# Patient Record
Sex: Male | Born: 1955 | Race: White | Hispanic: No | Marital: Single | Smoking: Current every day smoker
Health system: Southern US, Community
[De-identification: ages and names within clinical notes are randomized; demographics above are authoritative.]

## PROBLEM LIST (undated history)

## (undated) DIAGNOSIS — N179 Acute kidney failure, unspecified: Secondary | ICD-10-CM

## (undated) DIAGNOSIS — F209 Schizophrenia, unspecified: Secondary | ICD-10-CM

## (undated) DIAGNOSIS — E871 Hypo-osmolality and hyponatremia: Secondary | ICD-10-CM

## (undated) DIAGNOSIS — E876 Hypokalemia: Secondary | ICD-10-CM

## (undated) DIAGNOSIS — K5900 Constipation, unspecified: Secondary | ICD-10-CM

## (undated) DIAGNOSIS — I10 Essential (primary) hypertension: Secondary | ICD-10-CM

## (undated) DIAGNOSIS — Z9119 Patient's noncompliance with other medical treatment and regimen: Secondary | ICD-10-CM

## (undated) DIAGNOSIS — N400 Enlarged prostate without lower urinary tract symptoms: Secondary | ICD-10-CM

## (undated) DIAGNOSIS — J449 Chronic obstructive pulmonary disease, unspecified: Secondary | ICD-10-CM

## (undated) DIAGNOSIS — R0600 Dyspnea, unspecified: Secondary | ICD-10-CM

## (undated) HISTORY — PX: TONSILLECTOMY: SUR1361

---

## 2014-03-31 LAB — COMPREHENSIVE METABOLIC PANEL
ALK PHOS: 112 U/L
ALT: 25 U/L
Albumin: 3.3 g/dL — ABNORMAL LOW (ref 3.4–5.0)
Anion Gap: 7 (ref 7–16)
BUN: 14 mg/dL (ref 7–18)
Bilirubin,Total: 0.4 mg/dL (ref 0.2–1.0)
CALCIUM: 8.9 mg/dL (ref 8.5–10.1)
CHLORIDE: 109 mmol/L — AB (ref 98–107)
Co2: 29 mmol/L (ref 21–32)
Creatinine: 1.11 mg/dL (ref 0.60–1.30)
EGFR (African American): >60
EGFR (Non-African Amer.): >60
Glucose: 110 mg/dL — ABNORMAL HIGH (ref 65–99)
Osmolality: 290 (ref 275–301)
Potassium: 3.6 mmol/L (ref 3.5–5.1)
SGOT(AST): 25 U/L (ref 15–37)
Sodium: 145 mmol/L (ref 136–145)
Total Protein: 7 g/dL (ref 6.4–8.2)

## 2014-03-31 LAB — ETHANOL: Ethanol %: <0.003 % (ref 0.000–0.080)

## 2014-03-31 LAB — CBC
HCT: 46 % (ref 40.0–52.0)
HGB: 15.1 g/dL (ref 13.0–18.0)
MCH: 31.2 pg (ref 26.0–34.0)
MCHC: 32.9 g/dL (ref 32.0–36.0)
MCV: 95 fL (ref 80–100)
Platelet: 295 10*3/uL (ref 150–440)
RBC: 4.86 10*6/uL (ref 4.40–5.90)
RDW: 12.9 % (ref 11.5–14.5)
WBC: 10.2 10*3/uL (ref 3.8–10.6)

## 2014-03-31 LAB — ACETAMINOPHEN LEVEL: Acetaminophen: <2 ug/mL

## 2014-03-31 LAB — SALICYLATE LEVEL: Salicylates, Serum: 5.2 mg/dL — ABNORMAL HIGH

## 2014-04-01 ENCOUNTER — Inpatient Hospital Stay: Payer: Self-pay | Admitting: Psychiatry

## 2014-04-01 LAB — URINALYSIS, COMPLETE
BACTERIA: NONE SEEN
Bilirubin,UR: NEGATIVE
Blood: NEGATIVE
Glucose,UR: NEGATIVE mg/dL (ref 0–75)
Ketone: NEGATIVE
Leukocyte Esterase: NEGATIVE
Nitrite: NEGATIVE
PROTEIN: NEGATIVE
Ph: 7 (ref 4.5–8.0)
Specific Gravity: 1.006 (ref 1.003–1.030)
Squamous Epithelial: <1
WBC UR: NONE SEEN /HPF (ref 0–5)

## 2014-04-01 LAB — DRUG SCREEN, URINE
Amphetamines, Ur Screen: NEGATIVE (ref ?–1000)
BENZODIAZEPINE, UR SCRN: NEGATIVE (ref ?–200)
Barbiturates, Ur Screen: NEGATIVE (ref ?–200)
Cannabinoid 50 Ng, Ur ~~LOC~~: NEGATIVE (ref ?–50)
Cocaine Metabolite,Ur ~~LOC~~: NEGATIVE (ref ?–300)
MDMA (ECSTASY) UR SCREEN: NEGATIVE (ref ?–500)
Methadone, Ur Screen: NEGATIVE (ref ?–300)
OPIATE, UR SCREEN: NEGATIVE (ref ?–300)
Phencyclidine (PCP) Ur S: NEGATIVE (ref ?–25)
Tricyclic, Ur Screen: NEGATIVE (ref ?–1000)

## 2014-04-06 LAB — LIPID PANEL
Cholesterol: 120 mg/dL (ref 0–200)
HDL Cholesterol: 37 mg/dL — ABNORMAL LOW (ref 40–60)
Ldl Cholesterol, Calc: 47 mg/dL (ref 0–100)
Triglycerides: 180 mg/dL (ref 0–200)
VLDL Cholesterol, Calc: 36 mg/dL (ref 5–40)

## 2014-04-06 LAB — HEMOGLOBIN A1C: Hemoglobin A1C: 5.1 % (ref 4.2–6.3)
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
## 2017-07-16 ENCOUNTER — Emergency Department (HOSPITAL_COMMUNITY)
Admission: EM | Admit: 2017-07-16 | Discharge: 2017-07-16 | Disposition: A | Discharge status: Home / Self Care | Payer: Medicaid Other | Attending: Emergency Medicine | Admitting: Emergency Medicine

## 2017-07-16 ENCOUNTER — Encounter (HOSPITAL_COMMUNITY): Payer: Self-pay | Admitting: *Deleted

## 2017-07-16 DIAGNOSIS — E876 Hypokalemia: Secondary | ICD-10-CM | POA: Diagnosis not present

## 2017-07-16 DIAGNOSIS — I10 Essential (primary) hypertension: Secondary | ICD-10-CM | POA: Diagnosis not present

## 2017-07-16 DIAGNOSIS — F17200 Nicotine dependence, unspecified, uncomplicated: Secondary | ICD-10-CM | POA: Insufficient documentation

## 2017-07-16 DIAGNOSIS — F209 Schizophrenia, unspecified: Secondary | ICD-10-CM | POA: Insufficient documentation

## 2017-07-16 DIAGNOSIS — R4689 Other symptoms and signs involving appearance and behavior: Secondary | ICD-10-CM | POA: Diagnosis present

## 2017-07-16 DIAGNOSIS — Z79899 Other long term (current) drug therapy: Secondary | ICD-10-CM | POA: Diagnosis not present

## 2017-07-16 HISTORY — DX: Essential (primary) hypertension: I10

## 2017-07-16 HISTORY — DX: Schizophrenia, unspecified: F20.9

## 2017-07-16 LAB — CBC WITH DIFFERENTIAL/PLATELET
BASOS PCT: 1 %
Basophils Absolute: 0.1 10*3/uL (ref 0.0–0.1)
Eosinophils Absolute: 0 10*3/uL (ref 0.0–0.7)
Eosinophils Relative: 0 %
HEMATOCRIT: 39 % (ref 39.0–52.0)
HEMOGLOBIN: 13.3 g/dL (ref 13.0–17.0)
LYMPHS ABS: 1.4 10*3/uL (ref 0.7–4.0)
Lymphocytes Relative: 15 %
MCH: 33.5 pg (ref 26.0–34.0)
MCHC: 34.1 g/dL (ref 30.0–36.0)
MCV: 98.2 fL (ref 78.0–100.0)
MONOS PCT: 12 %
Monocytes Absolute: 1.1 10*3/uL — ABNORMAL HIGH (ref 0.1–1.0)
NEUTROS ABS: 6.7 10*3/uL (ref 1.7–7.7)
Neutrophils Relative %: 72 %
Platelets: 204 10*3/uL (ref 150–400)
RBC: 3.97 MIL/uL — ABNORMAL LOW (ref 4.22–5.81)
RDW: 13.2 % (ref 11.5–15.5)
WBC: 9.3 10*3/uL (ref 4.0–10.5)

## 2017-07-16 LAB — COMPREHENSIVE METABOLIC PANEL
ALBUMIN: 2.8 g/dL — AB (ref 3.5–5.0)
ALK PHOS: 96 U/L (ref 38–126)
ALT: 21 U/L (ref 17–63)
ANION GAP: 11 (ref 5–15)
AST: 29 U/L (ref 15–41)
BILIRUBIN TOTAL: 0.4 mg/dL (ref 0.3–1.2)
BUN: 24 mg/dL — ABNORMAL HIGH (ref 6–20)
CALCIUM: 8.1 mg/dL — AB (ref 8.9–10.3)
CO2: 28 mmol/L (ref 22–32)
Chloride: 93 mmol/L — ABNORMAL LOW (ref 101–111)
Creatinine, Ser: 1.1 mg/dL (ref 0.61–1.24)
GLUCOSE: 138 mg/dL — AB (ref 65–99)
POTASSIUM: 2.4 mmol/L — AB (ref 3.5–5.1)
Sodium: 132 mmol/L — ABNORMAL LOW (ref 135–145)
TOTAL PROTEIN: 5.6 g/dL — AB (ref 6.5–8.1)

## 2017-07-16 MED ORDER — POTASSIUM CHLORIDE CRYS ER 20 MEQ PO TBCR: 20.0000 meq | EXTENDED_RELEASE_TABLET | Freq: Two times a day (BID) | ORAL | 0 refills | Status: DC

## 2017-07-16 MED ORDER — POTASSIUM CHLORIDE CRYS ER 20 MEQ PO TBCR
40.0000 meq | EXTENDED_RELEASE_TABLET | Freq: Once | ORAL | Status: AC
  Administered 2017-07-16: 40 meq via ORAL
  Filled 2017-07-16: qty 2

## 2017-07-16 NOTE — ED Triage Notes (Signed)
Called [facility]-concerned about pt because pt not as active today as usual per caretaker, reported that pt did eat breakfast and lunch.  Reported pt weighted 116 last week and today 120.  Reported that pt refused colonoscopy, when asked pt states that he wants to wait til June.

## 2017-07-16 NOTE — ED Triage Notes (Signed)
[facility] brought here, pt denies any pain or complaints.  EMS did not report anything abnormal other than pt's room at group home was extremely hot.

## 2017-07-16 NOTE — Discharge Instructions (Addendum)
Patient had no specific complaints.  Potassium was noted to be low at 2.4.   I suspect the low potassium is related to the diuretic he is taking.  He was given a dose of potassium 40 mEq in the emergency department.  Will Rx a potassium prescription for 10 days.  He will need to have this rechecked.

## 2017-07-16 NOTE — ED Provider Notes (Signed)
[facility] EMERGENCY DEPARTMENT Provider Note   CSN: [id number] Arrival date & time: 07/16/17  1658     History   Chief Complaint Chief Complaint  Patient presents with  . Medicare Wellness    HPI Ray Jackson is a 61 y.o. male.  Level 5 caveat for schizophrenia.  [facility] resides at [facility] group [facility].  He apparently was not as active today and did not eat breakfast or lunch.  Staff reports a 4 pound weight gain in the past week.  He has no specific complaints in the emergency department.  No chest pain, dyspnea, fever, sweats, chills.  He simply states "I want to go home".      Past Medical History:  Diagnosis Date  . Hypertension   . Schizophrenia (HCC)     There are no active problems to display for this patient.   Past Surgical History:  Procedure Laterality Date  . TONSILLECTOMY         Home Medications    Prior to Admission medications   Medication Sig Start Date End Date Taking? Authorizing Provider  amLODipine (NORVASC) 10 MG tablet Take 10 mg by mouth daily.   Yes [provider]  benztropine (COGENTIN) 1 MG tablet Take 1 mg by mouth 2 (two) times daily.   Yes [provider]  cetirizine (ZYRTEC) 10 MG tablet Take 10 mg by mouth daily.   Yes [provider]  docusate sodium (COLACE) 100 MG capsule Take 100 mg by mouth 2 (two) times daily.   Yes [provider]  fluticasone (FLONASE) 50 MCG/ACT nasal spray Place 2 sprays into both nostrils daily.   Yes [provider]  haloperidol (HALDOL) 10 MG tablet Take 10 mg by mouth at bedtime.   Yes [provider]  haloperidol (HALDOL) 5 MG tablet Take 5 mg by mouth every 4 (four) hours as needed for agitation.   Yes [provider]  hydrochlorothiazide (HYDRODIURIL) 25 MG tablet Take 25 mg by mouth daily.   Yes [provider]  mirtazapine (REMERON) 30 MG tablet Take 30 mg by mouth at bedtime.   Yes [provider]    paliperidone (INVEGA SUSTENNA) 156 MG/ML SUSP injection Inject 156 mg into the muscle every 30 (thirty) days.   Yes [provider]  polyethylene glycol (MIRALAX / GLYCOLAX) packet Take 17 g by mouth daily.   Yes [provider]  potassium chloride (K-DUR) 10 MEQ tablet Take 10 mEq by mouth daily.   Yes [provider]  potassium chloride SA (K-DUR,KLOR-CON) 20 MEQ tablet Take 1 tablet (20 mEq total) by mouth 2 (two) times daily. 07/16/17   Donnetta Hutchingook, Clotilda Hafer, MD    Family History History reviewed. No pertinent family history.  Social History Social History   Tobacco Use  . Smoking status: Current Every Day Smoker  . Smokeless tobacco: Never Used  Substance Use Topics  . Alcohol use: No    Frequency: Never  . Drug use: No     Allergies   Patient has no known allergies.   Review of Systems Review of Systems  Unable to perform ROS: Psychiatric disorder     Physical Exam Updated Vital Signs BP 110/70   Pulse 73   Temp 99.3 F (37.4 C) (Temporal)   Resp 18   Ht 6' (1.829 m)   Wt 54.4 kg (120 lb)   SpO2 97%   BMI 16.27 kg/m   Physical Exam  Constitutional: He is oriented to person, place, and  time.  Thin, no acute distress, disheveled  HENT:  Head: Normocephalic and atraumatic.  Eyes: Conjunctivae are normal.  Neck: Neck supple.  Cardiovascular: Normal rate and regular rhythm.  Pulmonary/Chest: Effort normal and breath sounds normal.  Abdominal: Soft. Bowel sounds are normal.  Musculoskeletal: Normal range of motion.  Neurological: He is alert and oriented to person, place, and time.  Skin: Skin is warm and dry.  Psychiatric:  Flat affect, flight of ideas.  Nursing note and vitals reviewed.    ED Treatments / Results  Labs (all labs ordered are listed, but only abnormal results are displayed) Labs Reviewed  CBC WITH DIFFERENTIAL/PLATELET - Abnormal; Notable for the following components:      Result Value   RBC 3.97 (*)     Monocytes Absolute 1.1 (*)    All other components within normal limits  COMPREHENSIVE METABOLIC PANEL - Abnormal; Notable for the following components:   Sodium 132 (*)    Potassium 2.4 (*)    Chloride 93 (*)    Glucose, Bld 138 (*)    BUN 24 (*)    Calcium 8.1 (*)    Total Protein 5.6 (*)    Albumin 2.8 (*)    All other components within normal limits    EKG  EKG Interpretation None       Radiology No results found.  Procedures Procedures (including critical care time)  Medications Ordered in ED Medications  potassium chloride SA (K-DUR,KLOR-CON) CR tablet 40 mEq (40 mEq Oral Given 07/16/17 1856)     Initial Impression / Assessment and Plan / ED Course  I have reviewed the triage vital signs and the nursing notes.  Pertinent labs & imaging results that were available during my care of the patient were reviewed by me and considered in my medical decision making (see chart for details).    Potassium was noted to be 2.4.    Patient was given potassium 40 mEq in the emergency department.  Will discharge home on potassium 20 mg bid.  He will need to have his potassium level rechecked within the next 10 days.   Final Clinical Impressions(s) / ED Diagnoses   Final diagnoses:  Schizophrenia, unspecified type (HCC)  Hypokalemia    ED Discharge Orders        Ordered    potassium chloride SA (K-DUR,KLOR-CON) 20 MEQ tablet  2 times daily     07/16/17 1925       Donnetta Hutchingook, Deiondra Denley, MD 07/16/17 1927

## 2017-07-16 NOTE — ED Notes (Signed)
T/c to [facility] facility, advised pt ready for discharge, staff advised they will send transportation to pick up pt

## 2017-07-16 NOTE — ED Notes (Signed)
Facility arrived for transportation, reviewed discharge instructions with staff

## 2017-07-16 NOTE — ED Notes (Signed)
CRITICAL VALUE ALERT  Critical Value:  Potassium 2.4  Date & Time Notied:  07/16/2017, [id number]  Provider Notified: Dr. Roselyn BeringJ Knapp  Orders Received/Actions taken: po potassium ordered

## 2017-08-05 ENCOUNTER — Emergency Department (HOSPITAL_COMMUNITY)
Admission: EM | Admit: 2017-08-05 | Discharge: 2017-08-05 | Disposition: A | Discharge status: Home / Self Care | Payer: Medicaid Other | Attending: Emergency Medicine | Admitting: Emergency Medicine

## 2017-08-05 ENCOUNTER — Encounter (HOSPITAL_COMMUNITY): Payer: Self-pay | Admitting: *Deleted

## 2017-08-05 ENCOUNTER — Emergency Department (HOSPITAL_COMMUNITY): Payer: Medicaid Other

## 2017-08-05 ENCOUNTER — Other Ambulatory Visit: Payer: Self-pay

## 2017-08-05 DIAGNOSIS — L03114 Cellulitis of left upper limb: Secondary | ICD-10-CM | POA: Insufficient documentation

## 2017-08-05 DIAGNOSIS — L02414 Cutaneous abscess of left upper limb: Secondary | ICD-10-CM | POA: Insufficient documentation

## 2017-08-05 DIAGNOSIS — E876 Hypokalemia: Secondary | ICD-10-CM | POA: Diagnosis not present

## 2017-08-05 DIAGNOSIS — Z79899 Other long term (current) drug therapy: Secondary | ICD-10-CM | POA: Diagnosis not present

## 2017-08-05 DIAGNOSIS — I10 Essential (primary) hypertension: Secondary | ICD-10-CM | POA: Insufficient documentation

## 2017-08-05 DIAGNOSIS — F17200 Nicotine dependence, unspecified, uncomplicated: Secondary | ICD-10-CM | POA: Diagnosis not present

## 2017-08-05 DIAGNOSIS — R2232 Localized swelling, mass and lump, left upper limb: Secondary | ICD-10-CM | POA: Diagnosis present

## 2017-08-05 DIAGNOSIS — L0291 Cutaneous abscess, unspecified: Secondary | ICD-10-CM

## 2017-08-05 LAB — BASIC METABOLIC PANEL
Anion gap: 9 (ref 5–15)
BUN: 20 mg/dL (ref 6–20)
CO2: 29 mmol/L (ref 22–32)
CREATININE: 1.04 mg/dL (ref 0.61–1.24)
Calcium: 8.5 mg/dL — ABNORMAL LOW (ref 8.9–10.3)
Chloride: 98 mmol/L — ABNORMAL LOW (ref 101–111)
GFR calc Af Amer: >60 mL/min (ref >60)
GLUCOSE: 102 mg/dL — AB (ref 65–99)
POTASSIUM: 2.7 mmol/L — AB (ref 3.5–5.1)
SODIUM: 136 mmol/L (ref 135–145)

## 2017-08-05 LAB — CBC WITH DIFFERENTIAL/PLATELET
Basophils Absolute: 0.1 10*3/uL (ref 0.0–0.1)
Basophils Relative: 1 %
EOS ABS: 0.1 10*3/uL (ref 0.0–0.7)
EOS PCT: 1 %
HCT: 36.9 % — ABNORMAL LOW (ref 39.0–52.0)
Hemoglobin: 13 g/dL (ref 13.0–17.0)
LYMPHS ABS: 3.2 10*3/uL (ref 0.7–4.0)
LYMPHS PCT: 42 %
MCH: 33.7 pg (ref 26.0–34.0)
MCHC: 35.2 g/dL (ref 30.0–36.0)
MCV: 95.6 fL (ref 78.0–100.0)
MONO ABS: 0.6 10*3/uL (ref 0.1–1.0)
MONOS PCT: 8 %
Neutro Abs: 3.8 10*3/uL (ref 1.7–7.7)
Neutrophils Relative %: 48 %
PLATELETS: 225 10*3/uL (ref 150–400)
RBC: 3.86 MIL/uL — AB (ref 4.22–5.81)
RDW: 12.9 % (ref 11.5–15.5)
WBC: 7.8 10*3/uL (ref 4.0–10.5)

## 2017-08-05 LAB — MAGNESIUM: MAGNESIUM: 1.9 mg/dL (ref 1.7–2.4)

## 2017-08-05 MED ORDER — POTASSIUM CHLORIDE 20 MEQ PO PACK
80.0000 meq | PACK | Freq: Once | ORAL | Status: AC
  Administered 2017-08-05: 80 meq via ORAL
  Filled 2017-08-05: qty 4

## 2017-08-05 MED ORDER — LIDOCAINE-EPINEPHRINE-TETRACAINE (LET) SOLUTION
3.0000 mL | Freq: Once | NASAL | Status: AC
  Administered 2017-08-05: 20:00:00 3 mL via TOPICAL
  Filled 2017-08-05: qty 3

## 2017-08-05 MED ORDER — SULFAMETHOXAZOLE-TRIMETHOPRIM 800-160 MG PO TABS: 1.0000 | ORAL_TABLET | Freq: Two times a day (BID) | ORAL | 0 refills | Status: AC

## 2017-08-05 MED ORDER — SULFAMETHOXAZOLE-TRIMETHOPRIM 800-160 MG PO TABS
1.0000 | ORAL_TABLET | Freq: Once | ORAL | Status: AC
  Administered 2017-08-05: 1 via ORAL
  Filled 2017-08-05: qty 1

## 2017-08-05 MED ORDER — LIDOCAINE-EPINEPHRINE 2 %-1:100000 IJ SOLN
20.0000 mL | Freq: Once | INTRAMUSCULAR | Status: AC
  Administered 2017-08-05: 20 mL via INTRADERMAL
  Filled 2017-08-05: qty 20

## 2017-08-05 MED ORDER — LIDOCAINE-EPINEPHRINE (PF) 2 %-1:200000 IJ SOLN
INTRAMUSCULAR | Status: AC
  Administered 2017-08-05: 20:00:00
  Filled 2017-08-05: qty 20

## 2017-08-05 MED ORDER — POTASSIUM CHLORIDE CRYS ER 20 MEQ PO TBCR: 40.0000 meq | EXTENDED_RELEASE_TABLET | Freq: Every day | ORAL | 0 refills | Status: DC

## 2017-08-05 NOTE — ED Notes (Signed)
Date and time results received: 08/05/17 2105 (use smartphrase ".now" to insert current time)  Test: Potassium Critical Value: 2.7  Name of Provider Notified: Dr. Verdie MosherLiu at 2106  Orders Received? Or Actions Taken?: Dr. Verdie MosherLiu requested for pt to have a Magnesium level added; orders completed

## 2017-08-05 NOTE — ED Triage Notes (Signed)
Pt brought in by [facility] for c/o possible spider bite to left elbow x 2 weeks; pt states he has got some green drainage out of wound; area is red with black scabbed area

## 2017-08-05 NOTE — Discharge Instructions (Signed)
You have an infection of the skin of the left arm. Please take antibiotics as prescribed. Please return without fail for worsening symptoms, including fever, worsening redness/swelling of the arm, worsening pain, or any other symptoms concerning to you.

## 2017-08-05 NOTE — ED Provider Notes (Signed)
[facility] EMERGENCY DEPARTMENT Provider Note   CSN: [id number] Arrival date & time: 08/05/17  1941     History   Chief Complaint Chief Complaint  Patient presents with  . Abscess    HPI Ray Jackson is a 61 y.o. male.  HPI 61 year old male who presents with redness and swelling to the left elbow.  This is been ongoing for 2 weeks.  He is unsure of what originally caused it.  Unsure if it could be insect bite.  States that it initially was very itchy, and he scratched an area that scabbed over.  Since then he has been able to push small amounts of purulent drainage from under the scab.  Denies any fevers, chills, nausea or vomiting, numbness or weakness, difficulty breathing, or chest pain.   Past Medical History:  Diagnosis Date  . Hypertension   . Schizophrenia (HCC)     There are no active problems to display for this patient.   Past Surgical History:  Procedure Laterality Date  . TONSILLECTOMY         Home Medications    Prior to Admission medications   Medication Sig Start Date End Date Taking? Authorizing Provider  amLODipine (NORVASC) 10 MG tablet Take 10 mg by mouth daily.    [provider]  benztropine (COGENTIN) 1 MG tablet Take 1 mg by mouth 2 (two) times daily.    [provider]  cetirizine (ZYRTEC) 10 MG tablet Take 10 mg by mouth daily.    [provider]  docusate sodium (COLACE) 100 MG capsule Take 100 mg by mouth 2 (two) times daily.    [provider]  fluticasone (FLONASE) 50 MCG/ACT nasal spray Place 2 sprays into both nostrils daily.    [provider]  haloperidol (HALDOL) 10 MG tablet Take 10 mg by mouth at bedtime.    [provider]  haloperidol (HALDOL) 5 MG tablet Take 5 mg by mouth every 4 (four) hours as needed for agitation.    [provider]  hydrochlorothiazide (HYDRODIURIL) 25 MG tablet Take 25 mg by mouth daily.    [provider]  mirtazapine (REMERON) 30  MG tablet Take 30 mg by mouth at bedtime.    [provider]  paliperidone (INVEGA SUSTENNA) 156 MG/ML SUSP injection Inject 156 mg into the muscle every 30 (thirty) days.    [provider]  polyethylene glycol (MIRALAX / GLYCOLAX) packet Take 17 g by mouth daily.    [provider]  potassium chloride (K-DUR) 10 MEQ tablet Take 10 mEq by mouth daily.    [provider]  potassium chloride SA (K-DUR,KLOR-CON) 20 MEQ tablet Take 1 tablet (20 mEq total) by mouth 2 (two) times daily. 07/16/17   Donnetta Hutchingook, Brian, MD  potassium chloride SA (K-DUR,KLOR-CON) 20 MEQ tablet Take 2 tablets (40 mEq total) by mouth daily. 08/05/17   Lavera GuiseLiu, Cova Knieriem Duo, MD  sulfamethoxazole-trimethoprim (BACTRIM DS,SEPTRA DS) 800-160 MG tablet Take 1 tablet by mouth 2 (two) times daily for 7 days. 08/05/17 08/12/17  Lavera GuiseLiu, Topaz Raglin Duo, MD    Family History History reviewed. No pertinent family history.  Social History Social History   Tobacco Use  . Smoking status: Current Every Day Smoker    Packs/day: 0.50  . Smokeless tobacco: Never Used  Substance Use Topics  . Alcohol use: No    Frequency: Never  . Drug use: No     Allergies   Patient has no known allergies.   Review of Systems  Review of Systems  Constitutional: Negative for fever.  Respiratory: Negative for shortness of breath.   Cardiovascular: Negative for chest pain.  Gastrointestinal: Negative for vomiting.  All other systems reviewed and are negative.    Physical Exam Updated Vital Signs BP 125/83   Pulse 64   Temp 98.4 F (36.9 C) (Oral)   Resp 19   Ht 6' (1.829 m)   Wt 54.4 kg (120 lb)   SpO2 97%   BMI 16.27 kg/m   Physical Exam Physical Exam  Nursing note and vitals reviewed. Constitutional: Well developed, well nourished, non-toxic, and in no acute distress Head: Normocephalic and atraumatic.  Mouth/Throat: Oropharynx is clear and moist.  Neck: Normal range of motion. Neck supple.  Cardiovascular:  Normal rate and regular rhythm.   Pulmonary/Chest: Effort normal and breath sounds normal.  Abdominal: Soft. There is no tenderness. There is no rebound and no guarding.  Musculoskeletal: Normal range of motion.  Neurological: Alert, no facial droop, fluent speech, moves all extremities symmetrically Skin: Skin is warm and dry. Area of erythema overlying medial aspect the elbow. Skin appears indurated, dry. Non-tender. 2 x 1 cm scab overlying when pressure applied with scant purlent drainage.  Psychiatric: Cooperative   ED Treatments / Results  Labs (all labs ordered are listed, but only abnormal results are displayed) Labs Reviewed  CBC WITH DIFFERENTIAL/PLATELET - Abnormal; Notable for the following components:      Result Value   RBC 3.86 (*)    HCT 36.9 (*)    All other components within normal limits  BASIC METABOLIC PANEL - Abnormal; Notable for the following components:   Potassium 2.7 (*)    Chloride 98 (*)    Glucose, Bld 102 (*)    Calcium 8.5 (*)    All other components within normal limits  MAGNESIUM    EKG  EKG Interpretation None       Radiology Dg Elbow 2 Views Left  Result Date: 08/05/2017 CLINICAL DATA:  Left elbow redness and swelling EXAM: LEFT ELBOW - 2 VIEW COMPARISON:  None. FINDINGS: There is no evidence of fracture, dislocation, or joint effusion. There is no evidence of arthropathy or other focal bone abnormality. Soft tissues are unremarkable. IMPRESSION: Negative. Electronically Signed   By: Marlan Palauharles  Clark M.D.   On: 08/05/2017 20:45    Procedures .Marland Kitchen.Incision and Drainage Date/Time: 08/05/2017 10:01 PM Performed by: Lavera GuiseLiu, Wilmarie Sparlin Duo, MD Authorized by: Lavera GuiseLiu, Arafat Cocuzza Duo, MD   Consent:    Consent obtained:  Verbal   Consent given by:  Patient   Risks discussed:  Incomplete drainage, bleeding, infection and pain   Alternatives discussed:  No treatment Location:    Type:  Abscess   Size:  2 x 1 cm   Location:  Upper extremity   Upper extremity  location:  Elbow   Elbow location:  L elbow Pre-procedure details:    Skin preparation:  Betadine Anesthesia (see MAR for exact dosages):    Anesthesia method:  Local infiltration   Local anesthetic:  Lidocaine 2% WITH epi Procedure type:    Complexity:  Simple Procedure details:    Needle aspiration: no     Incision types:  Single straight   Incision depth:  Submucosal   Scalpel blade:  10   Drainage:  Purulent   Drainage amount:  Scant   Wound treatment:  Wound left open   Packing materials:  None Post-procedure details:    Patient tolerance of procedure:  Tolerated well, no immediate complications   (  including critical care time)  Medications Ordered in ED Medications  lidocaine-EPINEPHrine-tetracaine (LET) solution (3 mLs Topical Given 08/05/17 2017)  lidocaine-EPINEPHrine (XYLOCAINE W/EPI) 2 %-1:100000 (with pres) injection 20 mL (20 mLs Intradermal Given 08/05/17 2018)  lidocaine-EPINEPHrine (XYLOCAINE W/EPI) 2 %-1:200000 (PF) injection (  Given 08/05/17 2019)  sulfamethoxazole-trimethoprim (BACTRIM DS,SEPTRA DS) 800-160 MG per tablet 1 tablet (1 tablet Oral Given 08/05/17 2057)  potassium chloride (KLOR-CON) packet 80 mEq (80 mEq Oral Given 08/05/17 2120)     Initial Impression / Assessment and Plan / ED Course  I have reviewed the triage vital signs and the nursing notes.  Pertinent labs & imaging results that were available during my care of the patient were reviewed by me and considered in my medical decision making (see chart for details).     Patient with evidence of cellulitis and small abscess to the left elbow.  Over the medial aspect.  Normal range of motion.  Do not suspect septic arthritis.  Presentation not consistent with septic bursitis either.  Blood work reassuring.  X-ray without underlying bony changes.  Modified incision and drainage was performed.  The overlying scab was removed, and underneath was some purulent drainage.  No deeper abscess was felt  on exam.  He will be given a course of Bactrim for treatment of overlying cellulitis.  Blood work also notable for hypokalemia of 2.7.  Normal mag and magnesium.  No acute EKG changes.  This is likely related to his hydrochlorothiazide.  He is given repletion here and prescription for potassium repletion is refilled for him.   Strict return and follow-up instructions reviewed. He expressed understanding of all discharge instructions and felt comfortable with the plan of care.   Final Clinical Impressions(s) / ED Diagnoses   Final diagnoses:  Cellulitis of left upper extremity  Abscess  Hypokalemia    ED Discharge Orders        Ordered    sulfamethoxazole-trimethoprim (BACTRIM DS,SEPTRA DS) 800-160 MG tablet  2 times daily     08/05/17 2054    potassium chloride SA (K-DUR,KLOR-CON) 20 MEQ tablet  Daily     08/05/17 2200       [name], MD 08/05/17 2205

## 2017-08-10 ENCOUNTER — Emergency Department (HOSPITAL_COMMUNITY): Payer: Medicaid Other

## 2017-08-10 ENCOUNTER — Emergency Department (HOSPITAL_COMMUNITY)
Admission: EM | Admit: 2017-08-10 | Discharge: 2017-08-15 | Disposition: A | Discharge status: Other Acute Inpatient Hospital | Payer: Medicaid Other | Attending: Emergency Medicine | Admitting: Emergency Medicine

## 2017-08-10 ENCOUNTER — Other Ambulatory Visit: Payer: Self-pay

## 2017-08-10 ENCOUNTER — Encounter (HOSPITAL_COMMUNITY): Payer: Self-pay | Admitting: Emergency Medicine

## 2017-08-10 DIAGNOSIS — F17200 Nicotine dependence, unspecified, uncomplicated: Secondary | ICD-10-CM | POA: Insufficient documentation

## 2017-08-10 DIAGNOSIS — F209 Schizophrenia, unspecified: Secondary | ICD-10-CM | POA: Insufficient documentation

## 2017-08-10 DIAGNOSIS — F329 Major depressive disorder, single episode, unspecified: Secondary | ICD-10-CM | POA: Insufficient documentation

## 2017-08-10 DIAGNOSIS — Z79899 Other long term (current) drug therapy: Secondary | ICD-10-CM | POA: Diagnosis not present

## 2017-08-10 DIAGNOSIS — R451 Restlessness and agitation: Secondary | ICD-10-CM | POA: Insufficient documentation

## 2017-08-10 DIAGNOSIS — I10 Essential (primary) hypertension: Secondary | ICD-10-CM | POA: Insufficient documentation

## 2017-08-10 HISTORY — DX: Constipation, unspecified: K59.00

## 2017-08-10 HISTORY — DX: Hypokalemia: E87.6

## 2017-08-10 LAB — COMPREHENSIVE METABOLIC PANEL
ALBUMIN: 2.9 g/dL — AB (ref 3.5–5.0)
ALK PHOS: 91 U/L (ref 38–126)
ALT: 16 U/L — ABNORMAL LOW (ref 17–63)
ANION GAP: 13 (ref 5–15)
AST: 33 U/L (ref 15–41)
BILIRUBIN TOTAL: 0.5 mg/dL (ref 0.3–1.2)
BUN: 24 mg/dL — AB (ref 6–20)
CALCIUM: 8.8 mg/dL — AB (ref 8.9–10.3)
CO2: 21 mmol/L — AB (ref 22–32)
Chloride: 96 mmol/L — ABNORMAL LOW (ref 101–111)
Creatinine, Ser: 1.35 mg/dL — ABNORMAL HIGH (ref 0.61–1.24)
GFR calc Af Amer: >60 mL/min (ref >60)
GFR calc non Af Amer: 55 mL/min — ABNORMAL LOW (ref >60)
GLUCOSE: 111 mg/dL — AB (ref 65–99)
Potassium: 4.1 mmol/L (ref 3.5–5.1)
SODIUM: 130 mmol/L — AB (ref 135–145)
TOTAL PROTEIN: 6.3 g/dL — AB (ref 6.5–8.1)

## 2017-08-10 LAB — URINALYSIS, ROUTINE W REFLEX MICROSCOPIC
BILIRUBIN URINE: NEGATIVE
GLUCOSE, UA: NEGATIVE mg/dL
HGB URINE DIPSTICK: NEGATIVE
KETONES UR: NEGATIVE mg/dL
Leukocytes, UA: NEGATIVE
Nitrite: NEGATIVE
PROTEIN: NEGATIVE mg/dL
Specific Gravity, Urine: 1.008 (ref 1.005–1.030)
pH: 7 (ref 5.0–8.0)

## 2017-08-10 LAB — BASIC METABOLIC PANEL
Anion gap: 7 (ref 5–15)
BUN: 23 mg/dL — AB (ref 6–20)
CALCIUM: 8.3 mg/dL — AB (ref 8.9–10.3)
CHLORIDE: 99 mmol/L — AB (ref 101–111)
CO2: 27 mmol/L (ref 22–32)
CREATININE: 1.25 mg/dL — AB (ref 0.61–1.24)
GFR calc Af Amer: >60 mL/min (ref >60)
GFR calc non Af Amer: >60 mL/min (ref >60)
GLUCOSE: 89 mg/dL (ref 65–99)
Potassium: 4.1 mmol/L (ref 3.5–5.1)
Sodium: 133 mmol/L — ABNORMAL LOW (ref 135–145)

## 2017-08-10 LAB — RAPID URINE DRUG SCREEN, HOSP PERFORMED
Amphetamines: NOT DETECTED
BARBITURATES: NOT DETECTED
BENZODIAZEPINES: NOT DETECTED
COCAINE: NOT DETECTED
Opiates: NOT DETECTED
TETRAHYDROCANNABINOL: NOT DETECTED

## 2017-08-10 LAB — I-STAT CG4 LACTIC ACID, ED
Lactic Acid, Venous: 3.34 mmol/L (ref 0.5–1.9)
Lactic Acid, Venous: 0.71 mmol/L (ref 0.5–1.9)

## 2017-08-10 LAB — CBC WITH DIFFERENTIAL/PLATELET
BASOS ABS: 0 10*3/uL (ref 0.0–0.1)
BASOS PCT: 0 %
EOS PCT: 2 %
Eosinophils Absolute: 0.1 10*3/uL (ref 0.0–0.7)
HCT: 40.2 % (ref 39.0–52.0)
Hemoglobin: 13.7 g/dL (ref 13.0–17.0)
Lymphocytes Relative: 26 %
Lymphs Abs: 1.9 10*3/uL (ref 0.7–4.0)
MCH: 32.9 pg (ref 26.0–34.0)
MCHC: 34.1 g/dL (ref 30.0–36.0)
MCV: 96.4 fL (ref 78.0–100.0)
MONO ABS: 0.5 10*3/uL (ref 0.1–1.0)
Monocytes Relative: 7 %
NEUTROS ABS: 4.7 10*3/uL (ref 1.7–7.7)
Neutrophils Relative %: 65 %
PLATELETS: 236 10*3/uL (ref 150–400)
RBC: 4.17 MIL/uL — AB (ref 4.22–5.81)
RDW: 13 % (ref 11.5–15.5)
WBC: 7.2 10*3/uL (ref 4.0–10.5)

## 2017-08-10 LAB — CBG MONITORING, ED: Glucose-Capillary: 122 mg/dL — ABNORMAL HIGH (ref 65–99)

## 2017-08-10 LAB — SALICYLATE LEVEL

## 2017-08-10 LAB — ACETAMINOPHEN LEVEL

## 2017-08-10 LAB — POC OCCULT BLOOD, ED: Fecal Occult Bld: NEGATIVE

## 2017-08-10 LAB — ETHANOL: Alcohol, Ethyl (B): <10 mg/dL (ref <10)

## 2017-08-10 MED ORDER — RISPERIDONE 0.5 MG PO TABS
0.5000 mg | ORAL_TABLET | Freq: Every day | ORAL | Status: DC
  Administered 2017-08-10 – 2017-08-15 (×5): 0.5 mg via ORAL
  Filled 2017-08-10 (×6): qty 1

## 2017-08-10 MED ORDER — NICOTINE 21 MG/24HR TD PT24
21.0000 mg | MEDICATED_PATCH | Freq: Every day | TRANSDERMAL | Status: DC
  Administered 2017-08-10 – 2017-08-13 (×4): 21 mg via TRANSDERMAL
  Filled 2017-08-10 (×4): qty 1

## 2017-08-10 MED ORDER — SODIUM CHLORIDE 0.9 % IV BOLUS (SEPSIS)
1000.0000 mL | Freq: Once | INTRAVENOUS | Status: AC
Start: 2017-08-10 — End: 2017-08-10
  Administered 2017-08-10: 1000 mL via INTRAVENOUS

## 2017-08-10 MED ORDER — BENZTROPINE MESYLATE 1 MG PO TABS
0.5000 mg | ORAL_TABLET | Freq: Every day | ORAL | Status: DC
  Administered 2017-08-10 – 2017-08-15 (×6): 0.5 mg via ORAL
  Filled 2017-08-10 (×6): qty 1

## 2017-08-10 MED ORDER — ACETAMINOPHEN 325 MG PO TABS
650.0000 mg | ORAL_TABLET | ORAL | Status: DC | PRN
Start: 2017-08-10 — End: 2017-08-15
  Administered 2017-08-10: 650 mg via ORAL
  Filled 2017-08-10: qty 2

## 2017-08-10 MED ORDER — ZOLPIDEM TARTRATE 5 MG PO TABS
5.0000 mg | ORAL_TABLET | Freq: Every evening | ORAL | Status: DC | PRN
Start: 2017-08-10 — End: 2017-08-15
  Administered 2017-08-10 – 2017-08-15 (×5): 5 mg via ORAL
  Filled 2017-08-10 (×5): qty 1

## 2017-08-10 MED ORDER — ONDANSETRON HCL 4 MG PO TABS: 4.0000 mg | ORAL_TABLET | Freq: Three times a day (TID) | ORAL | Status: DC | PRN

## 2017-08-10 MED ORDER — HYDROXYZINE HCL 25 MG PO TABS
25.0000 mg | ORAL_TABLET | Freq: Two times a day (BID) | ORAL | Status: DC | PRN
  Administered 2017-08-10 – 2017-08-14 (×4): 25 mg via ORAL
  Filled 2017-08-10 (×5): qty 1

## 2017-08-10 MED ORDER — ALUM & MAG HYDROXIDE-SIMETH 200-200-20 MG/5ML PO SUSP: 30.0000 mL | Freq: Four times a day (QID) | ORAL | Status: DC | PRN

## 2017-08-10 NOTE — ED Triage Notes (Signed)
[facility] from [facility] [phone]). Staff at [facility] reports that pt was angry this morning and punching the walls in his room and states he was suicidal and wanted to stab his eyes out or stab himself today. PT was recently seen and started on an antibiotic for left upper arm cellulitis.

## 2017-08-10 NOTE — ED Provider Notes (Signed)
[facility] EMERGENCY DEPARTMENT Provider Note   CSN: [id number] Arrival date & time: 08/10/17  0945     History   Chief Complaint Chief Complaint  Patient presents with  . V70.1    HPI Kainoa Swoboda is a 61 y.o. male.  HPI  The patient is a 61 year old male who comes from a group home where he has been living for several years, he was seen recently in the emergency department in December approximately 5 days ago when he was treated for what appeared to be an abscess to his left antecubital fossa.  At the time he underwent incision and drainage, there was a scant amount of purulent drainage, the wound was left open and he was started on Bactrim which she has been taking.  The call went out from this facility today to paramedic transport that the patient was having some complaints including that he wanted to punch his eyes out, he was punching the walls, he was stating that he was suicidal, he did not have a specific plan for suicide.  The patient reports that he is losing weight rapidly however the staff at the nursing facility reported a month ago that he was chronically malnourished and cachectic and had gained 4 pounds at that time.  The patient specifically denies chest pain shortness of breath coughing abdominal pain diarrhea dysuria blood in the stools headache or visual changes.  He does not feel generally weak.  He feels like the redness on his arm is about stable if not slightly improving and he has not had fevers or chills.  He does report increasing amounts of agitation stating that he has had some suicidal thoughts but denies any specific plans.  He does not feel violent towards others.  The patient also states that he does not eat very much because he has difficulty with swallowing citing the fact that he has pain and difficulty with swallowing.  This is not a new problem but appears chronic according to his report.  He denies any history of cancer.  Past Medical History:    Diagnosis Date  . Constipation   . Hypertension   . Hypokalemia   . Schizophrenia (HCC)     There are no active problems to display for this patient.   Past Surgical History:  Procedure Laterality Date  . TONSILLECTOMY         Home Medications    Prior to Admission medications   Medication Sig Start Date End Date Taking? Authorizing Provider  benztropine (COGENTIN) 1 MG tablet Take 1 mg by mouth 2 (two) times daily.   Yes [provider]  Cholecalciferol (VITAMIN D3) 2000 units capsule Take 2,000 Units by mouth daily.   Yes [provider]  fluticasone (FLONASE) 50 MCG/ACT nasal spray Place 2 sprays into both nostrils daily.   Yes [provider]  haloperidol (HALDOL) 10 MG tablet Take 10 mg by mouth at bedtime.   Yes [provider]  haloperidol (HALDOL) 5 MG tablet Take 5 mg by mouth every 4 (four) hours as needed for agitation.   Yes [provider]  potassium chloride SA (K-DUR,KLOR-CON) 20 MEQ tablet Take 2 tablets (40 mEq total) by mouth daily. 08/05/17  Yes [name], MD  sulfamethoxazole-trimethoprim (BACTRIM DS,SEPTRA DS) 800-160 MG tablet Take 1 tablet by mouth 2 (two) times daily for 7 days. 08/05/17 08/12/17 Yes [name], MD  traZODone (DESYREL) 100 MG tablet Take 200 mg by mouth at bedtime.   Yes  [provider]  amLODipine (NORVASC) 10 MG tablet Take 10 mg by mouth daily.    [provider]  cetirizine (ZYRTEC) 10 MG tablet Take 10 mg by mouth daily.    [provider]  docusate sodium (COLACE) 100 MG capsule Take 100 mg by mouth 2 (two) times daily.    [provider]  hydrochlorothiazide (HYDRODIURIL) 25 MG tablet Take 25 mg by mouth daily.    [provider]  mirtazapine (REMERON) 30 MG tablet Take 30 mg by mouth at bedtime.    [provider]  paliperidone (INVEGA SUSTENNA) 156 MG/ML SUSP injection Inject 156 mg into the muscle every 30 (thirty) days.     [provider]  polyethylene glycol (MIRALAX / GLYCOLAX) packet Take 17 g by mouth daily.    [provider]    Family History History reviewed. No pertinent family history.  Social History Social History   Tobacco Use  . Smoking status: Current Every Day Smoker    Packs/day: 0.50  . Smokeless tobacco: Never Used  Substance Use Topics  . Alcohol use: No    Frequency: Never  . Drug use: No     Allergies   Patient has no known allergies.   Review of Systems Review of Systems  All other systems reviewed and are negative.    Physical Exam Updated Vital Signs BP 93/63   Pulse 66   Temp 97.7 F (36.5 C) (Rectal)   Resp 14   Ht 6' (1.829 m)   Wt 52.2 kg (115 lb)   SpO2 98%   BMI 15.60 kg/m   Physical Exam  Constitutional: No distress.  The patient has diffuse muscle wasting and cachectic appearance, he is slightly disheveled  HENT:  Head: Normocephalic and atraumatic.  Mouth/Throat: Oropharynx is clear and moist. No oropharyngeal exudate.  Eyes: Conjunctivae and EOM are normal. Pupils are equal, round, and reactive to light. Right eye exhibits no discharge. Left eye exhibits no discharge. No scleral icterus.  Neck: Normal range of motion. Neck supple. No JVD present. No thyromegaly present.  Cardiovascular: Normal rate, regular rhythm, normal heart sounds and intact distal pulses. Exam reveals no gallop and no friction rub.  No murmur heard. Pulmonary/Chest: Effort normal and breath sounds normal. No respiratory distress. He has no wheezes. He has no rales.  There is no increased work of breathing, no rales rhonchi or wheezing, symmetrical generally weak pulses at the radial arteries, there is no edema or JVD  Abdominal: Soft. Bowel sounds are normal. He exhibits no distension and no mass. There is no tenderness.  Cachectic abdomen, no abdominal tenderness or masses  Genitourinary:  Genitourinary Comments: Rectum inspected, normal-appearing brown  stool, no hemorrhoids or fissures, no masses palpated  Musculoskeletal: Normal range of motion. He exhibits no edema or tenderness.  Severe muscle wasting, no obvious deformities, no sacral decubiti  Lymphadenopathy:    He has no cervical adenopathy.  Neurological: He is alert. Coordination normal.  Skin: Skin is warm and dry. There is erythema.  Erythema present to the left antecubital fossa, minimal, nontender, no fluctuance  Psychiatric: He has a normal mood and affect. His behavior is normal.  The patient is very cooperative, happy appearing, he does not appear to be actively hallucinating, he does endorse suicidality.  Denies substance abuse at this time  Nursing note and vitals reviewed.    ED Treatments / Results  Labs (all labs ordered are listed, but only abnormal results are displayed) Labs Reviewed  COMPREHENSIVE METABOLIC PANEL - Abnormal; Notable for the following components:      Result Value   Sodium 130 (*)    Chloride 96 (*)    CO2 21 (*)    Glucose, Bld 111 (*)    BUN 24 (*)    Creatinine, Ser 1.35 (*)    Calcium 8.8 (*)    Total Protein 6.3 (*)    Albumin 2.9 (*)    ALT 16 (*)    GFR calc non Af Amer 55 (*)    All other components within normal limits  CBC WITH DIFFERENTIAL/PLATELET - Abnormal; Notable for the following components:   RBC 4.17 (*)    All other components within normal limits  ACETAMINOPHEN LEVEL - Abnormal; Notable for the following components:   Acetaminophen (Tylenol), Serum <10 (*)    All other components within normal limits  BASIC METABOLIC PANEL - Abnormal; Notable for the following components:   Sodium 133 (*)    Chloride 99 (*)    BUN 23 (*)    Creatinine, Ser 1.25 (*)    Calcium 8.3 (*)    All other components within normal limits  CBG MONITORING, ED - Abnormal; Notable for the following components:   Glucose-Capillary 122 (*)    All other components within normal limits  I-STAT CG4 LACTIC ACID, ED - Abnormal; Notable for the  following components:   Lactic Acid, Venous 3.34 (*)    All other components within normal limits  URINE CULTURE  ETHANOL  RAPID URINE DRUG SCREEN, HOSP PERFORMED  SALICYLATE LEVEL  URINALYSIS, ROUTINE W REFLEX MICROSCOPIC  POC OCCULT BLOOD, ED  I-STAT CG4 LACTIC ACID, ED    EKG  EKG Interpretation  Date/Time:  Saturday August 10 2017 10:03:57 EST Ventricular Rate:  90 PR Interval:    QRS Duration: 75 QT Interval:  349 QTC Calculation: 427 R Axis:   71 Text Interpretation:  Sinus rhythm Anterior infarct, old Nonspecific T abnormalities, lateral leads Since last tracing rate faster Confirmed by Eber HongMiller, Mavin Dyke ([id number]) on 08/10/2017 11:36:27 AM       Radiology Dg Chest 2 View  Result Date: 08/10/2017 CLINICAL DATA:  Cough. EXAM: CHEST  2 VIEW COMPARISON:  None. FINDINGS: Cardiac silhouette is normal in size. No mediastinal or hilar masses or convincing adenopathy. Lungs are hyperexpanded. There are prominent bronchovascular markings. The well-defined inches inches blood lies peripherally in the right mid lung. There is no evidence of pneumonia or pulmonary edema. No pleural effusion or pneumothorax. Skeletal structures are demineralized but grossly intact. IMPRESSION: 1. No acute cardiopulmonary disease. 2. COPD. Electronically Signed   By: Amie Portlandavid  Ormond M.D.   On: 08/10/2017 10:52    Procedures Procedures (including critical care time)  Medications Ordered in ED Medications  acetaminophen (TYLENOL) tablet 650 mg (650 mg Oral Given 08/10/17 2157)  zolpidem (AMBIEN) tablet 5 mg (5 mg Oral Given 08/10/17 2156)  ondansetron (ZOFRAN) tablet 4 mg (not administered)  alum & mag hydroxide-simeth (MAALOX/MYLANTA) 200-200-20 MG/5ML suspension 30 mL (not administered)  nicotine (NICODERM CQ - dosed in mg/24 hours) patch 21 mg (21 mg Transdermal Patch Applied 08/10/17 1119)  risperiDONE (RISPERDAL) tablet 0.5 mg (0.5 mg Oral Given 08/10/17 2156)  hydrOXYzine (ATARAX/VISTARIL) tablet  25 mg (25 mg Oral Given 08/10/17 2157)  benztropine (COGENTIN) tablet 0.5 mg (0.5 mg Oral Given 08/10/17 1340)  loperamide (IMODIUM) capsule 2 mg (2 mg Oral Given 08/11/17 0112)  sodium chloride 0.9 % bolus 1,000 mL (0 mLs Intravenous Stopped 08/10/17 1341)  Initial Impression / Assessment and Plan / ED Course  I have reviewed the triage vital signs and the nursing notes.  Pertinent labs & imaging results that were available during my care of the patient were reviewed by me and considered in my medical decision making (see chart for details).  Initially the patient's evaluation revealed a gentleman who appears chronically malnourished, he will need to have labs and some fluids as I suspect he has a slight hypotension.  I do not think that he needs neuro imaging, he does appear to have some worsening schizophrenic symptoms and will need mental health evaluation after labs have revealed that he is medically stable.  IV fluids given, metabolic panel improved, at change of shift the patient's care was signed out to Dr. Mancel Bale to follow-up on psychiatric recommendations for placement location  Final Clinical Impressions(s) / ED Diagnoses   Final diagnoses:  Schizophrenia, unspecified type [facility])  Agitation    ED Discharge Orders    None       Eber Hong, MD 08/11/17 [id number]

## 2017-08-10 NOTE — ED Provider Notes (Signed)
Screening labs reviewed, they are reassuring.  Patient is medically cleared for treatment by psychiatry.  Patient will likely need to be placed in a geriatric psychiatric unit   Mancel BaleWentz, Kazuo Durnil, MD 08/10/17 2321

## 2017-08-10 NOTE — BH Assessment (Addendum)
Tele Assessment Note   Patient Name: Ray Jackson Riddle MRN: [id number] Referring Physician: Eber HongBrian Savannah Morford, MD  Location of Patient:  Location of Provider: Behavioral Health TTS Department  Ray Jackson is an 61 y.o. male  Who presents voluntarily to [facility] ED via EMS [facility]( [place]) from [facility] [phone]). Rest home reports pt was upset punching walls stating that he wants to poke his eyes out.  Pt reports having increased thoughts of SI. Pt states" I just want to poke my eyes out and stab myself in the chest". Pt states that he feels really drowsy.  Pt states that he has been feeling very depressed for the past week. Pt states a decrease in sleep only 2 hours a night.  Pt states that he has been having issues with swallowing and is constantly throwing up his food. When writer asked how often does he feel SI he stated "always". Pt then told writer that he was not going to be able to answer the rest of the questions because he was getting sleepy. Pt states " If I had coffee that would wake me up" X2.   Writer [facility] collateral [facility](Rest Home Manager). [name] stated that Pt is usually a happy person, however approx a week ago the pt started acting very bizarre.[name] states that he has never been involved in previous mental health treatment. [name] stated that pt has been going into other residents rooms and that he stated that his dad was dead however [name] states his father is still alive. [name] stated that pt did not want to visit with his sister, which is unusual for him.  [name] stated that he was seen by a physician and it was found that pt had bacteria in urine, spot on his arm( L) and he was diagnosed with cellulitis.[name] stated that pt was prescribed an antibiotic but he is still acting very bizarre stating" the voices are telling him to something is going to get him and that he wants to poke his eyes out" . [name] states that pt has not wanted to get out of  bed lately and complete his daily ADL's.  [name] stated that he may have a possible UTI and that may be the reason for his behaviors. Pt current mood is depressed and his judgement and insight are poor.   Pt is dressed in hospital gown, oriented X4 with slow speech and restless motor behavior. Eye contact is fair and pt appeared to be very drowsy and sleepy. Pt mood is depressed and affect is flat. Thought process was is coherent. Pt could not complete assessment due to falling asleep. Writer obtained collateral from [employer](Rest home manager).   Diagnosis: Major Depression Disorder unspecified   Past Medical History:  Past Medical History:  Diagnosis Date  . Constipation   . Hypertension   . Hypokalemia   . Schizophrenia [facility](HCC)     Past Surgical History:  Procedure Laterality Date  . TONSILLECTOMY      Family History: History reviewed. No pertinent family history.  Social History:  reports that he has been smoking.  He has been smoking about 0.50 packs per day. he has never used smokeless tobacco. He reports that he does not drink alcohol or use drugs.  Additional Social History:     CIWA: CIWA-Ar BP: 93/72 Pulse Rate: 96 COWS:    PATIENT STRENGTHS: (choose at least two) Average or above average intelligence Supportive family/friends  Allergies: No Known Allergies  Home Medications:  (Not in  a hospital admission)  OB/GYN Status:  No LMP for male patient.  General Assessment Data Location of Assessment: [facility] TTS Assessment: In system Is this a Tele or Face-to-Face Assessment?: Tele Assessment Is this an Initial Assessment or a Re-assessment for this encounter?: Initial Assessment Marital status: Single Living Arrangements: Other (Comment)(Rest home [phone])) Can pt return to current living arrangement?: Yes Admission Status: Voluntary Is patient capable of signing voluntary admission?: Yes Referral Source: Other(Rest home )  Medical Screening Exam [facility]  Walk-in ONLY) Medical Exam completed: Yes  Crisis Care Plan Living Arrangements: Other (Comment)(Rest home [phone])) Legal Guardian: Other:(none) Name of Psychiatrist: no Name of Therapist: no  Education Status Highest grade of school patient has completed: 6th  Risk to self with the past 6 months Suicidal Ideation: Yes-Currently Present Has patient been a risk to self within the past 6 months prior to admission? : (Pt states he wants to poke his eyes out) Suicidal Intent: Yes-Currently Present(Per RH manager " this is the first time" he has been SI) Has patient had any suicidal intent within the past 6 months prior to admission? : No Is patient at risk for suicide?: Yes Suicidal Plan?: Yes-Currently Present(poke his eyes out) Has patient had any suicidal plan within the past 6 months prior to admission? : No Specify Current Suicidal Plan: (poke his eyes out ) Access to Means: No What has been your use of drugs/alcohol within the last 12 months?: no Previous Attempts/Gestures: No How many times?: 0 Other Self Harm Risks: 0 Triggers for Past Attempts: Other (Comment)(none) Intentional Self Injurious Behavior: (Pt states that he wants to poke his eyes out) Family Suicide History: Unable to assess Recent stressful life event(s): Other (Comment)(Bacteria found in urine possible UTI) Persecutory voices/beliefs?: Yes(Per RH manager pt states hears voices ) Depression: Yes Depression Symptoms: Feeling angry/irritable, Isolating, Despondent Substance abuse history and/or treatment for substance abuse?: No Suicide prevention information given to non-admitted patients: Not applicable  Risk to Others within the past 6 months Homicidal Ideation: No Does patient have any lifetime risk of violence toward others beyond the six months prior to admission? : No Thoughts of Harm to Others: No Current Homicidal Intent: No Current Homicidal Plan: No Access to Homicidal Means:  No Identified Victim: none History of harm to others?: No Assessment of Violence: None Noted Violent Behavior Description: none Does patient have access to weapons?: No Criminal Charges Pending?: No(RH manager states he is a sex offender) Does patient have a court date: No Is patient on probation?: No  Psychosis Hallucinations: Auditory(Pt states " something is going to get him") Delusions: None noted  Mental Status Report Appearance/Hygiene: Poor hygiene, In hospital gown, Disheveled Eye Contact: Poor Motor Activity: Unremarkable Speech: Slow Level of Consciousness: Irritable, Sleeping Mood: Irritable, Depressed Affect: Depressed Anxiety Level: Minimal Thought Processes: Tangential Judgement: Impaired Orientation: Person, Place, Situation, Time Obsessive Compulsive Thoughts/Behaviors: None  Cognitive Functioning Concentration: Poor Memory: Remote Impaired IQ: Average Insight: Poor Impulse Control: Poor Appetite: Fair Weight Loss: 0 Weight Gain: 0 Sleep: Decreased Total Hours of Sleep: 2 Vegetative Symptoms: Decreased grooming, Not bathing, Staying in bed  ADLScreening [facility] Assessment Services) Patient's cognitive ability adequate to safely complete daily activities?: Yes(RH manager reports decreased ADL's) Patient able to express need for assistance with ADLs?: Yes Independently performs ADLs?: No  Prior Inpatient Therapy Prior Inpatient Therapy: No Prior Therapy Dates: 0 Prior Therapy Facilty/Provider(s): 0 Reason for Treatment: none  Prior Outpatient Therapy Prior Outpatient Therapy: No Prior Therapy Dates: no Prior  Therapy Facilty/Provider(s): no Reason for Treatment: no Does patient have an ACCT team?: Yes([facility] ) Does patient have Intensive In-House Services?  : No Does patient have [facility] services? : No Does patient have P4CC services?: No  ADL Screening (condition at time of admission) Patient's cognitive ability adequate to  safely complete daily activities?: Medical [facility] manager reports decreased ADL's) Patient able to express need for assistance with ADLs?: Yes Independently performs ADLs?: No             Advance Directives (For Healthcare) Does Patient Have a Medical Advance Directive?: No Would patient like information on creating a medical advance directive?: No - Patient declined    Additional Information 1:1 In Past 12 Months?: No CIRT Risk: No Elopement Risk: No Does patient have medical clearance?: Yes     Disposition: Clinician discussed with Leighton Ruff FNP, she recommends gero-psych admission. Clinician informed Dr. Hyacinth Meeker of the recommendation and informed Leighton Ruff FNP of Dr. Rondel Baton  request for medications for the pt.  Disposition Initial Assessment Completed for this Encounter: Yes Disposition of Patient: Pending Review with psychiatrist  This service was provided via telemedicine using a 2-way, interactive audio and video technology.  Names of all persons participating in this telemedicine service and their role in this encounter. Name: Elease Hashimoto  Role: Rest Home Manager  Name:  Role:  Name:  Role:   Name: Role:    Cornell Barman [facility]  Therapeutic Triage Specialist  [phone]  Dwana Melena 08/10/2017 11:39 AM

## 2017-08-11 ENCOUNTER — Other Ambulatory Visit: Payer: Self-pay

## 2017-08-11 ENCOUNTER — Encounter (HOSPITAL_COMMUNITY): Payer: Self-pay | Admitting: Emergency Medicine

## 2017-08-11 LAB — URINE CULTURE: CULTURE: NO GROWTH

## 2017-08-11 MED ORDER — LOPERAMIDE HCL 2 MG PO CAPS
2.0000 mg | ORAL_CAPSULE | ORAL | Status: DC | PRN
  Administered 2017-08-11: 2 mg via ORAL
  Filled 2017-08-11: qty 1

## 2017-08-11 NOTE — ED Notes (Signed)
Ray HashimotoPatricia, supervisor at group home called to inquire regarding pt  Asks if he is to be admitted When he is placed in admission, please call [phone] or [phone] to inform where

## 2017-08-11 NOTE — ED Notes (Signed)
Snacks provided  Med provided Lights diminished

## 2017-08-11 NOTE — ED Notes (Signed)
Out of bed to BR 

## 2017-08-11 NOTE — ED Notes (Signed)
Pt request nightly meds

## 2017-08-11 NOTE — ED Notes (Signed)
Pt resting with sitter at bedside.  

## 2017-08-11 NOTE — ED Notes (Signed)
Pt given lunch tray.

## 2017-08-11 NOTE — BHH Counselor (Signed)
Patient presented to Ray Jackson on 08/10/17 with complaint of suicidal ideation, aggressive behavior, and a desire to self-injure by poking out eyes.  Pt was reassessed this AM.  Pt reported that he struggles to eat at the nursing home, is sleeping only two hours per night, and also that he is afraid that he will pull out his own eyes.  When asked about suicidal ideation, Pt reported that he has "suicidal tendencies.''  He stated also that he continues to have a desire to self-harm.  Recommend continued inpatient placement.

## 2017-08-11 NOTE — ED Notes (Signed)
Pt up to restroom to have bowel movement. Pt states he would like something for diarrhea. States when he eats he has diarrhea. MDinformed. Orders given.

## 2017-08-12 NOTE — ED Notes (Signed)
Pt becoming agitated and anxious.  Yelling out frequently about random things, especially about going back to group home.  Reassured inpatient tx was being sought.

## 2017-08-12 NOTE — ED Notes (Signed)
Pt calm and cooperative.  Denies wanting to harm self at this time.  States he hates where he lives and they are mean to him which makes him want to hurt himself.   Pt ate all of his breakfast, changed clothes, and made his bed.

## 2017-08-12 NOTE — Progress Notes (Addendum)
LCSW following for inpatient psychiatric admission. Patient has been referred to the following:  [facility]: under review [facility]: pending [facility]: pending [facility]: pending [facility]: pending [facility]: pending  LCSW will continue to assist with placement and follow up on referrals.  11:51 AM [facility] is pending acceptance at this time. LCSW has faxed over medical needs per MD request: Sent: updated vitals, med list, progress notes of patient eating, EKG, and chest xray.    Awaiting call back, but anticipating [facility] admission this afternoon. Will update RN once call returned from review.  Deretha EmoryHannah Desjuan Stearns LCSW, MSW Clinical Social Work: Optician, dispensingystem Wide Float Coverage for :  [phone]

## 2017-08-12 NOTE — ED Notes (Signed)
Pt's sister, [name] called inquiring about pt's status.  She states she is pt's legal guardian and POA.  Her number is [phone].  His father is Corwin Levinsom Barnaby and can be reached at [phone].  Please update with status.

## 2017-08-12 NOTE — ED Notes (Signed)
Pt became very upset when his dinner came.  Pt threw himself on the bed and was yelling "I am not talking to anyone, I am pissed off."  Investigated why pt was so upset and found he was unable to chew his dinner because it was a tough piece of chicken and he has no teeth.  Shredded pt's chicken for him and he returned to his chair to finish eating.

## 2017-08-12 NOTE — ED Notes (Signed)
Pt calmer now.  Keeps asking about lunch.

## 2017-08-13 MED ORDER — HYDROXYZINE HCL 25 MG PO TABS
50.0000 mg | ORAL_TABLET | Freq: Once | ORAL | Status: AC
  Administered 2017-08-13: 50 mg via ORAL
  Filled 2017-08-13: qty 2

## 2017-08-13 NOTE — BHH Counselor (Signed)
Clinician contacted [name] at [facility] and expressed she has faxed the requested documents and she spoke to the pt's nurse [name]( [name], RN) and the pt is not vomiting. Clinician noted from [name] she will review the documentation and call the clinician.    Ray Pullingreylese D Artemisia Auvil, MS, [facility], [facility] Triage Specialist [phone]

## 2017-08-13 NOTE — Progress Notes (Addendum)
Disposition CSW called and spoke with Charge Nurse, Victorino DikeJennifer, RN, to inquire about pt's behavior since yesterday afternoon.  She reports that pt was "doing fine."  CSW asked if pt's nurse can document behavior since yesterday.  Charge Nurse agreed to do so.  CSW will fax to [facility] when completed.  Timmothy EulerJean T. Kaylyn LimSutter, MSW, LCSWA Disposition Clinical Social Work [phone] (cell) [phone] (office)  CSW REFAXED LABS, VITAL, CHEST X-RAY AND RECENT EKG RESULTS TO [facility].  WILL AWAIT STATUS REVIEW.

## 2017-08-13 NOTE — ED Notes (Signed)
Pt reports no diarrhea since yesterday. [facility]

## 2017-08-13 NOTE — ED Notes (Signed)
Pt given meal tray.

## 2017-08-13 NOTE — Progress Notes (Signed)
CSW received a call from Grottoes[place]ace, Intake Coordinator @ [facility] requesting additional information.  CSW spoke to pt's RN, Zella BallRobin, and asked for updated vitals, report on patient's last episode of diarrhea.  Will send MAR and new vitals when they become available.  Timmothy EulerJean T. Kaylyn LimSutter, MSW, LCSWA Disposition Clinical Social Work [phone] (cell) [phone] (office)

## 2017-08-13 NOTE — ED Notes (Signed)
Pt ambulated to BR

## 2017-08-13 NOTE — ED Notes (Signed)
Pt.'s sister would like to be updated on pt.'s condition  [facility] Hospital[facility] Witcher  [phone]

## 2017-08-13 NOTE — ED Notes (Signed)
Pt is alert and cooperative. He does speak very loudly at times and will lower voice when asked

## 2017-08-13 NOTE — BHH Counselor (Signed)
Clinician received a call from [name] from [facility] expressing she never received the following documents, pt's EKG, chest xray and current vitals. [name] asked if the pt was currently vomiting. Clinician expressed she will provide her with the document however she will have to follow up with the pt's nurses if he is vomiting.    Redmond Pullingreylese D Shere Eisenhart, MS, [facility], [facility] Triage Specialist [phone]

## 2017-08-14 NOTE — BHH Counselor (Signed)
Pt is a 61 year old male who [facility] to [facility] from his rest home ([facility] -- [phone]) with expressed desire to harm self by pulling out eyes.  Pt also endorsed increased suicidal ideation.  Per report, Pt has become increasingly aggressive while staying at the hospital.  Pt was reassessed today.  He was polite, restless, and expansive.  He reported that he wanted to go to an inpatient treatment facility for persistent suicidal ideation, although he stated that he is not currently suicidal.  Pt acknowledged that he wanted to pull his eyes out recently.  Pt requested Thorazine or Zoloft.  Recommend continued inpatient placement.

## 2017-08-14 NOTE — ED Notes (Signed)
Asked patient if he would like to take a shower, patient states "yes I would." Patient in shower at this time.

## 2017-08-14 NOTE — ED Notes (Signed)
Patient ambulated to bathroom.

## 2017-08-14 NOTE — ED Notes (Signed)
Pt continues to talk while lying in bed. No other person is in room at this time. Pt is cooperative, still appears anxious.

## 2017-08-14 NOTE — ED Notes (Addendum)
Pt upset that he can't change the channels on the TV. While giving him instructions he became frustrated and stated "Fuck it then bitch. To hell with it." Remote removed from room. Pt still agitated and rocking back and forth.

## 2017-08-14 NOTE — ED Notes (Signed)
Pt back in room from shower. States he "Feels good and clean."
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
## 2017-08-14 NOTE — ED Notes (Signed)
Pt very restless,tossing and turning, pt talking to people that isn't in the room, back and forth to the restroom, pt had to be redirected to clean his bottom after having a BM, pt did not clean himself and when asked to do so or if help was needed pt seem to get angry and states "I did wipe my tail, why are you watching me"?, redirected pt at this time due to pt getting a little upset.

## 2017-08-14 NOTE — ED Notes (Signed)
Dennard NipEugene from [facility] assessing pt at this time.

## 2017-08-14 NOTE — ED Notes (Signed)
Pt ate 100% of meal tray, now laying down quietly in stretcher.

## 2017-08-14 NOTE — ED Notes (Signed)
Pt given meal tray.

## 2017-08-14 NOTE — ED Notes (Signed)
Patient given meal tray.

## 2017-08-14 NOTE — ED Notes (Signed)
Pt continues to talk to himself , pt is stating "Altamese CabalMerry Christmas, Happy New Year I hope you die today, I died at birth so why cant you do the same" , pt very restless at this time, up and out of bed every five minutes, pacing the floor, rocking back and forth, etc

## 2017-08-14 NOTE — Progress Notes (Signed)
[facility]  [facility] for admission on 08/15/17. Dr. Lowanda FosterBeverly Jackson  is the accepting/attending provider.  Call report to [phone]   Pt is Voluntary.  [facility]   CSW contacted pt's Legal Guardian, his sister, Ray LincolnBeth Jackson, to request copy of Guardianship papers.  Ms. [name] faxed copy to TTS office.  CSW faxed paperwork to [facility] and called to confirm receipt.  Clinical Admissions coordinator from [facility] sent the Request for Voluntary Admission and CSW faxed to pt's sister/legal guardian for signature.  CSW requested that this form be faxed both to [facility] and [facility].  When form is returned, CSW will follow to ensure that pt transfer is coordinated.  Timmothy EulerJean T. Kaylyn LimSutter, MSW, LCSWA Disposition Clinical Social Work [phone] (cell) [phone] (office)

## 2017-08-14 NOTE — ED Notes (Signed)
Pt given cup of water and cup of OJ per request.

## 2017-08-15 NOTE — ED Notes (Signed)
[facility]HH contacted this am to find out what needs to be done to get patient transferred. Case worker was to contact poa and get the consent to admit signed for [facility].

## 2017-08-15 NOTE — ED Notes (Signed)
Telephone consent to transfer pt to [facility] obtained from guardian, Marine [employer]eth Witcher.  Consent given to 2 RNs

## 2017-08-15 NOTE — ED Notes (Addendum)
Pt given breakfast tray

## 2017-08-15 NOTE — ED Notes (Signed)
Called [facility] transportation [facility]

## 2017-08-15 NOTE — Progress Notes (Signed)
Accepted [facility]   Accepting/attending physician is Dr. Lowanda FosterBeverly Jones Patient is going to room 419.  Nurse to Nurse report number is [phone] The bed is ready, patient can transport at anytime.    Ray Juneshristopher Winningham, RN notified.     Ray Jackson, MSW, LCSWA Clinical Social Worker (Disposition) [facility]  [phone]

## 2017-08-16 DIAGNOSIS — E440 Moderate protein-calorie malnutrition: Secondary | ICD-10-CM | POA: Insufficient documentation

## 2017-08-16 DIAGNOSIS — R1310 Dysphagia, unspecified: Secondary | ICD-10-CM | POA: Insufficient documentation

## 2017-08-24 DIAGNOSIS — K5909 Other constipation: Secondary | ICD-10-CM | POA: Insufficient documentation

## 2017-08-26 DIAGNOSIS — R339 Retention of urine, unspecified: Secondary | ICD-10-CM | POA: Insufficient documentation

## 2017-08-26 DIAGNOSIS — N138 Other obstructive and reflux uropathy: Secondary | ICD-10-CM | POA: Insufficient documentation

## 2017-08-26 DIAGNOSIS — K629 Disease of anus and rectum, unspecified: Secondary | ICD-10-CM | POA: Insufficient documentation

## 2017-09-01 DIAGNOSIS — D518 Other vitamin B12 deficiency anemias: Secondary | ICD-10-CM | POA: Insufficient documentation

## 2017-09-13 ENCOUNTER — Emergency Department (HOSPITAL_COMMUNITY)
Admission: EM | Admit: 2017-09-13 | Discharge: 2017-09-13 | Disposition: A | Discharge status: Home / Self Care | Payer: Medicaid Other | Attending: Emergency Medicine | Admitting: Emergency Medicine

## 2017-09-13 ENCOUNTER — Other Ambulatory Visit: Payer: Self-pay

## 2017-09-13 ENCOUNTER — Encounter (HOSPITAL_COMMUNITY): Payer: Self-pay | Admitting: Emergency Medicine

## 2017-09-13 DIAGNOSIS — Y658 Other specified misadventures during surgical and medical care: Secondary | ICD-10-CM | POA: Diagnosis not present

## 2017-09-13 DIAGNOSIS — I10 Essential (primary) hypertension: Secondary | ICD-10-CM | POA: Diagnosis not present

## 2017-09-13 DIAGNOSIS — F17210 Nicotine dependence, cigarettes, uncomplicated: Secondary | ICD-10-CM | POA: Diagnosis not present

## 2017-09-13 DIAGNOSIS — T83098A Other mechanical complication of other indwelling urethral catheter, initial encounter: Secondary | ICD-10-CM | POA: Diagnosis not present

## 2017-09-13 DIAGNOSIS — Z79899 Other long term (current) drug therapy: Secondary | ICD-10-CM | POA: Diagnosis not present

## 2017-09-13 DIAGNOSIS — R339 Retention of urine, unspecified: Secondary | ICD-10-CM | POA: Diagnosis present

## 2017-09-13 MED ORDER — LORAZEPAM 2 MG/ML IJ SOLN
0.50 | INTRAMUSCULAR | Status: DC
Start: ? — End: 2017-09-13

## 2017-09-13 MED ORDER — CLONAZEPAM 1 MG PO TABS
1.00 | ORAL_TABLET | ORAL | Status: DC
Start: 2017-09-12 — End: 2017-09-13

## 2017-09-13 MED ORDER — VITAMIN D3 25 MCG (1000 UNIT) PO TABS
ORAL_TABLET | ORAL | Status: DC
Start: 2017-09-13 — End: 2017-09-13

## 2017-09-13 MED ORDER — MIRTAZAPINE 15 MG PO TABS
30.00 | ORAL_TABLET | ORAL | Status: DC
Start: 2017-09-12 — End: 2017-09-13

## 2017-09-13 MED ORDER — RISPERIDONE 1 MG PO TBDP
1.00 | ORAL_TABLET | ORAL | Status: DC
Start: 2017-09-12 — End: 2017-09-13

## 2017-09-13 MED ORDER — TRAZODONE HCL 100 MG PO TABS
200.00 | ORAL_TABLET | ORAL | Status: DC
Start: 2017-09-12 — End: 2017-09-13

## 2017-09-13 MED ORDER — BENZOCAINE-MENTHOL 15-3.6 MG MT LOZG
LOZENGE | OROMUCOSAL | Status: DC
Start: ? — End: 2017-09-13

## 2017-09-13 MED ORDER — ACETAMINOPHEN 325 MG PO TABS
650.00 | ORAL_TABLET | ORAL | Status: DC
Start: ? — End: 2017-09-13

## 2017-09-13 MED ORDER — OLANZAPINE 10 MG IM SOLR
2.50 | INTRAMUSCULAR | Status: DC
Start: ? — End: 2017-09-13

## 2017-09-13 MED ORDER — LITHIUM CARBONATE ER 450 MG PO TBCR
450.00 | EXTENDED_RELEASE_TABLET | ORAL | Status: DC
Start: 2017-09-12 — End: 2017-09-13

## 2017-09-13 MED ORDER — FLUTICASONE PROPIONATE 50 MCG/ACT NA SUSP
NASAL | Status: DC
Start: 2017-09-13 — End: 2017-09-13

## 2017-09-13 MED ORDER — LORATADINE 10 MG PO TABS
10.00 | ORAL_TABLET | ORAL | Status: DC
Start: 2017-09-13 — End: 2017-09-13

## 2017-09-13 MED ORDER — TRAZODONE HCL 50 MG PO TABS
50.00 | ORAL_TABLET | ORAL | Status: DC
Start: ? — End: 2017-09-13

## 2017-09-13 MED ORDER — ANTACID & ANTIGAS 200-200-20 MG/5ML PO SUSP
30.00 | ORAL | Status: DC
Start: ? — End: 2017-09-13

## 2017-09-13 MED ORDER — LACTULOSE 10 GM/15ML PO SOLN
20.00 | ORAL | Status: DC
Start: 2017-09-12 — End: 2017-09-13

## 2017-09-13 MED ORDER — LORAZEPAM 0.5 MG PO TABS
.50 | ORAL_TABLET | ORAL | Status: DC
Start: ? — End: 2017-09-13

## 2017-09-13 MED ORDER — TAMSULOSIN HCL 0.4 MG PO CAPS
0.40 | ORAL_CAPSULE | ORAL | Status: DC
Start: 2017-09-13 — End: 2017-09-13

## 2017-09-13 MED ORDER — OLANZAPINE 5 MG PO TBDP
2.50 | ORAL_TABLET | ORAL | Status: DC
Start: ? — End: 2017-09-13

## 2017-09-13 MED ORDER — FINASTERIDE 5 MG PO TABS
5.00 | ORAL_TABLET | ORAL | Status: DC
Start: 2017-09-13 — End: 2017-09-13

## 2017-09-13 MED ORDER — VITAMIN B-12 1000 MCG PO TABS
ORAL_TABLET | ORAL | Status: DC
Start: ? — End: 2017-09-13

## 2017-09-13 MED ORDER — ONDANSETRON HCL 4 MG PO TABS
4.00 | ORAL_TABLET | ORAL | Status: DC
Start: ? — End: 2017-09-13

## 2017-09-13 MED ORDER — MAGNESIUM HYDROXIDE 400 MG/5ML PO SUSP
15.00 | ORAL | Status: DC
Start: ? — End: 2017-09-13

## 2017-09-13 NOTE — Discharge Instructions (Addendum)
You were seen in the Emergency Department so that your urinary catheter could be replaced. Please do not pull this catheter out without medical assistance. Please follow up with urology as planned.

## 2017-09-13 NOTE — ED Notes (Signed)
Bladder scan showed greater than 200cc with lower ABD distention.

## 2017-09-13 NOTE — ED Notes (Signed)
Pt's catheter placed on a leg bag. Pt given education regarding emptying leg bag and cleaning.  Pt state he understands

## 2017-09-13 NOTE — ED Triage Notes (Signed)
Patient brought in via EMS from [facility]. [facility] yesterday after being admitted for a month floor SI. Patient denies any HI or SI. Patient had [facility]ath [facility] and was discharged with foley. Patient states foley bag was full and he couldn't "figure out how to empty it." Patient states started to have pressure and small amount of blood in bag. Patient pulled foley out. Patient does have blood on his pants. Patient able to void. Patient also states he has had some issues with constipation, last BM this morning.

## 2017-09-13 NOTE — ED Provider Notes (Signed)
[facility]ERGENCY DEPARTMENT Provider Note   CSN: [id number] Arrival date & time: 09/13/17  [id number]     History   Chief Complaint Chief Complaint  Patient presents with  . Urinary Retention    HPI Ray Jackson is a 62 y.o. male with history of HTN, schizophrenia on multiple psych meds including benztropine, presenting for urinary retention. Patient was discharged from the hospital yesterday to [facility] facility with foley in place for urinary retention. This morning, the patient stated he did not see anybody around to help him empty the foley bag, so he chose to pull the foley out. He noticed some blood when he pulled the foley. He has been able to urinate since it was pulled, with no dysuria, no abdominal pain. Post-void residual with >200cc urine. Patient denies fevers, N/V/D/C.   Past Medical History:  Diagnosis Date  . Constipation   . Hypertension   . Hypokalemia   . Schizophrenia (HCC)     There are no active problems to display for this patient.   Past Surgical History:  Procedure Laterality Date  . TONSILLECTOMY       Home Medications    Prior to Admission medications   Medication Sig Start Date End Date Taking? Authorizing Provider  docusate sodium (COLACE) 100 MG capsule Take 100 mg by mouth 2 (two) times daily.   Yes [provider]  traZODone (DESYREL) 100 MG tablet Take 200 mg by mouth at bedtime.   Yes [provider]  amLODipine (NORVASC) 10 MG tablet Take 10 mg by mouth daily.    [provider]  benztropine (COGENTIN) 1 MG tablet Take 1 mg by mouth 2 (two) times daily.    [provider]  cetirizine (ZYRTEC) 10 MG tablet Take 10 mg by mouth daily.    [provider]  Cholecalciferol (VITAMIN D3) 2000 units capsule Take 2,000 Units by mouth daily.    [provider]  fluticasone (FLONASE) 50 MCG/ACT nasal spray Place 2 sprays into both nostrils daily.    [provider]  haloperidol  (HALDOL) 10 MG tablet Take 10 mg by mouth at bedtime.    [provider]  haloperidol (HALDOL) 5 MG tablet Take 5 mg by mouth every 4 (four) hours as needed for agitation.    [provider]  hydrochlorothiazide (HYDRODIURIL) 25 MG tablet Take 25 mg by mouth daily.    [provider]  mirtazapine (REMERON) 30 MG tablet Take 30 mg by mouth at bedtime.    [provider]  paliperidone (INVEGA SUSTENNA) 156 MG/ML SUSP injection Inject 156 mg into the muscle every 30 (thirty) days.    [provider]  polyethylene glycol (MIRALAX / GLYCOLAX) packet Take 17 g by mouth daily.    [provider]  potassium chloride SA (K-DUR,KLOR-CON) 20 MEQ tablet Take 2 tablets (40 mEq total) by mouth daily. 08/05/17   Lavera GuiseLiu, Dana Duo, MD    Family History History reviewed. No pertinent family history.  Social History Social History   Tobacco Use  . Smoking status: Current Every Day Smoker    Packs/day: 0.50  . Smokeless tobacco: Never Used  Substance Use Topics  . Alcohol use: No    Frequency: Never  . Drug use: No     Allergies   Patient has no known allergies.   Review of Systems Review of Systems See HPI for ROS   Physical Exam Updated Vital Signs BP 139/90 (BP Location: Left Arm)   Pulse  66   Temp 98.5 F (36.9 C) (Oral)   Resp 17   Ht 6' (1.829 m)   Wt 52.2 kg (115 lb)   SpO2 99%   BMI 15.60 kg/m   Physical Exam  Constitutional: He appears well-developed and well-nourished.  HENT:  Head: Normocephalic and atraumatic.  Neck: Neck supple.  Cardiovascular: Normal rate and regular rhythm.  Pulmonary/Chest: Effort normal and breath sounds normal.  Abdominal: Soft. Bowel sounds are normal.  Musculoskeletal: He exhibits no deformity.  Neurological: He is alert.  Skin: Skin is warm and dry. Capillary refill takes less than 2 seconds.     ED Treatments / Results  Labs (all labs ordered are listed, but only abnormal results are  displayed) Labs Reviewed - No data to display  EKG  EKG Interpretation None       Radiology No results found.  Procedures Procedures (including critical care time)  Medications Ordered in ED Medications - No data to display   Initial Impression / Assessment and Plan / ED Course  I have reviewed the triage vital signs and the nursing notes.  Pertinent labs & imaging results that were available during my care of the patient were reviewed by me and considered in my medical decision making (see chart for details).    62 yo with history of paranoid schizophrenia, recently discharged from [facility] with a foley for urinary retention who presents the ED after pulling the foley out this AM. Patient continues to have retention on post-void residual, and foley was replaced and irrigated. Foley teaching was provided. No red flags or evidence of infection. Plan for him to follow up with urology as originally planned.  I spoke with the patient's legal guardian, his sister Ray Jackson and [phone] to update her on the discharge plan with the foley.  Final Clinical Impressions(s) / ED Diagnoses   Final diagnoses:  Urinary retention    ED Discharge Orders    None       Howard Pouch, MD 09/13/17 1202    Howard Pouch, MD 09/13/17 1239    Blane Ohara, MD 09/13/17 [phone]

## 2017-09-14 ENCOUNTER — Emergency Department (HOSPITAL_COMMUNITY)
Admission: EM | Admit: 2017-09-14 | Discharge: 2017-09-14 | Disposition: A | Discharge status: Home / Self Care | Payer: Medicaid Other | Attending: Emergency Medicine | Admitting: Emergency Medicine

## 2017-09-14 DIAGNOSIS — R319 Hematuria, unspecified: Secondary | ICD-10-CM | POA: Insufficient documentation

## 2017-09-14 DIAGNOSIS — F17200 Nicotine dependence, unspecified, uncomplicated: Secondary | ICD-10-CM | POA: Diagnosis not present

## 2017-09-14 DIAGNOSIS — I10 Essential (primary) hypertension: Secondary | ICD-10-CM | POA: Insufficient documentation

## 2017-09-14 DIAGNOSIS — Z79899 Other long term (current) drug therapy: Secondary | ICD-10-CM | POA: Insufficient documentation

## 2017-09-14 MED ORDER — ACETAMINOPHEN 325 MG PO TABS
650.0000 mg | ORAL_TABLET | Freq: Once | ORAL | Status: AC
  Administered 2017-09-14: 650 mg via ORAL
  Filled 2017-09-14: qty 2

## 2017-09-14 NOTE — ED Notes (Signed)
Pt dropped off by staff from [facility].  His name and number placed in triage to call when ready to pick pt up.

## 2017-09-14 NOTE — ED Notes (Addendum)
Leg bag drained. Foley catheter irrigated with 500 cc of sterile NS. Leg bag replaced. Line flushed easily. 550 cc returned.

## 2017-09-14 NOTE — ED Triage Notes (Signed)
Pt sent from [facility] for evaluation of blood in urine.  D/c from [facility] with foley and pulled it out.

## 2017-09-14 NOTE — Discharge Instructions (Signed)
The bleeding which you are having is likely related to trauma of having the catheter pulled out yesterday.  This bleeding should gradually improve, while the catheter was left in place.  It is important to follow-up with the urologist, next week, on Tuesday as scheduled.  Return here, if needed, for problems.

## 2017-09-14 NOTE — ED Provider Notes (Signed)
[facility] EMERGENCY DEPARTMENT Provider Note   CSN: [id number] Arrival date & time: 09/14/17  1351     History   Chief Complaint No chief complaint on file.   HPI Ray Jackson is a 62 y.o. male.  Patient presents for evaluation of blood in Foley catheter bag.  He was seen and evaluated yesterday, at this facility for urinary retention, after pulling out an indwelling Foley catheter.  He had his catheter replaced and irrigated, and was discharged in stable condition.  He denies other current problems including pain.  He is currently living in a group home.  He was discharged from a hospital psychiatric unit, 3 days ago after treatment for schizophrenia, during which time he had both intermittent catheterizations, and indwelling Foley catheterization.  He was seen by urology who felt that he had prostatic hypertrophy.  Plans were made for him to follow-up with urology as an outpatient however it is not clear if there was an appointment scheduled.  There are no other known modifying factors.  HPI  Past Medical History:  Diagnosis Date  . Constipation   . Hypertension   . Hypokalemia   . Schizophrenia (HCC)     There are no active problems to display for this patient.   Past Surgical History:  Procedure Laterality Date  . TONSILLECTOMY         Home Medications    Prior to Admission medications   Medication Sig Start Date End Date Taking? Authorizing Provider  cetirizine (ZYRTEC) 10 MG tablet Take 10 mg by mouth at bedtime.    Yes [provider]  Cholecalciferol (VITAMIN D3) 2000 units capsule Take 2,000 Units by mouth daily.   Yes [provider]  clonazePAM (KLONOPIN) 1 MG tablet Take 1 tablet by mouth at bedtime. 09/12/17 10/12/17 Yes [provider]  docusate sodium (COLACE) 100 MG capsule Take 100 mg by mouth 2 (two) times daily.   Yes [provider]  finasteride (PROSCAR) 5 MG tablet Take 5 mg by mouth daily.   Yes [provider]  fluticasone (FLONASE) 50 MCG/ACT nasal spray Place 2 sprays into both nostrils daily.   Yes [provider]  hydrochlorothiazide (HYDRODIURIL) 25 MG tablet Take 25 mg by mouth daily.   Yes [provider]  lactulose (CHRONULAC) 10 GM/15ML solution Take 30 g by mouth 2 (two) times daily.   Yes [provider]  lithium carbonate (ESKALITH) 450 MG CR tablet Take 1 tablet by mouth every 12 (twelve) hours. 09/12/17 10/12/17 Yes [provider]  mirtazapine (REMERON) 30 MG tablet Take 30 mg by mouth at bedtime.   Yes [provider]  paliperidone (INVEGA SUSTENNA) 156 MG/ML SUSP injection Inject 156 mg into the muscle every 30 (thirty) days.   Yes [provider]  polyethylene glycol (MIRALAX / GLYCOLAX) packet Take 17 g by mouth daily.   Yes [provider]  risperiDONE (RISPERDAL M-TABS) 1 MG disintegrating tablet Take 1 tablet by mouth at bedtime. 09/12/17 10/12/17 Yes [provider]  tamsulosin (FLOMAX) 0.4 MG CAPS capsule Take 1 capsule by mouth daily. 09/13/17  Yes [provider]  traZODone (DESYREL) 100 MG tablet Take 200 mg by mouth at bedtime.   Yes [provider]  vitamin B-12 (CYANOCOBALAMIN) 1000 MCG tablet Take 1,000 mcg by mouth daily.   Yes [provider]  potassium chloride SA (K-DUR,KLOR-CON) 20 MEQ tablet Take 2 tablets (40 mEq total) by mouth daily. Patient not taking: Reported on 09/14/2017 08/05/17  [name], MD    Family History No family history on file.  Social History Social History   Tobacco Use  . Smoking status: Current Every Day Smoker    Packs/day: 0.50  . Smokeless tobacco: Never Used  Substance Use Topics  . Alcohol use: No    Frequency: Never  . Drug use: No     Allergies   Patient has no known allergies.   Review of Systems Review of Systems  All other systems reviewed and are negative.    Physical Exam Updated Vital Signs BP  (!) 146/77 (BP Location: Right Arm)   Pulse (!) 103   Temp (!) 100.5 F (38.1 C) (Oral)   Resp 20   Ht 6' (1.829 m)   Wt 59.4 kg (131 lb)   SpO2 97%   BMI 17.77 kg/m   Physical Exam  Constitutional: He is oriented to person, place, and time. He appears well-developed. No distress.  Appears older than stated age  HENT:  Head: Normocephalic and atraumatic.  Right Ear: External ear normal.  Left Ear: External ear normal.  Eyes: Conjunctivae and EOM are normal. Pupils are equal, round, and reactive to light.  Neck: Normal range of motion and phonation normal. Neck supple.  Cardiovascular: Normal rate.  Pulmonary/Chest: Effort normal. He exhibits no bony tenderness.  Abdominal: Soft. There is no tenderness.  Genitourinary:  Genitourinary Comments: Foley catheter in urethral meatus penis scrotum and scrotal contents are normal.  There is a small amount of blood on the external Foley catheter, and there is bloody urine in the Foley catheter bag.  Musculoskeletal: Normal range of motion.  Neurological: He is alert and oriented to person, place, and time. No cranial nerve deficit or sensory deficit. He exhibits normal muscle tone. Coordination normal.  Skin: Skin is warm, dry and intact.  Psychiatric: He has a normal mood and affect. His behavior is normal. Judgment and thought content normal.  Nursing note and vitals reviewed.    ED Treatments / Results  Labs (all labs ordered are listed, but only abnormal results are displayed) Labs Reviewed - No data to display  EKG  EKG Interpretation None       Radiology No results found.  Procedures Procedures (including critical care time)  Medications Ordered in ED Medications - No data to display   Initial Impression / Assessment and Plan / ED Course  I have reviewed the triage vital signs and the nursing notes.  Pertinent labs & imaging results that were available during my care of the patient were reviewed by me and  considered in my medical decision making (see chart for details).  Clinical Course as of Sep 14 1526  Sat Sep 14, 2017  1413 Initial evaluation is consistent with traumatic injury to urethra secondary to advertently dislodged catheter with balloon up.    [EW]    Clinical Course User Index [EW] Mancel Bale, MD     Patient Vitals for the past 24 hrs:  BP Temp Temp src Pulse Resp SpO2 Height Weight  09/14/17 1356 (!) 146/77 (!) 100.5 F (38.1 C) Oral (!) 103 20 97 % 6' (1.829 m) 59.4 kg (131 lb)    3:27 PM Reevaluation with update and discussion. After initial assessment and treatment, an updated evaluation reveals patient is comfortable and Foley catheter drainage has cleared following irrigation by nursing.  The patient thinks he has a follow-up appointment with urology in 3 days.  Attempted to contact the patient's POA, no answer  at the phone number listed on the demographics. Mancel BaleElliott Chyan Carnero      Final Clinical Impressions(s) / ED Diagnoses   Final diagnoses:  Hematuria, unspecified type   Hematuria with indwelling Foley catheter.  Suspect traumatic related bleeding, which should gradually improve.  Patient continues to need follow-up with urology for urinary retention, likely related to prostatic hypertrophy by report.  Doubt sepsis, or metabolic instability.  Nursing Notes Reviewed/ Care Coordinated Applicable Imaging Reviewed Interpretation of Laboratory Data incorporated into ED treatment  The patient appears reasonably screened and/or stabilized for discharge and I doubt any other medical condition or other [facility] requiring further screening, evaluation, or treatment in the ED at this time prior to discharge.  Plan: Home Medications-continue current medications; Home Treatments-drink plenty of water; return here if the recommended treatment, does not improve the symptoms; Recommended follow up-urology follow-up in 3 days as scheduled.   ED Discharge Orders    None         Mancel BaleWentz, Parker Wherley, MD 09/14/17 1529

## 2017-10-02 ENCOUNTER — Other Ambulatory Visit: Payer: Self-pay

## 2017-10-02 ENCOUNTER — Encounter (HOSPITAL_COMMUNITY): Payer: Self-pay

## 2017-10-02 ENCOUNTER — Inpatient Hospital Stay (HOSPITAL_COMMUNITY)
Admission: EM | Admit: 2017-10-02 | Discharge: 2017-10-05 | DRG: 690 | Disposition: A | Discharge status: Other Acute Inpatient Hospital | Payer: Medicaid Other | Attending: Family Medicine | Admitting: Family Medicine

## 2017-10-02 DIAGNOSIS — K5900 Constipation, unspecified: Secondary | ICD-10-CM | POA: Diagnosis present

## 2017-10-02 DIAGNOSIS — E871 Hypo-osmolality and hyponatremia: Secondary | ICD-10-CM | POA: Diagnosis present

## 2017-10-02 DIAGNOSIS — F209 Schizophrenia, unspecified: Secondary | ICD-10-CM | POA: Diagnosis present

## 2017-10-02 DIAGNOSIS — N3000 Acute cystitis without hematuria: Secondary | ICD-10-CM

## 2017-10-02 DIAGNOSIS — N3090 Cystitis, unspecified without hematuria: Secondary | ICD-10-CM | POA: Diagnosis present

## 2017-10-02 DIAGNOSIS — F17210 Nicotine dependence, cigarettes, uncomplicated: Secondary | ICD-10-CM | POA: Diagnosis present

## 2017-10-02 DIAGNOSIS — F411 Generalized anxiety disorder: Secondary | ICD-10-CM | POA: Diagnosis present

## 2017-10-02 DIAGNOSIS — N179 Acute kidney failure, unspecified: Secondary | ICD-10-CM | POA: Diagnosis present

## 2017-10-02 DIAGNOSIS — D649 Anemia, unspecified: Secondary | ICD-10-CM | POA: Diagnosis present

## 2017-10-02 DIAGNOSIS — E876 Hypokalemia: Secondary | ICD-10-CM | POA: Diagnosis present

## 2017-10-02 DIAGNOSIS — Z79899 Other long term (current) drug therapy: Secondary | ICD-10-CM | POA: Diagnosis not present

## 2017-10-02 DIAGNOSIS — E860 Dehydration: Secondary | ICD-10-CM | POA: Diagnosis present

## 2017-10-02 DIAGNOSIS — N12 Tubulo-interstitial nephritis, not specified as acute or chronic: Secondary | ICD-10-CM | POA: Diagnosis present

## 2017-10-02 DIAGNOSIS — I10 Essential (primary) hypertension: Secondary | ICD-10-CM | POA: Diagnosis present

## 2017-10-02 DIAGNOSIS — N390 Urinary tract infection, site not specified: Secondary | ICD-10-CM | POA: Diagnosis present

## 2017-10-02 HISTORY — DX: Acute kidney failure, unspecified: N17.9

## 2017-10-02 HISTORY — DX: Hypo-osmolality and hyponatremia: E87.1

## 2017-10-02 LAB — COMPREHENSIVE METABOLIC PANEL
ALK PHOS: 53 U/L (ref 38–126)
ALT: 10 U/L — AB (ref 17–63)
AST: 13 U/L — AB (ref 15–41)
Albumin: 2.4 g/dL — ABNORMAL LOW (ref 3.5–5.0)
Anion gap: 10 (ref 5–15)
BUN: 21 mg/dL — ABNORMAL HIGH (ref 6–20)
CALCIUM: 8.4 mg/dL — AB (ref 8.9–10.3)
CHLORIDE: 90 mmol/L — AB (ref 101–111)
CO2: 26 mmol/L (ref 22–32)
CREATININE: 1.58 mg/dL — AB (ref 0.61–1.24)
GFR calc non Af Amer: 45 mL/min — ABNORMAL LOW (ref >60)
GFR, EST AFRICAN AMERICAN: 52 mL/min — AB (ref >60)
Glucose, Bld: 158 mg/dL — ABNORMAL HIGH (ref 65–99)
Potassium: 3.5 mmol/L (ref 3.5–5.1)
SODIUM: 126 mmol/L — AB (ref 135–145)
Total Bilirubin: 0.5 mg/dL (ref 0.3–1.2)
Total Protein: 6.2 g/dL — ABNORMAL LOW (ref 6.5–8.1)

## 2017-10-02 LAB — URINALYSIS, COMPLETE (UACMP) WITH MICROSCOPIC
Bilirubin Urine: NEGATIVE
GLUCOSE, UA: NEGATIVE mg/dL
Ketones, ur: NEGATIVE mg/dL
NITRITE: NEGATIVE
PH: 6 (ref 5.0–8.0)
Protein, ur: 30 mg/dL — AB
Specific Gravity, Urine: 1.006 (ref 1.005–1.030)
Squamous Epithelial / LPF: NONE SEEN

## 2017-10-02 LAB — CBC WITH DIFFERENTIAL/PLATELET
BASOS PCT: 0 %
Basophils Absolute: 0 10*3/uL (ref 0.0–0.1)
EOS ABS: 0 10*3/uL (ref 0.0–0.7)
Eosinophils Relative: 0 %
HEMATOCRIT: 34.3 % — AB (ref 39.0–52.0)
HEMOGLOBIN: 10.8 g/dL — AB (ref 13.0–17.0)
Lymphocytes Relative: 8 %
Lymphs Abs: 1.2 10*3/uL (ref 0.7–4.0)
MCH: 32 pg (ref 26.0–34.0)
MCHC: 31.5 g/dL (ref 30.0–36.0)
MCV: 101.5 fL — ABNORMAL HIGH (ref 78.0–100.0)
MONOS PCT: 12 %
Monocytes Absolute: 1.9 10*3/uL — ABNORMAL HIGH (ref 0.1–1.0)
NEUTROS ABS: 13.3 10*3/uL — AB (ref 1.7–7.7)
NEUTROS PCT: 80 %
Platelets: 427 10*3/uL — ABNORMAL HIGH (ref 150–400)
RBC: 3.38 MIL/uL — ABNORMAL LOW (ref 4.22–5.81)
RDW: 14.8 % (ref 11.5–15.5)
WBC: 16.5 10*3/uL — ABNORMAL HIGH (ref 4.0–10.5)

## 2017-10-02 LAB — RAPID URINE DRUG SCREEN, HOSP PERFORMED
AMPHETAMINES: NOT DETECTED
BARBITURATES: NOT DETECTED
Benzodiazepines: NOT DETECTED
Cocaine: NOT DETECTED
OPIATES: NOT DETECTED
TETRAHYDROCANNABINOL: NOT DETECTED

## 2017-10-02 LAB — SODIUM, URINE, RANDOM: Sodium, Ur: 34 mmol/L

## 2017-10-02 LAB — ETHANOL: Alcohol, Ethyl (B): <10 mg/dL (ref <10)

## 2017-10-02 LAB — LITHIUM LEVEL: Lithium Lvl: 0.66 mmol/L (ref 0.60–1.20)

## 2017-10-02 MED ORDER — TAMSULOSIN HCL 0.4 MG PO CAPS
0.4000 mg | ORAL_CAPSULE | Freq: Every day | ORAL | Status: DC
  Administered 2017-10-02 – 2017-10-05 (×4): 0.4 mg via ORAL
  Filled 2017-10-02 (×4): qty 1

## 2017-10-02 MED ORDER — ACETAMINOPHEN 650 MG RE SUPP: 650.0000 mg | Freq: Four times a day (QID) | RECTAL | Status: DC | PRN

## 2017-10-02 MED ORDER — ENOXAPARIN SODIUM 30 MG/0.3ML ~~LOC~~ SOLN
30.0000 mg | SUBCUTANEOUS | Status: DC
  Administered 2017-10-02: 30 mg via SUBCUTANEOUS
  Filled 2017-10-02: qty 0.3

## 2017-10-02 MED ORDER — SODIUM CHLORIDE 0.9 % IV BOLUS (SEPSIS)
1000.0000 mL | Freq: Once | INTRAVENOUS | Status: AC
  Administered 2017-10-02: 1000 mL via INTRAVENOUS

## 2017-10-02 MED ORDER — SODIUM CHLORIDE 0.9 % IV SOLN
1.0000 g | INTRAVENOUS | Status: DC
  Administered 2017-10-03 – 2017-10-04 (×2): 1 g via INTRAVENOUS
  Filled 2017-10-02 (×2): qty 1
  Filled 2017-10-02 (×2): qty 10

## 2017-10-02 MED ORDER — DOCUSATE SODIUM 100 MG PO CAPS
100.0000 mg | ORAL_CAPSULE | Freq: Two times a day (BID) | ORAL | Status: DC
  Administered 2017-10-02 – 2017-10-05 (×6): 100 mg via ORAL
  Filled 2017-10-02 (×6): qty 1

## 2017-10-02 MED ORDER — SODIUM CHLORIDE 0.9 % IV SOLN
1.0000 g | Freq: Once | INTRAVENOUS | Status: AC
  Administered 2017-10-02: 1 g via INTRAVENOUS
  Filled 2017-10-02: qty 10

## 2017-10-02 MED ORDER — VITAMIN B-12 1000 MCG PO TABS
1000.0000 ug | ORAL_TABLET | Freq: Every day | ORAL | Status: DC
  Administered 2017-10-03 – 2017-10-05 (×3): 1000 ug via ORAL
  Filled 2017-10-02 (×3): qty 1

## 2017-10-02 MED ORDER — FINASTERIDE 5 MG PO TABS
5.0000 mg | ORAL_TABLET | Freq: Every day | ORAL | Status: DC
  Administered 2017-10-02 – 2017-10-05 (×4): 5 mg via ORAL
  Filled 2017-10-02 (×7): qty 1

## 2017-10-02 MED ORDER — ACETAMINOPHEN 325 MG PO TABS
650.0000 mg | ORAL_TABLET | Freq: Four times a day (QID) | ORAL | Status: DC | PRN
  Filled 2017-10-02: qty 2

## 2017-10-02 MED ORDER — VITAMIN D 1000 UNITS PO TABS
2000.0000 [IU] | ORAL_TABLET | Freq: Every day | ORAL | Status: DC
  Administered 2017-10-02 – 2017-10-05 (×4): 2000 [IU] via ORAL
  Filled 2017-10-02 (×4): qty 2

## 2017-10-02 MED ORDER — CLONAZEPAM 0.5 MG PO TABS
1.0000 mg | ORAL_TABLET | Freq: Every day | ORAL | Status: DC
  Administered 2017-10-02 – 2017-10-04 (×3): 1 mg via ORAL
  Filled 2017-10-02 (×3): qty 2

## 2017-10-02 MED ORDER — POTASSIUM CHLORIDE CRYS ER 20 MEQ PO TBCR
40.0000 meq | EXTENDED_RELEASE_TABLET | Freq: Every day | ORAL | Status: DC
  Administered 2017-10-02 – 2017-10-03 (×2): 40 meq via ORAL
  Filled 2017-10-02 (×3): qty 2

## 2017-10-02 MED ORDER — RISPERIDONE 1 MG PO TBDP
1.0000 mg | ORAL_TABLET | Freq: Every day | ORAL | Status: DC
  Administered 2017-10-02 – 2017-10-04 (×3): 1 mg via ORAL
  Filled 2017-10-02 (×5): qty 1

## 2017-10-02 MED ORDER — LORATADINE 10 MG PO TABS
10.0000 mg | ORAL_TABLET | Freq: Every day | ORAL | Status: DC
  Administered 2017-10-02 – 2017-10-05 (×4): 10 mg via ORAL
  Filled 2017-10-02 (×4): qty 1

## 2017-10-02 MED ORDER — TRAZODONE HCL 50 MG PO TABS
200.0000 mg | ORAL_TABLET | Freq: Every day | ORAL | Status: DC
  Administered 2017-10-02 – 2017-10-04 (×3): 200 mg via ORAL
  Filled 2017-10-02 (×3): qty 4

## 2017-10-02 MED ORDER — VITAMIN D3 50 MCG (2000 UT) PO CAPS: 2000.0000 [IU] | ORAL_CAPSULE | Freq: Every day | ORAL | Status: DC

## 2017-10-02 MED ORDER — LITHIUM CARBONATE ER 450 MG PO TBCR
450.0000 mg | EXTENDED_RELEASE_TABLET | Freq: Two times a day (BID) | ORAL | Status: DC
  Administered 2017-10-02 – 2017-10-05 (×6): 450 mg via ORAL
  Filled 2017-10-02 (×10): qty 1

## 2017-10-02 MED ORDER — PALIPERIDONE PALMITATE 156 MG/ML IM SUSP: 156.0000 mg | INTRAMUSCULAR | Status: DC

## 2017-10-02 MED ORDER — FLUTICASONE PROPIONATE 50 MCG/ACT NA SUSP
2.0000 | Freq: Every day | NASAL | Status: DC
  Administered 2017-10-02 – 2017-10-05 (×4): 2 via NASAL
  Filled 2017-10-02: qty 16

## 2017-10-02 MED ORDER — SODIUM CHLORIDE 0.9 % IV SOLN
INTRAVENOUS | Status: AC
  Administered 2017-10-02 – 2017-10-03 (×2): via INTRAVENOUS

## 2017-10-02 MED ORDER — LACTULOSE 10 GM/15ML PO SOLN
30.0000 g | Freq: Two times a day (BID) | ORAL | Status: DC
  Administered 2017-10-02 – 2017-10-05 (×6): 30 g via ORAL
  Filled 2017-10-02 (×6): qty 60

## 2017-10-02 MED ORDER — POLYETHYLENE GLYCOL 3350 17 G PO PACK
17.0000 g | PACK | Freq: Every day | ORAL | Status: DC
  Administered 2017-10-03 – 2017-10-05 (×3): 17 g via ORAL
  Filled 2017-10-02 (×3): qty 1

## 2017-10-02 MED ORDER — MIRTAZAPINE 15 MG PO TABS
30.0000 mg | ORAL_TABLET | Freq: Every day | ORAL | Status: DC
  Administered 2017-10-02 – 2017-10-04 (×3): 30 mg via ORAL
  Filled 2017-10-02 (×3): qty 2

## 2017-10-02 NOTE — ED Notes (Signed)
Report given to receiving RN. Patient ready for transport.

## 2017-10-02 NOTE — ED Triage Notes (Signed)
Patient here today from [facility] for not wanting to follow staff directions. Patient states he doesn't want to live at facility anymore. Patient states he has episodes of confusion. Strong smell of urine noted on patient.

## 2017-10-02 NOTE — ED Provider Notes (Signed)
[facility] EMERGENCY DEPARTMENT Provider Note   CSN: [id number] Arrival date & time: 10/02/17  1536     History   Chief Complaint Chief Complaint  Patient presents with  . Fever   Level 5 caveat patient not fully oriented HPI Ray Jackson is a 62 y.o. male.  [facility].  history is obtained from [employer], employee at [facility] [name] reports that this past week patient has been lying in bed in fetal position not wanting to get out of bed not eating or drinking.  He has had no fever.  She wants him to be "checked out" patient denies pain anywhere, denies shortness of breath denies chest pain or abdominal pain.  No other associated symptoms states he is presently hungry.  History of schizophrenia.  [name] reports to me that if patient is okay medically she will accept him back at group home.  Patient is okay with going back to group home.  HPI  Past Medical History:  Diagnosis Date  . Constipation   . Hypertension   . Hypokalemia   . Schizophrenia (HCC)     There are no active problems to display for this patient.   Past Surgical History:  Procedure Laterality Date  . TONSILLECTOMY         Home Medications    Prior to Admission medications   Medication Sig Start Date End Date Taking? Authorizing Provider  cetirizine (ZYRTEC) 10 MG tablet Take 10 mg by mouth at bedtime.     [provider]  Cholecalciferol (VITAMIN D3) 2000 units capsule Take 2,000 Units by mouth daily.    [provider]  clonazePAM (KLONOPIN) 1 MG tablet Take 1 tablet by mouth at bedtime. 09/12/17 10/12/17  [provider]  docusate sodium (COLACE) 100 MG capsule Take 100 mg by mouth 2 (two) times daily.    [provider]  finasteride (PROSCAR) 5 MG tablet Take 5 mg by mouth daily.    [provider]  fluticasone (FLONASE) 50 MCG/ACT nasal spray Place 2 sprays into both nostrils daily.    [provider]    hydrochlorothiazide (HYDRODIURIL) 25 MG tablet Take 25 mg by mouth daily.    [provider]  lactulose (CHRONULAC) 10 GM/15ML solution Take 30 g by mouth 2 (two) times daily.    [provider]  lithium carbonate (ESKALITH) 450 MG CR tablet Take 1 tablet by mouth every 12 (twelve) hours. 09/12/17 10/12/17  [provider]  mirtazapine (REMERON) 30 MG tablet Take 30 mg by mouth at bedtime.    [provider]  paliperidone (INVEGA SUSTENNA) 156 MG/ML SUSP injection Inject 156 mg into the muscle every 30 (thirty) days.    [provider]  polyethylene glycol (MIRALAX / GLYCOLAX) packet Take 17 g by mouth daily.    [provider]  potassium chloride SA (K-DUR,KLOR-CON) 20 MEQ tablet Take 2 tablets (40 mEq total) by mouth daily. Patient not taking: Reported on 09/14/2017 08/05/17   [name], MD  risperiDONE (RISPERDAL M-TABS) 1 MG disintegrating tablet Take 1 tablet by mouth at bedtime. 09/12/17 10/12/17  [provider]  tamsulosin (FLOMAX) 0.4 MG CAPS capsule Take 1 capsule by mouth daily. 09/13/17   [provider]  traZODone (DESYREL) 100 MG tablet Take 200 mg by mouth at bedtime.    [provider]  vitamin B-12 (CYANOCOBALAMIN) 1000 MCG tablet Take 1,000 mcg by mouth daily.    [provider]  Family History No family history on file.  Social History Social History   Tobacco Use  . Smoking status: Current Every Day Smoker    Packs/day: 0.50  . Smokeless tobacco: Never Used  Substance Use Topics  . Alcohol use: No    Frequency: Never  . Drug use: No     Allergies   Patient has no known allergies.   Review of Systems Review of Systems  Unable to perform ROS: Psychiatric disorder     Physical Exam Updated Vital Signs BP 119/66 (BP Location: Right Arm)   Pulse 82   Temp 99.7 F (37.6 C) (Oral)   Resp 18   Ht 6' (1.829 m)   Wt 59.4 kg (131 lb)   SpO2 97%   BMI 17.77 kg/m    Physical Exam  Constitutional:  Cachectic, chronically ill-appearing no distress pleasant cooperative  HENT:  Head: Normocephalic and atraumatic.  Eyes: Conjunctivae are normal. Pupils are equal, round, and reactive to light.  Neck: Neck supple. No tracheal deviation present. No thyromegaly present.  Cardiovascular: Normal rate and regular rhythm.  No murmur heard. Pulmonary/Chest: Effort normal and breath sounds normal.  Abdominal: Soft. Bowel sounds are normal. He exhibits no distension. There is no tenderness.  Musculoskeletal: Normal range of motion. He exhibits no edema or tenderness.  Neurological: He is alert. Coordination normal.  Oriented to name and hospital does not know date gait normal not lightheaded on standing  Skin: Skin is warm and dry. No rash noted.  Psychiatric: He has a normal mood and affect.  Nursing note and vitals reviewed.    ED Treatments / Results  Labs (all labs ordered are listed, but only abnormal results are displayed) Labs Reviewed  URINALYSIS, ROUTINE W REFLEX MICROSCOPIC  COMPREHENSIVE METABOLIC PANEL  ETHANOL  RAPID URINE DRUG SCREEN, HOSP PERFORMED  CBC WITH DIFFERENTIAL/PLATELET  URINALYSIS, COMPLETE (UACMP) WITH MICROSCOPIC  LITHIUM LEVEL    EKG  EKG Interpretation None       Radiology No results found.  Procedures Procedures (including critical care time)  Medications Ordered in ED Medications - No data to display  Results for orders placed or performed during the hospital encounter of 10/02/17  Comprehensive metabolic panel  Result Value Ref Range   Sodium 126 (L) 135 - 145 mmol/L   Potassium 3.5 3.5 - 5.1 mmol/L   Chloride 90 (L) 101 - 111 mmol/L   CO2 26 22 - 32 mmol/L   Glucose, Bld 158 (H) 65 - 99 mg/dL   BUN 21 (H) 6 - 20 mg/dL   Creatinine, Ser 0.86 (H) 0.61 - 1.24 mg/dL   Calcium 8.4 (L) 8.9 - 10.3 mg/dL   Total Protein 6.2 (L) 6.5 - 8.1 g/dL   Albumin 2.4 (L) 3.5 - 5.0 g/dL   AST 13 (L) 15 - 41 U/L    ALT 10 (L) 17 - 63 U/L   Alkaline Phosphatase 53 38 - 126 U/L   Total Bilirubin 0.5 0.3 - 1.2 mg/dL   GFR calc non Af Amer 45 (L) >60 mL/min   GFR calc Af Amer 52 (L) >60 mL/min   Anion gap 10 5 - 15  Ethanol  Result Value Ref Range   Alcohol, Ethyl (B) <10 <10 mg/dL  Urine rapid drug screen (hosp performed)  Result Value Ref Range   Opiates NONE DETECTED NONE DETECTED   Cocaine NONE DETECTED NONE DETECTED   Benzodiazepines NONE DETECTED NONE DETECTED   Amphetamines NONE DETECTED NONE DETECTED   Tetrahydrocannabinol  NONE DETECTED NONE DETECTED   Barbiturates NONE DETECTED NONE DETECTED  CBC with Diff  Result Value Ref Range   WBC 16.5 (H) 4.0 - 10.5 K/uL   RBC 3.38 (L) 4.22 - 5.81 MIL/uL   Hemoglobin 10.8 (L) 13.0 - 17.0 g/dL   HCT 16.134.3 (L) 09.639.0 - 04.552.0 %   MCV 101.5 (H) 78.0 - 100.0 fL   MCH 32.0 26.0 - 34.0 pg   MCHC 31.5 30.0 - 36.0 g/dL   RDW 40.914.8 81.111.5 - 91.415.5 %   Platelets 427 (H) 150 - 400 K/uL   Neutrophils Relative % 80 %   Neutro Abs 13.3 (H) 1.7 - 7.7 K/uL   Lymphocytes Relative 8 %   Lymphs Abs 1.2 0.7 - 4.0 K/uL   Monocytes Relative 12 %   Monocytes Absolute 1.9 (H) 0.1 - 1.0 K/uL   Eosinophils Relative 0 %   Eosinophils Absolute 0.0 0.0 - 0.7 K/uL   Basophils Relative 0 %   Basophils Absolute 0.0 0.0 - 0.1 K/uL  Urinalysis, Complete w Microscopic  Result Value Ref Range   Color, Urine YELLOW YELLOW   APPearance CLOUDY (A) CLEAR   Specific Gravity, Urine 1.006 1.005 - 1.030   pH 6.0 5.0 - 8.0   Glucose, UA NEGATIVE NEGATIVE mg/dL   Hgb urine dipstick SMALL (A) NEGATIVE   Bilirubin Urine NEGATIVE NEGATIVE   Ketones, ur NEGATIVE NEGATIVE mg/dL   Protein, ur 30 (A) NEGATIVE mg/dL   Nitrite NEGATIVE NEGATIVE   Leukocytes, UA LARGE (A) NEGATIVE   RBC / HPF 6-30 0 - 5 RBC/hpf   WBC, UA TOO NUMEROUS TO COUNT 0 - 5 WBC/hpf   Bacteria, UA FEW (A) NONE SEEN   Squamous Epithelial / LPF NONE SEEN NONE SEEN   WBC Clumps PRESENT    Non Squamous Epithelial 0-5 (A)  NONE SEEN  Lithium level  Result Value Ref Range   Lithium Lvl 0.66 0.60 - 1.20 mmol/L   No results found. Initial Impression / Assessment and Plan / ED Course  I have reviewed the triage vital signs and the nursing notes.  Pertinent labs & imaging results that were available during my care of the patient were reviewed by me and considered in my medical decision making (see chart for details).    Patient ate a meal while here. In light of urinary tract infection, hyponatremia, acute on chronic renal insufficiency and likely dehydration due to patient's not eating and drinking will admit for IV hydration and antibiotics.  Intravenous Rocephin and IV normal saline bolus ordered by me.  Dr. Selena BattenKim consulted and will arrange for overnight stay urine sent for culture  Final Clinical Impressions(s) / ED Diagnoses  Diagnoses #1 urinary tract infection with pyuria #2 acute on chronic renal insufficiency #3 dehydration #4 hyponatremia #5 anemia Final diagnoses:  None    ED Discharge Orders    None       Doug SouJacubowitz, Masako Overall, MD 10/02/17 1836

## 2017-10-02 NOTE — H&P (Signed)
TRH H&P   Patient Demographics:    Ray Jackson, is a 62 y.o. male  MRN: [id number]   DOB - 21-Jun-1956  Admit Date - 10/02/2017  Outpatient Primary MD for the patient is Anselm Jungling, NP  Referring MD/NP/PA:   Arvella Merles  Outpatient Specialists:     Patient coming from: home  Chief Complaint  Patient presents with  . Fever      HPI:    Ray Jackson  is a 62 y.o. male, w  Hypertension, Schizophrenia, who presents with decrease in eating and drinking and is sent to ER from group home for evaluation.   In ED   CXR IMPRESSION: 1. No acute cardiopulmonary disease. 2. COPD.  Na 126, K 3.5, Bun 21, Creatinine 1.58 Alb 2.4 Ast 13 ,Alt 10  Etoh <10  Wbc 16.5, Hgb 10.8, Plt 427  Lithium 0.66  UDS negative Urine wbc tntc, rbc 6-30  Pt will be admitted for UTI, hyponatremia, dehydration, ARF.        Review of systems:    In addition to the HPI above,  No Fever-chills, No Headache, No changes with Vision or hearing, No problems swallowing food or Liquids, No Chest pain, Cough or Shortness of Breath, No Abdominal pain, No Nausea or Vommitting, Bowel movements are regular, No Blood in stool or Urine, No dysuria, No new skin rashes or bruises, No new joints pains-aches,  No new weakness, tingling, numbness in any extremity, No recent weight gain or loss, No polyuria, polydypsia or polyphagia, No significant Mental Stressors.  A full 10 point Review of Systems was done, except as stated above, all other Review of Systems were negative.   With Past History of the following :    Past Medical History:  Diagnosis Date  . Constipation   . Hypertension   . Hypokalemia   . Schizophrenia [facility])       Past Surgical History:  Procedure Laterality Date  . TONSILLECTOMY        Social History:     Social History   Tobacco Use  . Smoking status:  Current Every Day Smoker    Packs/day: 0.50  . Smokeless tobacco: Never Used  Substance Use Topics  . Alcohol use: No    Frequency: Never     Lives - at home  Mobility - walks by self   Family History :    History reviewed. No pertinent family history. Pt unable to provide family hx    Home Medications:   Prior to Admission medications   Medication Sig Start Date End Date Taking? Authorizing Provider  cetirizine (ZYRTEC) 10 MG tablet Take 10 mg by mouth at bedtime.     [provider]  Cholecalciferol (VITAMIN D3) 2000 units capsule Take 2,000 Units by mouth daily.    [provider]  clonazePAM (KLONOPIN) 1 MG tablet Take 1 tablet by mouth  at bedtime. 09/12/17 10/12/17  [provider]  docusate sodium (COLACE) 100 MG capsule Take 100 mg by mouth 2 (two) times daily.    [provider]  finasteride (PROSCAR) 5 MG tablet Take 5 mg by mouth daily.    [provider]  fluticasone (FLONASE) 50 MCG/ACT nasal spray Place 2 sprays into both nostrils daily.    [provider]  hydrochlorothiazide (HYDRODIURIL) 25 MG tablet Take 25 mg by mouth daily.    [provider]  lactulose (CHRONULAC) 10 GM/15ML solution Take 30 g by mouth 2 (two) times daily.    [provider]  lithium carbonate (ESKALITH) 450 MG CR tablet Take 1 tablet by mouth every 12 (twelve) hours. 09/12/17 10/12/17  [provider]  mirtazapine (REMERON) 30 MG tablet Take 30 mg by mouth at bedtime.    [provider]  paliperidone (INVEGA SUSTENNA) 156 MG/ML SUSP injection Inject 156 mg into the muscle every 30 (thirty) days.    [provider]  polyethylene glycol (MIRALAX / GLYCOLAX) packet Take 17 g by mouth daily.    [provider]  potassium chloride SA (K-DUR,KLOR-CON) 20 MEQ tablet Take 2 tablets (40 mEq total) by mouth daily. Patient not taking: Reported on 09/14/2017 08/05/17   Lavera GuiseLiu, Dana Duo, MD  risperiDONE  (RISPERDAL M-TABS) 1 MG disintegrating tablet Take 1 tablet by mouth at bedtime. 09/12/17 10/12/17  [provider]  tamsulosin (FLOMAX) 0.4 MG CAPS capsule Take 1 capsule by mouth daily. 09/13/17   [provider]  traZODone (DESYREL) 100 MG tablet Take 200 mg by mouth at bedtime.    [provider]  vitamin B-12 (CYANOCOBALAMIN) 1000 MCG tablet Take 1,000 mcg by mouth daily.    [provider]     Allergies:    No Known Allergies   Physical Exam:   Vitals  Blood pressure 119/66, pulse 82, temperature 99.7 F (37.6 C), temperature source Oral, resp. rate 18, height 6' (1.829 m), weight 59.4 kg (131 lb), SpO2 97 %.   1. General lying in bed in NAD,   2. Normal affect and insight, Not Suicidal or Homicidal, Awake Alert, Oriented X 1, pt will comply with interview.  3. No F.N deficits, ALL C.Nerves Intact, Strength 5/5 all 4 extremities, Sensation intact all 4 extremities, Plantars down going.  4. Ears and Eyes appear Normal, Conjunctivae clear, PERRLA. Moist Oral Mucosa.  5. Supple Neck, No JVD, No cervical lymphadenopathy appriciated, No Carotid Bruits.  6. Symmetrical Chest wall movement, Good air movement bilaterally, CTAB.  7. RRR, No Gallops, Rubs or Murmurs, No Parasternal Heave.  8. Positive Bowel Sounds, Abdomen Soft, No tenderness, No organomegaly appriciated,No rebound -guarding or rigidity.  9.  No Cyanosis, Normal Skin Turgor, No Skin Rash or Bruise.  10. Good muscle tone,  joints appear normal , no effusions, Normal ROM.  11. No Palpable Lymph Nodes in Neck or Axillae    Data Review:    CBC Recent Labs  Lab 10/02/17 1629  WBC 16.5*  HGB 10.8*  HCT 34.3*  PLT 427*  MCV 101.5*  MCH 32.0  MCHC 31.5  RDW 14.8  LYMPHSABS 1.2  MONOABS 1.9*  EOSABS 0.0  BASOSABS 0.0   ------------------------------------------------------------------------------------------------------------------  Chemistries  Recent Labs  Lab  10/02/17 1629  NA 126*  K 3.5  CL 90*  CO2 26  GLUCOSE 158*  BUN 21*  CREATININE 1.58*  CALCIUM 8.4*  AST 13*  ALT 10*  ALKPHOS 53  BILITOT 0.5   ------------------------------------------------------------------------------------------------------------------  estimated creatinine clearance is 40.7 mL/min (A) (by C-G formula based on SCr of 1.58 mg/dL (H)). ------------------------------------------------------------------------------------------------------------------ No results for input(s): TSH, T4TOTAL, T3FREE, THYROIDAB in the last 72 hours.  Invalid input(s): FREET3  Coagulation profile No results for input(s): INR, PROTIME in the last 168 hours. ------------------------------------------------------------------------------------------------------------------- No results for input(s): DDIMER in the last 72 hours. -------------------------------------------------------------------------------------------------------------------  Cardiac Enzymes No results for input(s): CKMB, TROPONINI, MYOGLOBIN in the last 168 hours.  Invalid input(s): CK ------------------------------------------------------------------------------------------------------------------ No results found for: BNP   ---------------------------------------------------------------------------------------------------------------  Urinalysis    Component Value Date/Time   COLORURINE YELLOW 10/02/2017 1732   APPEARANCEUR CLOUDY (A) 10/02/2017 1732   APPEARANCEUR Clear 04/01/2014 0703   LABSPEC 1.006 10/02/2017 1732   LABSPEC 1.006 04/01/2014 0703   PHURINE 6.0 10/02/2017 1732   GLUCOSEU NEGATIVE 10/02/2017 1732   GLUCOSEU Negative 04/01/2014 0703   HGBUR SMALL (A) 10/02/2017 1732   BILIRUBINUR NEGATIVE 10/02/2017 1732   BILIRUBINUR Negative 04/01/2014 0703   KETONESUR NEGATIVE 10/02/2017 1732   PROTEINUR 30 (A) 10/02/2017 1732   NITRITE NEGATIVE 10/02/2017 1732   LEUKOCYTESUR LARGE (A) 10/02/2017  1732   LEUKOCYTESUR Negative 04/01/2014 0703    ----------------------------------------------------------------------------------------------------------------   Imaging Results:    No results found.    Assessment & Plan:    Principal Problem:   UTI (urinary tract infection) Active Problems:   ARF (acute renal failure) (HCC)   Hyponatremia   Anemia    UTI Blood culture x2 Await urine culture  ARF Check urine sodium, urine creatinine, urine eosinophils Renal ultrasound HOLD hydrochlorothiaizde Hydrate with ns iv  Hyponatremia Check serum osm, cortisol, tsh Check urine sodium, urine osm Hydrate with ns iv Check cmp Rocephin 1gm iv qday  Anemia Check cbc in am  Schizophrenia Cont lithium Cont invega Cont risperdal  Anxiety Cont clonazepam     DVT Prophylaxis Lovenox - SCDs  AM Labs Ordered, also please review Full Orders  Family Communication: Admission, patients condition and plan of care including tests being ordered have been discussed with the patient  who indicate understanding and agree with the plan and Code Status.  Code Status FULL CODE  Likely DC to  home  Condition GUARDED    Consults called: none  Admission status: inpatient   Time spent in minutes : 45   Pearson Grippe M.D on 10/02/2017 at 7:13 PM  Between 7am to 7pm - Pager - [phone]  . After 7pm go to [email or web address] - password [facility]  [facility] - Office  [phone]

## 2017-10-02 NOTE — ED Notes (Signed)
Patient assisted to bathroom and urine sample obtained.

## 2017-10-03 ENCOUNTER — Inpatient Hospital Stay (HOSPITAL_COMMUNITY): Payer: Medicaid Other

## 2017-10-03 LAB — MAGNESIUM: MAGNESIUM: 1.9 mg/dL (ref 1.7–2.4)

## 2017-10-03 LAB — CBC
HEMATOCRIT: 32.4 % — AB (ref 39.0–52.0)
HEMOGLOBIN: 10.4 g/dL — AB (ref 13.0–17.0)
MCH: 32.4 pg (ref 26.0–34.0)
MCHC: 32.1 g/dL (ref 30.0–36.0)
MCV: 100.9 fL — AB (ref 78.0–100.0)
Platelets: 405 10*3/uL — ABNORMAL HIGH (ref 150–400)
RBC: 3.21 MIL/uL — ABNORMAL LOW (ref 4.22–5.81)
RDW: 14.9 % (ref 11.5–15.5)
WBC: 17.5 10*3/uL — ABNORMAL HIGH (ref 4.0–10.5)

## 2017-10-03 LAB — COMPREHENSIVE METABOLIC PANEL
ALBUMIN: 2.2 g/dL — AB (ref 3.5–5.0)
ALT: 13 U/L — ABNORMAL LOW (ref 17–63)
ANION GAP: 9 (ref 5–15)
AST: 16 U/L (ref 15–41)
Alkaline Phosphatase: 53 U/L (ref 38–126)
BILIRUBIN TOTAL: 0.4 mg/dL (ref 0.3–1.2)
BUN: 22 mg/dL — AB (ref 6–20)
CO2: 25 mmol/L (ref 22–32)
Calcium: 8.6 mg/dL — ABNORMAL LOW (ref 8.9–10.3)
Chloride: 101 mmol/L (ref 101–111)
Creatinine, Ser: 1.16 mg/dL (ref 0.61–1.24)
GFR calc Af Amer: >60 mL/min (ref >60)
GFR calc non Af Amer: >60 mL/min (ref >60)
GLUCOSE: 100 mg/dL — AB (ref 65–99)
Potassium: 3.4 mmol/L — ABNORMAL LOW (ref 3.5–5.1)
SODIUM: 135 mmol/L (ref 135–145)
TOTAL PROTEIN: 5.7 g/dL — AB (ref 6.5–8.1)

## 2017-10-03 LAB — CORTISOL: CORTISOL PLASMA: 21.3 ug/dL

## 2017-10-03 LAB — OSMOLALITY: Osmolality: 282 mOsm/kg (ref 275–295)

## 2017-10-03 LAB — MRSA PCR SCREENING: MRSA by PCR: NEGATIVE

## 2017-10-03 LAB — TSH: TSH: 2.818 u[IU]/mL (ref 0.350–4.500)

## 2017-10-03 MED ORDER — ENOXAPARIN SODIUM 40 MG/0.4ML ~~LOC~~ SOLN
40.0000 mg | SUBCUTANEOUS | Status: DC
  Administered 2017-10-03 – 2017-10-04 (×2): 40 mg via SUBCUTANEOUS
  Filled 2017-10-03 (×2): qty 0.4

## 2017-10-03 NOTE — Progress Notes (Signed)
PROGRESS NOTE   Ray Jackson  NWG:[id number]  DOB: 1956/03/31  DOA: 10/02/2017 PCP: Anselm JunglingHatchett, Mary, NP  Brief Admission Hx:   Ray Jackson  is a 62 y.o. male, w  Hypertension, Schizophrenia, who presents with decrease in eating and drinking and is sent to ER from group home for evaluation.  He was noted to be dehydrated and positive for a urinary tract infection.  MDM/Assessment & Plan:   1. UTI -continue IV ceftriaxone pending urine cultures.  Blood cultures x2 pending. 2. Acute renal failure-suspect prerenal, holding hydrochlorothiazide, gently hydrating with IV fluids, renal ultrasound pending.  Follow renal function panel. 3. Hyponatremia-secondary to dehydration, hydrate with normal saline and hold hydrochlorothiazide. 4. Anemia, unspecified- follow CBC closely. 5. Schizophrenia-resume home medications lithium, Invega, Risperdal. 6. Generalized anxiety disorder-continue clonazepam as ordered. 7. Hypokalemia - replacement ordered, check magnesium.   8. Leukocytosis - secondary to UTI, following CBC/diff.   DVT prophylaxis: SCDs and Lovenox Code Status: Full code Family Communication: None present Disposition Plan: Return to group home when medically stable  Subjective: The patient is without complaints this morning.  Objective: Vitals:   10/02/17 2025 10/02/17 2030 10/02/17 2122 10/03/17 0432  BP: (!) 115/56 122/61 (!) 154/84 107/64  Pulse: 71 71 82 76  Resp: 18  18 14   Temp:   100.3 F (37.9 C) 100.2 F (37.9 C)  TempSrc:   Oral Oral  SpO2: 98% 98% 97% 94%  Weight:   57.7 kg (127 lb 3.3 oz) 57.6 kg (126 lb 15.8 oz)  Height:   6' (1.829 m)     Intake/Output Summary (Last 24 hours) at 10/03/2017 1023 Last data filed at 10/03/2017 0302 Gross per 24 hour  Intake 333.75 ml  Output -  Net 333.75 ml   Filed Weights   10/02/17 1545 10/02/17 2122 10/03/17 0432  Weight: 59.4 kg (131 lb) 57.7 kg (127 lb 3.3 oz) 57.6 kg (126 lb 15.8 oz)     REVIEW OF SYSTEMS  As per  history otherwise all reviewed and reported negative  Exam:  General exam: awake, NAD, cooperative. MM DRY Respiratory system: Clear. No increased work of breathing. Cardiovascular system: S1 & S2 heard, RRR. No JVD, murmurs, gallops, clicks or pedal edema. Gastrointestinal system: Abdomen is nondistended, soft and nontender. Normal bowel sounds heard. Central nervous system: Alert and oriented. No focal neurological deficits. Extremities: no CCE.  Data Reviewed: Basic Metabolic Panel: Recent Labs  Lab 10/02/17 1629 10/03/17 0355  NA 126* 135  K 3.5 3.4*  CL 90* 101  CO2 26 25  GLUCOSE 158* 100*  BUN 21* 22*  CREATININE 1.58* 1.16  CALCIUM 8.4* 8.6*   Liver Function Tests: Recent Labs  Lab 10/02/17 1629 10/03/17 0355  AST 13* 16  ALT 10* 13*  ALKPHOS 53 53  BILITOT 0.5 0.4  PROT 6.2* 5.7*  ALBUMIN 2.4* 2.2*   No results for input(s): LIPASE, AMYLASE in the last 168 hours. No results for input(s): AMMONIA in the last 168 hours. CBC: Recent Labs  Lab 10/02/17 1629 10/03/17 0355  WBC 16.5* 17.5*  NEUTROABS 13.3*  --   HGB 10.8* 10.4*  HCT 34.3* 32.4*  MCV 101.5* 100.9*  PLT 427* 405*   Cardiac Enzymes: No results for input(s): CKTOTAL, CKMB, CKMBINDEX, TROPONINI in the last 168 hours. CBG (last 3)  No results for input(s): GLUCAP in the last 72 hours. Recent Results (from the past 240 hour(s))  MRSA PCR Screening     Status: None   Collection Time:  10/02/17 10:22 PM  Result Value Ref Range Status   MRSA by PCR NEGATIVE NEGATIVE Final    Comment:        The GeneXpert MRSA Assay (FDA approved for NASAL specimens only), is one component of a comprehensive MRSA colonization surveillance program. It is not intended to diagnose MRSA infection nor to guide or monitor treatment for MRSA infections. Performed at [facility], [address] [place], [place] [address]     Studies: No results found.  Scheduled Meds: . cholecalciferol  2,000 Units Oral  Daily  . clonazePAM  1 mg Oral QHS  . docusate sodium  100 mg Oral BID  . enoxaparin (LOVENOX) injection  30 mg Subcutaneous Q24H  . finasteride  5 mg Oral Daily  . fluticasone  2 spray Each Nare Daily  . lactulose  30 g Oral BID  . lithium carbonate  450 mg Oral Q12H  . loratadine  10 mg Oral Daily  . mirtazapine  30 mg Oral QHS  . polyethylene glycol  17 g Oral Daily  . potassium chloride SA  40 mEq Oral Daily  . risperiDONE  1 mg Oral QHS  . tamsulosin  0.4 mg Oral Daily  . traZODone  200 mg Oral QHS  . vitamin B-12  1,000 mcg Oral Daily   Continuous Infusions: . sodium chloride 75 mL/hr at 10/02/17 2235  . cefTRIAXone (ROCEPHIN)  IV      Principal Problem:   UTI (urinary tract infection) Active Problems:   ARF (acute renal failure) (HCC)   Hyponatremia   Anemia  Time spent:   Standley Dakins, MD, FAAFP [facility] Pager [phone]  If 7PM-7AM, please contact night-coverage [email or web address] Password [email or web address] 10/03/2017, 10:23 AM    LOS: 1 day

## 2017-10-04 ENCOUNTER — Encounter (HOSPITAL_COMMUNITY): Payer: Self-pay | Admitting: Family Medicine

## 2017-10-04 LAB — CBC WITH DIFFERENTIAL/PLATELET
Basophils Absolute: 0 10*3/uL (ref 0.0–0.1)
Basophils Relative: 0 %
Eosinophils Absolute: 0.3 10*3/uL (ref 0.0–0.7)
Eosinophils Relative: 2 %
HEMATOCRIT: 31.6 % — AB (ref 39.0–52.0)
HEMOGLOBIN: 9.8 g/dL — AB (ref 13.0–17.0)
LYMPHS ABS: 1.8 10*3/uL (ref 0.7–4.0)
LYMPHS PCT: 14 %
MCH: 31.7 pg (ref 26.0–34.0)
MCHC: 31 g/dL (ref 30.0–36.0)
MCV: 102.3 fL — AB (ref 78.0–100.0)
MONOS PCT: 12 %
Monocytes Absolute: 1.6 10*3/uL — ABNORMAL HIGH (ref 0.1–1.0)
NEUTROS ABS: 9.3 10*3/uL — AB (ref 1.7–7.7)
NEUTROS PCT: 72 %
Platelets: 375 10*3/uL (ref 150–400)
RBC: 3.09 MIL/uL — AB (ref 4.22–5.81)
RDW: 15.2 % (ref 11.5–15.5)
WBC: 13 10*3/uL — AB (ref 4.0–10.5)

## 2017-10-04 LAB — BASIC METABOLIC PANEL
Anion gap: 6 (ref 5–15)
BUN: 17 mg/dL (ref 6–20)
CHLORIDE: 105 mmol/L (ref 101–111)
CO2: 26 mmol/L (ref 22–32)
CREATININE: 1.01 mg/dL (ref 0.61–1.24)
Calcium: 8.5 mg/dL — ABNORMAL LOW (ref 8.9–10.3)
GFR calc Af Amer: >60 mL/min (ref >60)
GFR calc non Af Amer: >60 mL/min (ref >60)
Glucose, Bld: 100 mg/dL — ABNORMAL HIGH (ref 65–99)
POTASSIUM: 3.9 mmol/L (ref 3.5–5.1)
Sodium: 137 mmol/L (ref 135–145)

## 2017-10-04 LAB — MAGNESIUM: Magnesium: 1.9 mg/dL (ref 1.7–2.4)

## 2017-10-04 LAB — CALCIUM / CREATININE RATIO, URINE
Calcium, Ur: 1.7 mg/dL
Calcium/Creat.Ratio: 30 mg/g creat (ref 0–260)
Creatinine, Urine: 55.9 mg/dL

## 2017-10-04 LAB — HIV ANTIBODY (ROUTINE TESTING W REFLEX): HIV Screen 4th Generation wRfx: NONREACTIVE

## 2017-10-04 MED ORDER — SALINE SPRAY 0.65 % NA SOLN: 1.0000 | NASAL | Status: DC | PRN

## 2017-10-04 MED ORDER — CEFPODOXIME PROXETIL 200 MG PO TABS: 200.0000 mg | ORAL_TABLET | Freq: Two times a day (BID) | ORAL | 0 refills | Status: AC

## 2017-10-04 NOTE — Clinical Social Work Note (Addendum)
Clinical Social Work Assessment  Patient Details  Name: Ray Jackson MRN: [id number] Date of Birth: 1956-01-14  Date of referral:  10/04/17               Reason for consult:  Discharge Planning                Permission sought to share information with:    Permission granted to share information::     Name::        Agency::     Relationship::     Contact Information:     Housing/Transportation Living arrangements for the past 2 months:  Group Home([facility]) Source of Information:  Facility Patient Interpreter Needed:  None Criminal Activity/Legal Involvement Pertinent to Current Situation/Hospitalization:  No - Comment as needed Significant Relationships:  Siblings Lives with:  Facility Resident Do you feel safe going back to the place where you live?  Yes Need for family participation in patient care:  Yes (Comment)  Care giving concerns: Pt lives at [facility]   Social Worker assessment / plan: Pt is a 62 year old male admitted with UTI. Pt resides in a group home. Pt's sister is his guardian. Per MD, pt may be stable for dc over the weekend and he will be on a new antibiotic at dc. Spoke with pt's group home manager who stated that the new RX would need to be to their pharmacy [facility] today in order to get it delivered for the weekend. SW [facility] with the pharmacist, [facility] [phone]) to discuss. Per Victorino DikeJennifer, if they have the RX by 3:45, they can deliver it for the weekend. If not, they will not be able to deliver it until Monday. Updated MD who stated that pt's culture results are not back yet so antibiotic option is not yet known. MD states that if he has the update by 3:45, he will escribe the medication to [facility]. [name] to update her.  [facility] will need FL2 provided at dc. Fax is [phone] but the information cannot be faxed [facility] as the wrong number is in there. So, it either  needs to be printed and faxed, or, printed and provided to the caregivers.   Updated pt's sister by phone today. Her cell number is [phone] and she asks to be contacted when pt is stable for dc. The group home will transport and they will need to be called when pt is discharged.   Weekend SW will be available if needed. Will follow up Monday if pt not discharged over the weekend.  Employment status:  Disabled (Comment on whether or not currently receiving Disability) Insurance information:  [place] Recommend[place]:  Not assessed at this time Information / Referral to community resources:     Patient/Family's Response to care: Pt and family accepting of care.  Patient/Family's Understanding of and Emotional Response to Diagnosis, Current Treatment, and Prognosis: Pt's sister has a good understanding of pt's diagnosis and treatment recommendations. She did not have any emotional concerns.  Emotional Assessment Appearance:  Appears stated age Attitude/Demeanor/Rapport:  Unable to Assess Affect (typically observed):  Stable Orientation:  Oriented to Self Alcohol / Substance use:  Not Applicable Psych involvement (Current and /or in the community):  No (Comment)  Discharge Needs  Concerns to be addressed:  Discharge Planning Concerns Readmission within the last 30 days:  No Current discharge risk:  None Barriers  to Discharge:  No Barriers Identified   Elliot Gault, LCSW 10/04/2017, 11:39 AM

## 2017-10-04 NOTE — Progress Notes (Signed)
PROGRESS NOTE   Ray Jackson  ZHY:[id number]  DOB: 10-17-1955  DOA: 10/02/2017 PCP: Anselm Jungling, NP  Brief Admission Hx:  Ray Jackson  is a 62 y.o. male, w  Hypertension, Schizophrenia, who presents with decrease in eating and drinking and is sent to ER from group home for evaluation.  He was noted to be dehydrated and positive for a urinary tract infection.  MDM/Assessment & Plan:   1. Cystitis and Pyelonephritis -continue IV ceftriaxone. Continue supportive care and hydrating with IV fluids, renal ultrasound reports signs of pyelonephritis and will ask nurse to flush foley to be sure it is draining well as bladder was noted to have urine despite foley in place. 2. Hyponatremia-improved, secondary to dehydration, hydrate with normal saline and hold hydrochlorothiazide. 3. Anemia, unspecified- follow CBC closely, some hemodilution effect seen. 4. Schizophrenia-resume home medications lithium, Invega, Risperdal. 5. Generalized anxiety disorder-continue clonazepam as ordered. 6. Hypokalemia - replacement ordered, check magnesium.   7. Leukocytosis - WBC trending down, secondary to UTI, following CBC/diff.  8. Acute renal failure - Improved with IVF hydration.    DVT prophylaxis: SCDs and Lovenox Code Status: Full code Family Communication: None present Disposition Plan: Return to group home when medically stable, hopefully over weekend.   Subjective: The patient had some back pain this morning.    Objective: Vitals:   10/03/17 1500 10/03/17 2145 10/04/17 0500 10/04/17 0534  BP: 109/65 110/70  118/64  Pulse: 81 88  78  Resp: 16 16  16   Temp: 99.8 F (37.7 C) 98.2 F (36.8 C)  98.2 F (36.8 C)  TempSrc:  Oral  Oral  SpO2: 96% 95%  94%  Weight:   59.2 kg (130 lb 8.2 oz)   Height:        Intake/Output Summary (Last 24 hours) at 10/04/2017 1128 Last data filed at 10/04/2017 0900 Gross per 24 hour  Intake 460 ml  Output 2750 ml  Net -2290 ml   Filed Weights   10/02/17  2122 10/03/17 0432 10/04/17 0500  Weight: 57.7 kg (127 lb 3.3 oz) 57.6 kg (126 lb 15.8 oz) 59.2 kg (130 lb 8.2 oz)   REVIEW OF SYSTEMS  As per history otherwise all reviewed and reported negative  Exam:  General exam: awake, NAD, cooperative. MMM.  Respiratory system: Clear. No increased work of breathing. Cardiovascular system: S1 & S2 heard, RRR. No JVD, murmurs, gallops, clicks or pedal edema. Gastrointestinal system: Abdomen is nondistended, soft and nontender. Normal bowel sounds heard. Central nervous system: Alert and oriented. No focal neurological deficits. Extremities: no CCE.  Data Reviewed: Basic Metabolic Panel: Recent Labs  Lab 10/02/17 1629 10/03/17 0355 10/04/17 0636  NA 126* 135 137  K 3.5 3.4* 3.9  CL 90* 101 105  CO2 26 25 26   GLUCOSE 158* 100* 100*  BUN 21* 22* 17  CREATININE 1.58* 1.16 1.01  CALCIUM 8.4* 8.6* 8.5*  MG  --  1.9 1.9   Liver Function Tests: Recent Labs  Lab 10/02/17 1629 10/03/17 0355  AST 13* 16  ALT 10* 13*  ALKPHOS 53 53  BILITOT 0.5 0.4  PROT 6.2* 5.7*  ALBUMIN 2.4* 2.2*   No results for input(s): LIPASE, AMYLASE in the last 168 hours. No results for input(s): AMMONIA in the last 168 hours. CBC: Recent Labs  Lab 10/02/17 1629 10/03/17 0355 10/04/17 0636  WBC 16.5* 17.5* 13.0*  NEUTROABS 13.3*  --  9.3*  HGB 10.8* 10.4* 9.8*  HCT 34.3* 32.4* 31.6*  MCV 101.5* 100.9*  102.3*  PLT 427* 405* 375   Cardiac Enzymes: No results for input(s): CKTOTAL, CKMB, CKMBINDEX, TROPONINI in the last 168 hours. CBG (last 3)  No results for input(s): GLUCAP in the last 72 hours. Recent Results (from the past 240 hour(s))  MRSA PCR Screening     Status: None   Collection Time: 10/02/17 10:22 PM  Result Value Ref Range Status   MRSA by PCR NEGATIVE NEGATIVE Final    Comment:        The GeneXpert MRSA Assay (FDA approved for NASAL specimens only), is one component of a comprehensive MRSA colonization surveillance program. It is  not intended to diagnose MRSA infection nor to guide or monitor treatment for MRSA infections. Performed at [facility], [address] [place], [place] [address]     Studies: [place] Renal  Result Date: 10/03/2017 CLINICAL DATA:  Acute renal failure with urinary tract infection EXAM: RENAL / URINARY TRACT ULTRASOUND COMPLETE COMPARISON:  None. FINDINGS: Right Kidney: Length: 12.0 cm. Renal echogenicity overall is somewhat increased. There is inhomogeneous echogenicity in the upper pole the right kidney without a well-defined mass in this region. There is slight fullness of the right renal collecting system. There is a cyst in the lower pole of the right kidney measuring 1.7 x 1.7 x 1.6 cm. There is a nearby cyst in the lower pole region on the right measuring 0.5 x 0.5 x 0.6 cm. No perinephric fluid evident. No ureterectasis or well-defined sonographically demonstrable calculus noted. Left Kidney: Length: 8.2 cm. Echogenicity is increased. Renal cortical thickness normal. No mass, perinephric fluid, or hydronephrosis visualized. No sonographically demonstrable calculus or ureterectasis. Bladder: Urinary bladder appears distended with apparent debris within the urinary bladder. By report, there is a Foley catheter in the bladder. IMPRESSION: 1. There is relative increased echogenicity in a portion of the upper pole of the right kidney without well-defined mass. This appearance raises concern for acute lobar nephronia/bacterial nephritis. No frank abscess. No surrounding pericholecystic fluid. 2. There is slight fullness of the right renal collecting system, a finding that may be associated with pyelonephritis/acute lobar nephronia. 3. Apparent debris in urinary bladder. Suspect cystitis. There is significant amount of urine in the bladder despite reported presence of Foley catheter. Question nonfunctioning or possible malposition of Foley catheter. 4. Both kidneys are echogenic, a finding that is felt to be  indicative of underlying medical renal disease. Renal cortical thickness normal bilaterally. No hydronephrosis evident on the right. 5. Size discrepancy between kidneys. Significance of this finding uncertain. This finding potentially may indicate a degree of renal artery stenosis on the left. In this regard, question whether patient is hypertensive. 6.  Small cysts in right kidney lower pole region. 5.  Small cysts in right kidney. 5. Electronically Signed   By: Bretta BangWilliam  Woodruff III M.D.   On: 10/03/2017 11:27    Scheduled Meds: . cholecalciferol  2,000 Units Oral Daily  . clonazePAM  1 mg Oral QHS  . docusate sodium  100 mg Oral BID  . enoxaparin (LOVENOX) injection  40 mg Subcutaneous Q24H  . finasteride  5 mg Oral Daily  . fluticasone  2 spray Each Nare Daily  . lactulose  30 g Oral BID  . lithium carbonate  450 mg Oral Q12H  . loratadine  10 mg Oral Daily  . mirtazapine  30 mg Oral QHS  . polyethylene glycol  17 g Oral Daily  . potassium chloride SA  40 mEq Oral Daily  . risperiDONE  1 mg Oral QHS  . tamsulosin  0.4 mg Oral Daily  . traZODone  200 mg Oral QHS  . vitamin B-12  1,000 mcg Oral Daily   Continuous Infusions: . cefTRIAXone (ROCEPHIN)  IV Stopped (10/03/17 2130)    Principal Problem:   UTI (urinary tract infection) Active Problems:   ARF (acute renal failure) (HCC)   Hyponatremia   Anemia  Time spent:   Standley Dakins, MD, FAAFP [facility] Pager [phone]  If 7PM-7AM, please contact night-coverage [email or web address] Password [email or web address] 10/04/2017, 11:28 AM    LOS: 2 days

## 2017-10-04 NOTE — Care Management (Signed)
CM consulted with no comments. Patient is from [facility]. CSW aware and will facilitate return.

## 2017-10-05 LAB — BASIC METABOLIC PANEL
ANION GAP: 7 (ref 5–15)
BUN: 13 mg/dL (ref 6–20)
CHLORIDE: 104 mmol/L (ref 101–111)
CO2: 26 mmol/L (ref 22–32)
CREATININE: 0.98 mg/dL (ref 0.61–1.24)
Calcium: 8.5 mg/dL — ABNORMAL LOW (ref 8.9–10.3)
GFR calc Af Amer: >60 mL/min (ref >60)
GFR calc non Af Amer: >60 mL/min (ref >60)
Glucose, Bld: 91 mg/dL (ref 65–99)
Potassium: 4.2 mmol/L (ref 3.5–5.1)
SODIUM: 137 mmol/L (ref 135–145)

## 2017-10-05 LAB — CBC
HCT: 32.1 % — ABNORMAL LOW (ref 39.0–52.0)
HEMOGLOBIN: 10.5 g/dL — AB (ref 13.0–17.0)
MCH: 33.4 pg (ref 26.0–34.0)
MCHC: 32.7 g/dL (ref 30.0–36.0)
MCV: 102.2 fL — ABNORMAL HIGH (ref 78.0–100.0)
Platelets: 421 10*3/uL — ABNORMAL HIGH (ref 150–400)
RBC: 3.14 MIL/uL — AB (ref 4.22–5.81)
RDW: 15.1 % (ref 11.5–15.5)
WBC: 11.9 10*3/uL — ABNORMAL HIGH (ref 4.0–10.5)

## 2017-10-05 LAB — URINE CULTURE: Special Requests: NORMAL

## 2017-10-05 MED ORDER — HYDROCHLOROTHIAZIDE 25 MG PO TABS: 12.5000 mg | ORAL_TABLET | Freq: Every day | ORAL | Status: DC

## 2017-10-05 NOTE — [place] FL2 (Signed)
[place] MEDICAID FL2 LEVEL OF CARE SCREENING TOOL     IDENTIFICATION  Patient Name: Ray Jackson Birthdate: [birth year] Sex: male Admission Date (Current Location): 10/02/2017  [place]  [place] [id number] Facility and Address:  [facility],  [address], [place] [id number]      Provider Number: [id number]  Attending Physician Name and Address:  Cleora FleetJohnson, Clanford L, MD  Relative Name and Phone Number:  [name] [phone]    Current Level of Care: Hospital Recommended Level of Care: Other (Comment)(Family Care Home) Prior Approval Number:    Date Approved/Denied:   PASRR Number:    Discharge Plan: Other (Comment)(Family Care Home)    Current Diagnoses: Patient Active Problem List   Diagnosis Date Noted  . UTI (urinary tract infection) 10/02/2017  . ARF (acute renal failure) (HCC) 10/02/2017  . Hyponatremia 10/02/2017  . Anemia 10/02/2017    Orientation RESPIRATION BLADDER Height & Weight     Self  Normal Continent Weight: 130 lb 8.2 oz (59.2 kg) Height:  6' (182.9 cm)  BEHAVIORAL SYMPTOMS/MOOD NEUROLOGICAL BOWEL NUTRITION STATUS      Continent Diet(heart healthy)  AMBULATORY STATUS COMMUNICATION OF NEEDS Skin     Verbally Normal                       Personal Care Assistance Level of Assistance  Bathing, Feeding, Dressing Bathing Assistance: Independent Feeding assistance: Independent Dressing Assistance: Independent     Functional Limitations Info  Sight, Hearing, Speech Sight Info: Adequate Hearing Info: Adequate Speech Info: Adequate    SPECIAL CARE FACTORS FREQUENCY                       Contractures Contractures Info: Not present    Additional Factors Info  Psychotropic Code Status Info: full   Psychotropic Info: Klonopin, Remeron, Risperdol         Current Medications (10/05/2017):  This is the current hospital active medication list  TAKE these medications   cefpodoxime 200 MG  tablet Commonly known as:  VANTIN Take 1 tablet (200 mg total) by mouth 2 (two) times daily for 12 days.   cetirizine 10 MG tablet Commonly known as:  ZYRTEC Take 10 mg by mouth at bedtime.   clonazePAM 1 MG tablet Commonly known as:  KLONOPIN Take 1 tablet by mouth at bedtime.   docusate sodium 100 MG capsule Commonly known as:  COLACE Take 100 mg by mouth 2 (two) times daily.   finasteride 5 MG tablet Commonly known as:  PROSCAR Take 5 mg by mouth daily.   fluticasone 50 MCG/ACT nasal spray Commonly known as:  FLONASE Place 2 sprays into both nostrils daily.   hydrochlorothiazide 25 MG tablet Commonly known as:  HYDRODIURIL Take 0.5 tablets (12.5 mg total) by mouth daily. What changed:  how much to take   INVEGA SUSTENNA 156 MG/ML Susp injection Generic drug:  paliperidone Inject 156 mg into the muscle every 30 (thirty) days.   lactulose 10 GM/15ML solution Commonly known as:  CHRONULAC Take 20 g by mouth 2 (two) times daily.   lithium carbonate 450 MG CR tablet Commonly known as:  ESKALITH Take 1 tablet by mouth every 12 (twelve) hours.   mirtazapine 30 MG tablet Commonly known as:  REMERON Take 30 mg by mouth at bedtime.   polyethylene glycol packet Commonly known as:  MIRALAX / GLYCOLAX Take 17 g by mouth daily.   risperiDONE 1 MG  disintegrating tablet Commonly known as:  RISPERDAL M-TABS Take 1 tablet by mouth at bedtime.   tamsulosin 0.4 MG Caps capsule Commonly known as:  FLOMAX Take 1 capsule by mouth daily.   traZODone 100 MG tablet Commonly known as:  DESYREL Take 200 mg by mouth at bedtime.   vitamin B-12 1000 MCG tablet Commonly known as:  CYANOCOBALAMIN Take 1,000 mcg by mouth daily.   Vitamin D3 2000 units capsule Take 2,000 Units by mouth daily.          Discharge Medications: Please see discharge summary for a list of discharge medications.  Relevant Imaging Results:  Relevant Lab  Results:   Additional Information SS# [id number]  Arvin Collard, [place]

## 2017-10-05 NOTE — Progress Notes (Signed)
Patient will Discharge To: [facility]  Anticipated DC Date:  10/05/17 Family Notified: Olene CravenYes, [name] Witcher, [phone] Transport By: [facility]   Per MD patient ready for [facility] to [facility]  . RN, patient, patient's family, and facility notified of DC. Assessment, Fl2/Pasrr, and Discharge Summary sent to facility. RN given number for report [phone]). DC packet on chart. Ambulance transport requested for patient.   CSW signing off.  Budd Palmerara Javayah Magaw LCSWA [phone]

## 2017-10-05 NOTE — Progress Notes (Signed)
Removed IV-clean, dry, intact. Manager of group home ([facility]) came to pick up pt. I gave manager d/c paperwork but she stated that she needed an FL2 so CSW, Budd Palmerara Oliver, prepared that and had doctor Laural BenesJohnson sign it. I gave report to the manager. Sister, Waynetta SandyBeth, was called earlier to let her know that her brother was being discharged. She told me that she was out of town and to call [facility] and they would set up transportation.

## 2017-10-05 NOTE — Discharge Instructions (Signed)
The pharmacy has substituted the cefpodoxime tablets for cefdinir due to lower cost and insurance coverage which is fine for him to take for the remaining 12 day course of antibiotics.    Follow with Primary MD  Anselm JunglingHatchett, Mary, NP  and other consultant's as instructed your Hospitalist MD  Please get a complete blood count and chemistry panel checked by your Primary MD at your next visit, and again as instructed by your Primary MD.  Get Medicines reviewed and adjusted: Please take all your medications with you for your next visit with your Primary MD  Laboratory/radiological data: Please request your Primary MD to go over all hospital tests and procedure/radiological results at the follow up, please ask your Primary MD to get all Hospital records sent to his/her office.  In some cases, they will be blood work, cultures and biopsy results pending at the time of your discharge. Please request that your primary care M.D. follows up on these results.  Also Note the following: If you experience worsening of your admission symptoms, develop shortness of breath, life threatening emergency, suicidal or homicidal thoughts you must seek medical attention immediately by calling 911 or calling your MD immediately  if symptoms less severe.  You must read complete instructions/literature along with all the possible adverse reactions/side effects for all the Medicines you take and that have been prescribed to you. Take any new Medicines after you have completely understood and accpet all the possible adverse reactions/side effects.   Do not drive when taking Pain medications or sleeping medications (Benzodaizepines)  Do not take more than prescribed Pain, Sleep and Anxiety Medications. It is not advisable to combine anxiety,sleep and pain medications without talking with your primary care practitioner  Special Instructions: If you have smoked or chewed Tobacco  in the last 2 yrs please stop smoking, stop any  regular Alcohol  and or any Recreational drug use.  Wear Seat belts while driving.  Please note: You were cared for by a hospitalist during your hospital stay. Once you are discharged, your primary care physician will handle any further medical issues. Please note that NO REFILLS for any discharge medications will be authorized once you are discharged, as it is imperative that you return to your primary care physician (or establish a relationship with a primary care physician if you do not have one) for your post hospital discharge needs so that they can reassess your need for medications and monitor your lab values.

## 2017-10-05 NOTE — Discharge Summary (Addendum)
Physician Discharge Summary  Ray Jackson ZOX:[id number] DOB: [birth year] DOA: 10/02/2017  PCP: Anselm Jungling, NP  Admit date: 10/02/2017 Discharge date: 10/05/2017  Admitted From: Group home Disposition: Same Recommendations for Outpatient Follow-up:  1. Follow up with PCP in 1 weeks 2. Please obtain BMP/CBC in one week 3. Please give patient his monthly Invega injection as soon as possible (was due on 10/03/17) 4. Please follow up on the following pending results: final culture data  The pharmacy has substituted the cefpodoxime tablets for cefdinir due to lower cost and insurance coverage which is fine for him to take for the remaining 12 day course of antibiotics.  Discharge Condition: STABLE   CODE STATUS: FULL    Brief Hospitalization Summary: Please see all hospital notes, images, labs for full details of the hospitalization. Brief Admission Hx:  Ray Jackson a62 y.o.male,wHypertension, Schizophrenia, who presents with decrease in eating and drinking and is sent to ER from group home for evaluation.  He was noted to be dehydrated and positive for a urinary tract infection.  MDM/Assessment & Plan:   1. Cystitis and Pyelonephritis -treated with IV ceftriaxone, supportive care and hydrated with IV fluids, renal ultrasound reports signs of pyelonephritis.  2. Hyponatremia-resolved, secondary to dehydration, hydrate with normal saline and holding hydrochlorothiazide.  Resume HCTZ at 0.5 tablet daily (12.5 mg).   3. Anemia, unspecified- follow CBC closely, some hemodilution effect seen. 4. Schizophrenia-resume home medications lithium, Invega, Risperdal. 5. Generalized anxiety disorder-continue clonazepam as ordered. 6. Hypokalemia - Repleted.    7. Leukocytosis - WBC trending down, secondary to UTI, following CBC/diff.  8. Acute renal failure - Improved with IVF hydration.    DVT prophylaxis: SCDs and Lovenox Code Status: Full code Family Communication: None  present Disposition Plan: Return to group home when medically stable, hopefully over weekend.   Discharge Diagnoses:  Principal Problem:   UTI (urinary tract infection) Active Problems:   ARF (acute renal failure) (HCC)   Hyponatremia   Anemia  Discharge Instructions: Discharge Instructions    Increase activity slowly   Complete by:  As directed      Allergies as of 10/05/2017   No Known Allergies     Medication List    TAKE these medications   cefpodoxime 200 MG tablet Commonly known as:  VANTIN Take 1 tablet (200 mg total) by mouth 2 (two) times daily for 12 days.   cetirizine 10 MG tablet Commonly known as:  ZYRTEC Take 10 mg by mouth at bedtime.   clonazePAM 1 MG tablet Commonly known as:  KLONOPIN Take 1 tablet by mouth at bedtime.   docusate sodium 100 MG capsule Commonly known as:  COLACE Take 100 mg by mouth 2 (two) times daily.   finasteride 5 MG tablet Commonly known as:  PROSCAR Take 5 mg by mouth daily.   fluticasone 50 MCG/ACT nasal spray Commonly known as:  FLONASE Place 2 sprays into both nostrils daily.   hydrochlorothiazide 25 MG tablet Commonly known as:  HYDRODIURIL Take 0.5 tablets (12.5 mg total) by mouth daily. What changed:  how much to take   INVEGA SUSTENNA 156 MG/ML Susp injection Generic drug:  paliperidone Inject 156 mg into the muscle every 30 (thirty) days.   lactulose 10 GM/15ML solution Commonly known as:  CHRONULAC Take 20 g by mouth 2 (two) times daily.   lithium carbonate 450 MG CR tablet Commonly known as:  ESKALITH Take 1 tablet by mouth every 12 (twelve) hours.   mirtazapine 30 MG tablet Commonly known  as:  REMERON Take 30 mg by mouth at bedtime.   polyethylene glycol packet Commonly known as:  MIRALAX / GLYCOLAX Take 17 g by mouth daily.   risperiDONE 1 MG disintegrating tablet Commonly known as:  RISPERDAL M-TABS Take 1 tablet by mouth at bedtime.   tamsulosin 0.4 MG Caps capsule Commonly known as:   FLOMAX Take 1 capsule by mouth daily.   traZODone 100 MG tablet Commonly known as:  DESYREL Take 200 mg by mouth at bedtime.   vitamin B-12 1000 MCG tablet Commonly known as:  CYANOCOBALAMIN Take 1,000 mcg by mouth daily.   Vitamin D3 2000 units capsule Take 2,000 Units by mouth daily.       No Known Allergies Allergies as of 10/05/2017   No Known Allergies     Medication List    TAKE these medications   cefpodoxime 200 MG tablet Commonly known as:  VANTIN Take 1 tablet (200 mg total) by mouth 2 (two) times daily for 12 days.   cetirizine 10 MG tablet Commonly known as:  ZYRTEC Take 10 mg by mouth at bedtime.   clonazePAM 1 MG tablet Commonly known as:  KLONOPIN Take 1 tablet by mouth at bedtime.   docusate sodium 100 MG capsule Commonly known as:  COLACE Take 100 mg by mouth 2 (two) times daily.   finasteride 5 MG tablet Commonly known as:  PROSCAR Take 5 mg by mouth daily.   fluticasone 50 MCG/ACT nasal spray Commonly known as:  FLONASE Place 2 sprays into both nostrils daily.   hydrochlorothiazide 25 MG tablet Commonly known as:  HYDRODIURIL Take 0.5 tablets (12.5 mg total) by mouth daily. What changed:  how much to take   INVEGA SUSTENNA 156 MG/ML Susp injection Generic drug:  paliperidone Inject 156 mg into the muscle every 30 (thirty) days.   lactulose 10 GM/15ML solution Commonly known as:  CHRONULAC Take 20 g by mouth 2 (two) times daily.   lithium carbonate 450 MG CR tablet Commonly known as:  ESKALITH Take 1 tablet by mouth every 12 (twelve) hours.   mirtazapine 30 MG tablet Commonly known as:  REMERON Take 30 mg by mouth at bedtime.   polyethylene glycol packet Commonly known as:  MIRALAX / GLYCOLAX Take 17 g by mouth daily.   risperiDONE 1 MG disintegrating tablet Commonly known as:  RISPERDAL M-TABS Take 1 tablet by mouth at bedtime.   tamsulosin 0.4 MG Caps capsule Commonly known as:  FLOMAX Take 1 capsule by mouth daily.    traZODone 100 MG tablet Commonly known as:  DESYREL Take 200 mg by mouth at bedtime.   vitamin B-12 1000 MCG tablet Commonly known as:  CYANOCOBALAMIN Take 1,000 mcg by mouth daily.   Vitamin D3 2000 units capsule Take 2,000 Units by mouth daily.       Procedures/Studies: [facility]  Result Date: 10/03/2017 CLINICAL DATA:  Acute renal failure with urinary tract infection EXAM: RENAL / URINARY TRACT ULTRASOUND COMPLETE COMPARISON:  None. FINDINGS: Right Kidney: Length: 12.0 cm. Renal echogenicity overall is somewhat increased. There is inhomogeneous echogenicity in the upper pole the right kidney without a well-defined mass in this region. There is slight fullness of the right renal collecting system. There is a cyst in the lower pole of the right kidney measuring 1.7 x 1.7 x 1.6 cm. There is a nearby cyst in the lower pole region on the right measuring 0.5 x 0.5 x 0.6 cm. No perinephric fluid evident. No ureterectasis or well-defined  sonographically demonstrable calculus noted. Left Kidney: Length: 8.2 cm. Echogenicity is increased. Renal cortical thickness normal. No mass, perinephric fluid, or hydronephrosis visualized. No sonographically demonstrable calculus or ureterectasis. Bladder: Urinary bladder appears distended with apparent debris within the urinary bladder. By report, there is a Foley catheter in the bladder. IMPRESSION: 1. There is relative increased echogenicity in a portion of the upper pole of the right kidney without well-defined mass. This appearance raises concern for acute lobar nephronia/bacterial nephritis. No frank abscess. No surrounding pericholecystic fluid. 2. There is slight fullness of the right renal collecting system, a finding that may be associated with pyelonephritis/acute lobar nephronia. 3. Apparent debris in urinary bladder. Suspect cystitis. There is significant amount of urine in the bladder despite reported presence of Foley catheter. Question nonfunctioning  or possible malposition of Foley catheter. 4. Both kidneys are echogenic, a finding that is felt to be indicative of underlying medical renal disease. Renal cortical thickness normal bilaterally. No hydronephrosis evident on the right. 5. Size discrepancy between kidneys. Significance of this finding uncertain. This finding potentially may indicate a degree of renal artery stenosis on the left. In this regard, question whether patient is hypertensive. 6.  Small cysts in right kidney lower pole region. 5.  Small cysts in right kidney. 5. Electronically Signed   By: Bretta Bang III M.D.   On: 10/03/2017 11:27     Subjective: The patient reports that he feels much better today.  He is eating and drinking well.  He is urinating well.  He would like to discharge back to his group home today.  He has no fever.  Discharge Exam: Vitals:   10/04/17 2108 10/05/17 0442  BP: 136/82 138/70  Pulse: 74 63  Resp: 18 18  Temp: 98.2 F (36.8 C) 98.6 F (37 C)  SpO2: 93% 96%   Vitals:   10/04/17 0534 10/04/17 1323 10/04/17 2108 10/05/17 0442  BP: 118/64 118/74 136/82 138/70  Pulse: 78 64 74 63  Resp: 16 18 18 18   Temp: 98.2 F (36.8 C) 98.3 F (36.8 C) 98.2 F (36.8 C) 98.6 F (37 C)  TempSrc: Oral Oral Oral Oral  SpO2: 94% 94% 93% 96%  Weight:    59.2 kg (130 lb 8.2 oz)  Height:        General: Pt is alert, awake, not in acute distress Cardiovascular: RRR, S1/S2 +, no rubs, no gallops Respiratory: CTA bilaterally, no wheezing, no rhonchi Abdominal: Soft, NT, ND, bowel sounds + Extremities: no edema, no cyanosis   The results of significant diagnostics from this hospitalization (including imaging, microbiology, ancillary and laboratory) are listed below for reference.     Microbiology: Recent Results (from the past 240 hour(s))  Urine Culture     Status: Abnormal   Collection Time: 10/02/17  6:10 PM  Result Value Ref Range Status   Specimen Description   Final    URINE,  RANDOM Performed at [facility], [address], [place], [place] [address]    Special Requests   Final    Normal Performed at [facility], [address] [place], [place] [address]    Culture 20,000 COLONIES/mL ESCHERICHIA COLI (A)  Final   Report Status 10/05/2017 FINAL  Final   Organism ID, Bacteria ESCHERICHIA COLI (A)  Final      Susceptibility   Escherichia coli - MIC*    AMPICILLIN >=32 RESISTANT Resistant     CEFAZOLIN <=4 SENSITIVE Sensitive     CEFTRIAXONE <=1 SENSITIVE Sensitive  CIPROFLOXACIN <=0.25 SENSITIVE Sensitive     GENTAMICIN <=1 SENSITIVE Sensitive     IMIPENEM <=0.25 SENSITIVE Sensitive     NITROFURANTOIN <=16 SENSITIVE Sensitive     TRIMETH/SULFA <=20 SENSITIVE Sensitive     AMPICILLIN/SULBACTAM 8 SENSITIVE Sensitive     PIP/TAZO <=4 SENSITIVE Sensitive     Extended ESBL NEGATIVE Sensitive     * 20,000 COLONIES/mL ESCHERICHIA COLI  MRSA PCR Screening     Status: None   Collection Time: 10/02/17 10:22 PM  Result Value Ref Range Status   MRSA by PCR NEGATIVE NEGATIVE Final    Comment:        The GeneXpert MRSA Assay (FDA approved for NASAL specimens only), is one component of a comprehensive MRSA colonization surveillance program. It is not intended to diagnose MRSA infection nor to guide or monitor treatment for MRSA infections. Performed at [facility], [address] [place], [place] [address]      Labs: BNP (last 3 results) No results for input(s): BNP in the last 8760 hours. Basic Metabolic Panel: Recent Labs  Lab 10/02/17 1629 10/03/17 0355 10/04/17 0636 10/05/17 0640  NA 126* 135 137 137  K 3.5 3.4* 3.9 4.2  CL 90* 101 105 104  CO2 26 25 26 26   GLUCOSE 158* 100* 100* 91  BUN 21* 22* 17 13  CREATININE 1.58* 1.16 1.01 0.98  CALCIUM 8.4* 8.6* 8.5* 8.5*  MG  --  1.9 1.9  --    Liver Function Tests: Recent Labs  Lab 10/02/17 1629 10/03/17 0355  AST 13* 16  ALT 10* 13*  ALKPHOS 53 53  BILITOT 0.5 0.4  PROT 6.2* 5.7*   ALBUMIN 2.4* 2.2*   No results for input(s): LIPASE, AMYLASE in the last 168 hours. No results for input(s): AMMONIA in the last 168 hours. CBC: Recent Labs  Lab 10/02/17 1629 10/03/17 0355 10/04/17 0636 10/05/17 0640  WBC 16.5* 17.5* 13.0* 11.9*  NEUTROABS 13.3*  --  9.3*  --   HGB 10.8* 10.4* 9.8* 10.5*  HCT 34.3* 32.4* 31.6* 32.1*  MCV 101.5* 100.9* 102.3* 102.2*  PLT 427* 405* 375 421*   Cardiac Enzymes: No results for input(s): CKTOTAL, CKMB, CKMBINDEX, TROPONINI in the last 168 hours. BNP: Invalid input(s): POCBNP CBG: No results for input(s): GLUCAP in the last 168 hours. D-Dimer No results for input(s): DDIMER in the last 72 hours. Hgb A1c No results for input(s): HGBA1C in the last 72 hours. Lipid Profile No results for input(s): CHOL, HDL, LDLCALC, TRIG, CHOLHDL, LDLDIRECT in the last 72 hours. Thyroid function studies Recent Labs    10/02/17 2207  TSH 2.818   Anemia work up No results for input(s): VITAMINB12, FOLATE, FERRITIN, TIBC, IRON, RETICCTPCT in the last 72 hours. Urinalysis    Component Value Date/Time   COLORURINE YELLOW 10/02/2017 1732   APPEARANCEUR CLOUDY (A) 10/02/2017 1732   APPEARANCEUR Clear 04/01/2014 0703   LABSPEC 1.006 10/02/2017 1732   LABSPEC 1.006 04/01/2014 0703   PHURINE 6.0 10/02/2017 1732   GLUCOSEU NEGATIVE 10/02/2017 1732   GLUCOSEU Negative 04/01/2014 0703   HGBUR SMALL (A) 10/02/2017 1732   BILIRUBINUR NEGATIVE 10/02/2017 1732   BILIRUBINUR Negative 04/01/2014 0703   KETONESUR NEGATIVE 10/02/2017 1732   PROTEINUR 30 (A) 10/02/2017 1732   NITRITE NEGATIVE 10/02/2017 1732   LEUKOCYTESUR LARGE (A) 10/02/2017 1732   LEUKOCYTESUR Negative 04/01/2014 0703   Sepsis Labs Invalid input(s): PROCALCITONIN,  WBC,  LACTICIDVEN Microbiology Recent Results (from the past 240 hour(s))  Urine Culture  Status: Abnormal   Collection Time: 10/02/17  6:10 PM  Result Value Ref Range Status   Specimen Description   Final     URINE, RANDOM Performed at [facility], [address] [place], [place] [address]    Special Requests   Final    Normal Performed at [facility], [address], [place], [place] [address]    Culture 20,000 COLONIES/mL ESCHERICHIA COLI (A)  Final   Report Status 10/05/2017 FINAL  Final   Organism ID, Bacteria ESCHERICHIA COLI (A)  Final      Susceptibility   Escherichia coli - MIC*    AMPICILLIN >=32 RESISTANT Resistant     CEFAZOLIN <=4 SENSITIVE Sensitive     CEFTRIAXONE <=1 SENSITIVE Sensitive     CIPROFLOXACIN <=0.25 SENSITIVE Sensitive     GENTAMICIN <=1 SENSITIVE Sensitive     IMIPENEM <=0.25 SENSITIVE Sensitive     NITROFURANTOIN <=16 SENSITIVE Sensitive     TRIMETH/SULFA <=20 SENSITIVE Sensitive     AMPICILLIN/SULBACTAM 8 SENSITIVE Sensitive     PIP/TAZO <=4 SENSITIVE Sensitive     Extended ESBL NEGATIVE Sensitive     * 20,000 COLONIES/mL ESCHERICHIA COLI  MRSA PCR Screening     Status: None   Collection Time: 10/02/17 10:22 PM  Result Value Ref Range Status   MRSA by PCR NEGATIVE NEGATIVE Final    Comment:        The GeneXpert MRSA Assay (FDA approved for NASAL specimens only), is one component of a comprehensive MRSA colonization surveillance program. It is not intended to diagnose MRSA infection nor to guide or monitor treatment for MRSA infections. Performed at [facility], [address], [place], [place] [address]    Time coordinating discharge: 33 mins  SIGNED:  Standley Dakins, MD  [facility] 10/05/2017, 10:07 AM Pager [phone]  If 7PM-7AM, please contact night-coverage [email or web address] Password [email or web address]

## 2017-11-05 ENCOUNTER — Emergency Department: Payer: Medicaid Other

## 2017-11-05 ENCOUNTER — Emergency Department
Admission: EM | Admit: 2017-11-05 | Discharge: 2017-11-05 | Disposition: A | Discharge status: Other Acute Inpatient Hospital | Payer: Medicaid Other | Attending: Emergency Medicine | Admitting: Emergency Medicine

## 2017-11-05 ENCOUNTER — Other Ambulatory Visit: Payer: Self-pay

## 2017-11-05 ENCOUNTER — Inpatient Hospital Stay
Admission: AD | Admit: 2017-11-05 | Discharge: 2017-11-15 | DRG: 885 | Disposition: A | Discharge status: Home / Self Care | Payer: Medicaid Other | Source: Intra-hospital | Attending: Psychiatry | Admitting: Psychiatry

## 2017-11-05 DIAGNOSIS — E46 Unspecified protein-calorie malnutrition: Secondary | ICD-10-CM | POA: Diagnosis present

## 2017-11-05 DIAGNOSIS — Z9119 Patient's noncompliance with other medical treatment and regimen: Secondary | ICD-10-CM

## 2017-11-05 DIAGNOSIS — N3000 Acute cystitis without hematuria: Secondary | ICD-10-CM | POA: Insufficient documentation

## 2017-11-05 DIAGNOSIS — F259 Schizoaffective disorder, unspecified: Principal | ICD-10-CM | POA: Diagnosis present

## 2017-11-05 DIAGNOSIS — I10 Essential (primary) hypertension: Secondary | ICD-10-CM | POA: Diagnosis present

## 2017-11-05 DIAGNOSIS — Z79899 Other long term (current) drug therapy: Secondary | ICD-10-CM | POA: Diagnosis not present

## 2017-11-05 DIAGNOSIS — F209 Schizophrenia, unspecified: Secondary | ICD-10-CM | POA: Diagnosis present

## 2017-11-05 DIAGNOSIS — R0602 Shortness of breath: Secondary | ICD-10-CM | POA: Diagnosis not present

## 2017-11-05 DIAGNOSIS — N390 Urinary tract infection, site not specified: Secondary | ICD-10-CM | POA: Diagnosis present

## 2017-11-05 DIAGNOSIS — Z046 Encounter for general psychiatric examination, requested by authority: Secondary | ICD-10-CM | POA: Insufficient documentation

## 2017-11-05 DIAGNOSIS — Z681 Body mass index (BMI) 19 or less, adult: Secondary | ICD-10-CM

## 2017-11-05 DIAGNOSIS — F203 Undifferentiated schizophrenia: Secondary | ICD-10-CM

## 2017-11-05 DIAGNOSIS — F919 Conduct disorder, unspecified: Secondary | ICD-10-CM | POA: Diagnosis present

## 2017-11-05 DIAGNOSIS — F17200 Nicotine dependence, unspecified, uncomplicated: Secondary | ICD-10-CM | POA: Insufficient documentation

## 2017-11-05 DIAGNOSIS — Z91199 Patient's noncompliance with other medical treatment and regimen due to unspecified reason: Secondary | ICD-10-CM

## 2017-11-05 DIAGNOSIS — F329 Major depressive disorder, single episode, unspecified: Secondary | ICD-10-CM | POA: Diagnosis present

## 2017-11-05 DIAGNOSIS — G3184 Mild cognitive impairment, so stated: Secondary | ICD-10-CM | POA: Diagnosis present

## 2017-11-05 HISTORY — DX: Patient's noncompliance with other medical treatment and regimen: Z91.19

## 2017-11-05 HISTORY — DX: Acute kidney failure, unspecified: N17.9

## 2017-11-05 HISTORY — DX: Hypo-osmolality and hyponatremia: E87.1

## 2017-11-05 HISTORY — DX: Patient's noncompliance with other medical treatment and regimen due to unspecified reason: Z91.199

## 2017-11-05 LAB — URINE DRUG SCREEN, QUALITATIVE (ARMC ONLY)
AMPHETAMINES, UR SCREEN: NOT DETECTED
BENZODIAZEPINE, UR SCRN: NOT DETECTED
Barbiturates, Ur Screen: NOT DETECTED
Cannabinoid 50 Ng, Ur ~~LOC~~: NOT DETECTED
Cocaine Metabolite,Ur ~~LOC~~: NOT DETECTED
MDMA (Ecstasy)Ur Screen: NOT DETECTED
METHADONE SCREEN, URINE: NOT DETECTED
Opiate, Ur Screen: NOT DETECTED
PHENCYCLIDINE (PCP) UR S: NOT DETECTED
Tricyclic, Ur Screen: NOT DETECTED

## 2017-11-05 LAB — CBC
HCT: 37.5 % — ABNORMAL LOW (ref 40.0–52.0)
Hemoglobin: 12.4 g/dL — ABNORMAL LOW (ref 13.0–18.0)
MCH: 33 pg (ref 26.0–34.0)
MCHC: 33.1 g/dL (ref 32.0–36.0)
MCV: 99.7 fL (ref 80.0–100.0)
Platelets: 421 10*3/uL (ref 150–440)
RBC: 3.76 MIL/uL — ABNORMAL LOW (ref 4.40–5.90)
RDW: 16 % — AB (ref 11.5–14.5)
WBC: 13.8 10*3/uL — AB (ref 3.8–10.6)

## 2017-11-05 LAB — URINALYSIS, COMPLETE (UACMP) WITH MICROSCOPIC
BILIRUBIN URINE: NEGATIVE
Bacteria, UA: NONE SEEN
Glucose, UA: NEGATIVE mg/dL
KETONES UR: NEGATIVE mg/dL
Nitrite: NEGATIVE
PH: 7 (ref 5.0–8.0)
Protein, ur: 30 mg/dL — AB
SPECIFIC GRAVITY, URINE: 1.008 (ref 1.005–1.030)
SQUAMOUS EPITHELIAL / LPF: NONE SEEN

## 2017-11-05 LAB — COMPREHENSIVE METABOLIC PANEL
ALT: 13 U/L — ABNORMAL LOW (ref 17–63)
ANION GAP: 8 (ref 5–15)
AST: 18 U/L (ref 15–41)
Albumin: 3 g/dL — ABNORMAL LOW (ref 3.5–5.0)
Alkaline Phosphatase: 56 U/L (ref 38–126)
BILIRUBIN TOTAL: 0.4 mg/dL (ref 0.3–1.2)
BUN: 16 mg/dL (ref 6–20)
CO2: 30 mmol/L (ref 22–32)
Calcium: 8.9 mg/dL (ref 8.9–10.3)
Chloride: 99 mmol/L — ABNORMAL LOW (ref 101–111)
Creatinine, Ser: 1.11 mg/dL (ref 0.61–1.24)
GFR calc Af Amer: >60 mL/min (ref >60)
Glucose, Bld: 110 mg/dL — ABNORMAL HIGH (ref 65–99)
POTASSIUM: 3.6 mmol/L (ref 3.5–5.1)
Sodium: 137 mmol/L (ref 135–145)
TOTAL PROTEIN: 6.5 g/dL (ref 6.5–8.1)

## 2017-11-05 LAB — ACETAMINOPHEN LEVEL

## 2017-11-05 LAB — LITHIUM LEVEL: LITHIUM LVL: 0.67 mmol/L (ref 0.60–1.20)

## 2017-11-05 LAB — SALICYLATE LEVEL: Salicylate Lvl: <7 mg/dL (ref 2.8–30.0)

## 2017-11-05 LAB — ETHANOL

## 2017-11-05 MED ORDER — HYDROCHLOROTHIAZIDE 12.5 MG PO CAPS
ORAL_CAPSULE | ORAL | Status: AC
  Administered 2017-11-05: 12.5 mg via ORAL
  Filled 2017-11-05: qty 1

## 2017-11-05 MED ORDER — TAMSULOSIN HCL 0.4 MG PO CAPS
0.4000 mg | ORAL_CAPSULE | Freq: Every day | ORAL | Status: DC
  Administered 2017-11-06 – 2017-11-15 (×10): 0.4 mg via ORAL
  Filled 2017-11-05 (×10): qty 1

## 2017-11-05 MED ORDER — TAMSULOSIN HCL 0.4 MG PO CAPS
0.4000 mg | ORAL_CAPSULE | Freq: Every day | ORAL | Status: DC
  Administered 2017-11-05: 0.4 mg via ORAL

## 2017-11-05 MED ORDER — HYDROCHLOROTHIAZIDE 12.5 MG PO CAPS
12.5000 mg | ORAL_CAPSULE | Freq: Every day | ORAL | Status: DC
  Administered 2017-11-05: 12.5 mg via ORAL

## 2017-11-05 MED ORDER — FINASTERIDE 5 MG PO TABS
5.0000 mg | ORAL_TABLET | Freq: Every day | ORAL | Status: DC
  Administered 2017-11-05: 5 mg via ORAL
  Filled 2017-11-05: qty 1

## 2017-11-05 MED ORDER — DOCUSATE SODIUM 100 MG PO CAPS: 100.0000 mg | ORAL_CAPSULE | Freq: Two times a day (BID) | ORAL | Status: DC

## 2017-11-05 MED ORDER — CLONAZEPAM 0.5 MG PO TABS: 1.0000 mg | ORAL_TABLET | Freq: Every day | ORAL | Status: DC

## 2017-11-05 MED ORDER — RISPERIDONE 1 MG PO TBDP
1.0000 mg | ORAL_TABLET | Freq: Every day | ORAL | Status: DC
  Administered 2017-11-05 – 2017-11-14 (×10): 1 mg via ORAL
  Filled 2017-11-05 (×12): qty 1

## 2017-11-05 MED ORDER — MIRTAZAPINE 15 MG PO TABS: 30.0000 mg | ORAL_TABLET | Freq: Every day | ORAL | Status: DC

## 2017-11-05 MED ORDER — FLUTICASONE PROPIONATE 50 MCG/ACT NA SUSP
2.0000 | Freq: Every day | NASAL | Status: DC
  Administered 2017-11-05: 2 via NASAL
  Filled 2017-11-05: qty 16

## 2017-11-05 MED ORDER — LACTULOSE 10 GM/15ML PO SOLN
20.0000 g | Freq: Two times a day (BID) | ORAL | Status: DC
  Administered 2017-11-05 – 2017-11-14 (×16): 20 g via ORAL
  Filled 2017-11-05 (×18): qty 30

## 2017-11-05 MED ORDER — DOCUSATE SODIUM 100 MG PO CAPS
100.0000 mg | ORAL_CAPSULE | Freq: Two times a day (BID) | ORAL | Status: DC
  Administered 2017-11-05 – 2017-11-15 (×19): 100 mg via ORAL
  Filled 2017-11-05 (×20): qty 1

## 2017-11-05 MED ORDER — VITAMIN B-12 1000 MCG PO TABS
1000.0000 ug | ORAL_TABLET | Freq: Every day | ORAL | Status: DC
  Administered 2017-11-05: 1000 ug via ORAL
  Filled 2017-11-05: qty 1

## 2017-11-05 MED ORDER — VITAMIN D 1000 UNITS PO TABS
1000.0000 [IU] | ORAL_TABLET | Freq: Every day | ORAL | Status: DC
  Administered 2017-11-05: 1000 [IU] via ORAL
  Filled 2017-11-05: qty 1

## 2017-11-05 MED ORDER — POLYETHYLENE GLYCOL 3350 17 G PO PACK
17.0000 g | PACK | Freq: Every day | ORAL | Status: DC
  Administered 2017-11-06 – 2017-11-14 (×7): 17 g via ORAL
  Filled 2017-11-05 (×10): qty 1

## 2017-11-05 MED ORDER — LACTULOSE 10 GM/15ML PO SOLN: 20.0000 g | Freq: Two times a day (BID) | ORAL | Status: DC

## 2017-11-05 MED ORDER — LITHIUM CARBONATE ER 450 MG PO TBCR
450.0000 mg | EXTENDED_RELEASE_TABLET | Freq: Two times a day (BID) | ORAL | Status: DC
  Administered 2017-11-05 – 2017-11-15 (×20): 450 mg via ORAL
  Filled 2017-11-05 (×21): qty 1

## 2017-11-05 MED ORDER — VITAMIN D 1000 UNITS PO TABS
1000.0000 [IU] | ORAL_TABLET | Freq: Every day | ORAL | Status: DC
  Administered 2017-11-06 – 2017-11-15 (×10): 1000 [IU] via ORAL
  Filled 2017-11-05 (×10): qty 1

## 2017-11-05 MED ORDER — CEPHALEXIN 500 MG PO CAPS
500.0000 mg | ORAL_CAPSULE | Freq: Two times a day (BID) | ORAL | Status: DC
  Administered 2017-11-05: 500 mg via ORAL
  Filled 2017-11-05: qty 1

## 2017-11-05 MED ORDER — RISPERIDONE 0.5 MG PO TBDP: 1.0000 mg | ORAL_TABLET | Freq: Every day | ORAL | Status: DC

## 2017-11-05 MED ORDER — TAMSULOSIN HCL 0.4 MG PO CAPS
ORAL_CAPSULE | ORAL | Status: AC
  Administered 2017-11-05: 0.4 mg via ORAL
  Filled 2017-11-05: qty 1

## 2017-11-05 MED ORDER — LITHIUM CARBONATE ER 450 MG PO TBCR: 450.0000 mg | EXTENDED_RELEASE_TABLET | Freq: Two times a day (BID) | ORAL | Status: DC

## 2017-11-05 MED ORDER — HYDROXYZINE HCL 25 MG PO TABS
25.0000 mg | ORAL_TABLET | Freq: Three times a day (TID) | ORAL | Status: DC | PRN
  Administered 2017-11-07: 25 mg via ORAL
  Filled 2017-11-05: qty 1

## 2017-11-05 MED ORDER — MIRTAZAPINE 15 MG PO TABS
30.0000 mg | ORAL_TABLET | Freq: Every day | ORAL | Status: DC
  Administered 2017-11-05 – 2017-11-14 (×10): 30 mg via ORAL
  Filled 2017-11-05 (×10): qty 2

## 2017-11-05 MED ORDER — POLYETHYLENE GLYCOL 3350 17 G PO PACK
PACK | ORAL | Status: AC
  Administered 2017-11-05: 17 g via ORAL
  Filled 2017-11-05: qty 1

## 2017-11-05 MED ORDER — HYDROCHLOROTHIAZIDE 12.5 MG PO CAPS
12.5000 mg | ORAL_CAPSULE | Freq: Every day | ORAL | Status: DC
  Administered 2017-11-06: 12.5 mg via ORAL
  Filled 2017-11-05: qty 1

## 2017-11-05 MED ORDER — ACETAMINOPHEN 325 MG PO TABS: 650.0000 mg | ORAL_TABLET | Freq: Four times a day (QID) | ORAL | Status: DC | PRN

## 2017-11-05 MED ORDER — MAGNESIUM HYDROXIDE 400 MG/5ML PO SUSP: 30.0000 mL | Freq: Every day | ORAL | Status: DC | PRN

## 2017-11-05 MED ORDER — POLYETHYLENE GLYCOL 3350 17 G PO PACK
17.0000 g | PACK | Freq: Every day | ORAL | Status: DC
  Administered 2017-11-05: 17 g via ORAL

## 2017-11-05 MED ORDER — PALIPERIDONE PALMITATE 156 MG/ML IM SUSP
156.0000 mg | Freq: Once | INTRAMUSCULAR | Status: AC
  Administered 2017-11-05: 156 mg via INTRAMUSCULAR
  Filled 2017-11-05: qty 1

## 2017-11-05 MED ORDER — CLONAZEPAM 1 MG PO TABS
1.0000 mg | ORAL_TABLET | Freq: Every day | ORAL | Status: DC
  Administered 2017-11-05 – 2017-11-14 (×10): 1 mg via ORAL
  Filled 2017-11-05 (×10): qty 1

## 2017-11-05 MED ORDER — TRAZODONE HCL 100 MG PO TABS: 100.0000 mg | ORAL_TABLET | Freq: Every day | ORAL | Status: DC

## 2017-11-05 MED ORDER — FINASTERIDE 5 MG PO TABS
5.0000 mg | ORAL_TABLET | Freq: Every day | ORAL | Status: DC
  Administered 2017-11-06 – 2017-11-15 (×10): 5 mg via ORAL
  Filled 2017-11-05 (×10): qty 1

## 2017-11-05 MED ORDER — ALUM & MAG HYDROXIDE-SIMETH 200-200-20 MG/5ML PO SUSP: 30.0000 mL | ORAL | Status: DC | PRN

## 2017-11-05 MED ORDER — VITAMIN B-12 1000 MCG PO TABS
1000.0000 ug | ORAL_TABLET | Freq: Every day | ORAL | Status: DC
  Administered 2017-11-06 – 2017-11-15 (×10): 1000 ug via ORAL
  Filled 2017-11-05 (×10): qty 1

## 2017-11-05 MED ORDER — FLUTICASONE PROPIONATE 50 MCG/ACT NA SUSP
2.0000 | Freq: Every day | NASAL | Status: DC
  Administered 2017-11-06: 2 via NASAL
  Filled 2017-11-05: qty 16

## 2017-11-05 MED ORDER — TRAZODONE HCL 100 MG PO TABS
100.0000 mg | ORAL_TABLET | Freq: Every day | ORAL | Status: DC
  Administered 2017-11-05 – 2017-11-06 (×2): 100 mg via ORAL
  Filled 2017-11-05 (×2): qty 1

## 2017-11-05 NOTE — ED Notes (Signed)
Report given to Gary, RN 

## 2017-11-05 NOTE — ED Notes (Addendum)
Blue jacket, gray button down, black sweatpants, tan belt, gray shoes, gray/red socks. Navy tshirt- placed in 1 belongings bag. Pt placed into purple scrubs by this RN and Genelle BalBrett EDT.   Pt kept black glasses.

## 2017-11-05 NOTE — ED Notes (Signed)
Called lab to add Lithium level.

## 2017-11-05 NOTE — ED Notes (Signed)
Attempted to call pt legal guardian Advice worker([name] Witcher at [phone]). Left voicemail d/t no answer. Pt states this is his sister.

## 2017-11-05 NOTE — ED Notes (Signed)
IVC  PENDING  GOING  TO  [facility]  MED  TONIGHT 

## 2017-11-05 NOTE — BH Assessment (Signed)
[facility] Attending Physician will be Dr. [name] Patient has been [facility] room [facility] B, [facility]  [place]  ER staff is aware of the admission:  Lisa-ER Sectary   Dr. Marisa SeverinSiadecki, ER MD   Victorino DikeJennifer -Patient's Nurse   Janelle-Patient Access.

## 2017-11-05 NOTE — ED Triage Notes (Addendum)
Pt is a resident at [facility]. Pt states someone at facility told him to take shower, he asked for a towel. He states that she went away and police showed up. Pt states he told them he was having pain in his bladder. PD states that EMS was called and pt refused transport so facility took out IVC papers.   Pt talking very slow. Cooperative. Alert, oriented, ambulatory.   Pt arrives IVC via [place].   Denies pain at current.   Denies SI or HI.

## 2017-11-05 NOTE — Tx Team (Signed)
Initial Treatment Plan 11/05/2017 9:13 PM Ray Jackson [id number]    PATIENT STRESSORS: Financial difficulties Health problems Medication change or noncompliance Occupational concerns   PATIENT STRENGTHS: Ability for insight Capable of independent living Motivation for treatment/growth Supportive family/friends   PATIENT IDENTIFIED PROBLEMS: UTI (confused)    Noncompliance with Tx.     Schizophrenia             DISCHARGE CRITERIA:  Adequate post-discharge living arrangements Improved stabilization in mood, thinking, and/or behavior Medical problems require only outpatient monitoring Motivation to continue treatment in a less acute level of care Need for constant or close observation no longer present Reduction of life-threatening or endangering symptoms to within safe limits  PRELIMINARY DISCHARGE PLAN: Attend aftercare/continuing care group Attend 12-step recovery group Outpatient therapy Return to previous living arrangement  PATIENT/FAMILY INVOLVEMENT: This treatment plan has been presented to and reviewed with the patient, Ray Jackson,   The patient  have been given the opportunity to ask questions and make suggestions.  [name]  [name], RN 11/05/2017, 9:13 PM

## 2017-11-05 NOTE — ED Provider Notes (Addendum)
[facility] Emergency Department Provider Note ____________________________________________   First MD Initiated Contact with Patient 11/05/17 1615     (approximate)  I have reviewed the triage vital signs and the nursing notes.   HISTORY  Chief Complaint Medical Clearance and Urinary Frequency  Level 5 caveat: History of present illness limited due to unreliable historian  HPI Ray Jackson is a 62 y.o. male with past medical history as noted below who presents for apparent behavioral disturbance, as well as concern for possible UTI.  Per EMS, the patient was reporting pain in the bladder as well as some shortness of breath, but the patient refused transport to the ED for evaluation, so the facility to get IVC paperwork.    The patient himself states that he asked for a towel after shower and was refused on, and the next thing he knows facility staff returned with the police.    Patient does report suprapubic discomfort with urination as well as hesitancy and frequency.  He also reports shortness of breath mainly at night, but no cough, chest pain, or fever.   Past Medical History:  Diagnosis Date  . Constipation   . Hypertension   . Hypokalemia   . Schizophrenia [facility])     Patient Active Problem List   Diagnosis Date Noted  . Schizophrenia (HCC) 11/05/2017  . Noncompliance 11/05/2017  . UTI (urinary tract infection) 10/02/2017  . ARF (acute renal failure) (HCC) 10/02/2017  . Hyponatremia 10/02/2017  . Anemia 10/02/2017    Past Surgical History:  Procedure Laterality Date  . TONSILLECTOMY      Prior to Admission medications   Medication Sig Start Date End Date Taking? Authorizing Provider  cetirizine (ZYRTEC) 10 MG tablet Take 10 mg by mouth at bedtime.     [provider]  Cholecalciferol (VITAMIN D3) 2000 units capsule Take 2,000 Units by mouth daily.    [provider]  docusate sodium (COLACE) 100 MG capsule Take 100 mg  by mouth 2 (two) times daily.    [provider]  finasteride (PROSCAR) 5 MG tablet Take 5 mg by mouth daily.    [provider]  fluticasone (FLONASE) 50 MCG/ACT nasal spray Place 2 sprays into both nostrils daily.    [provider]  hydrochlorothiazide (HYDRODIURIL) 25 MG tablet Take 0.5 tablets (12.5 mg total) by mouth daily. 10/05/17   Johnson, Clanford L, MD  lactulose (CHRONULAC) 10 GM/15ML solution Take 20 g by mouth 2 (two) times daily.     [provider]  mirtazapine (REMERON) 30 MG tablet Take 30 mg by mouth at bedtime.    [provider]  paliperidone (INVEGA SUSTENNA) 156 MG/ML SUSP injection Inject 156 mg into the muscle every 30 (thirty) days.    [provider]  polyethylene glycol (MIRALAX / GLYCOLAX) packet Take 17 g by mouth daily.    [provider]  tamsulosin (FLOMAX) 0.4 MG CAPS capsule Take 1 capsule by mouth daily. 09/13/17   [provider]  traZODone (DESYREL) 100 MG tablet Take 200 mg by mouth at bedtime.    [provider]  vitamin B-12 (CYANOCOBALAMIN) 1000 MCG tablet Take 1,000 mcg by mouth daily.    [provider]    Allergies Patient has no known allergies.  History reviewed. No pertinent family history.  Social History Social History   Tobacco Use  . Smoking status: Current Every Day Smoker    Packs/day: 0.50  . Smokeless tobacco: Never Used  Substance  Use Topics  . Alcohol use: No    Frequency: Never  . Drug use: No    Review of Systems Level 5 caveat: Unable to obtain complete review of systems due to unreliable historian Constitutional: No fever. Cardiovascular: Denies chest pain. Respiratory: Positive for intermittent shortness of breath. Gastrointestinal: No vomiting. Genitourinary: Positive for dysuria.  Skin: Negative for rash.   ____________________________________________   PHYSICAL EXAM:  VITAL SIGNS: ED Triage Vitals  Enc Vitals Group       BP 11/05/17 1600 (!) 149/87     Pulse Rate 11/05/17 1600 79     Resp 11/05/17 1600 18     Temp 11/05/17 1600 98.7 F (37.1 C)     Temp Source 11/05/17 1600 Oral     SpO2 11/05/17 1600 95 %     Weight 11/05/17 1558 200 lb (90.7 kg)     Height 11/05/17 1558 6' (1.829 m)     Head Circumference --      Peak Flow --      Pain Score 11/05/17 1558 0     Pain Loc --      Pain Edu? --      Excl. in GC? --     Constitutional: Alert and oriented.  Relatively comfortable appearing and in no acute distress. Eyes: Conjunctivae are normal.  EOMI.  PERRLA. Head: Atraumatic. Nose: No congestion/rhinnorhea. Mouth/Throat: Mucous membranes are slightly dry.   Neck: Normal range of motion.  Cardiovascular: Normal rate, regular rhythm. Grossly normal heart sounds.  Good peripheral circulation. Respiratory: Normal respiratory effort.  No retractions. Lungs CTAB. Gastrointestinal: Soft and nontender. No distention.  Genitourinary: No flank tenderness. Musculoskeletal: Extremities warm and well perfused.  Neurologic:  Normal speech and language. No gross focal neurologic deficits are appreciated.  Skin:  Skin is warm and dry. No rash noted. Psychiatric: Calm and cooperative during my history.  ____________________________________________   LABS (all labs ordered are listed, but only abnormal results are displayed)  Labs Reviewed  COMPREHENSIVE METABOLIC PANEL - Abnormal; Notable for the following components:      Result Value   Chloride 99 (*)    Glucose, Bld 110 (*)    Albumin 3.0 (*)    ALT 13 (*)    All other components within normal limits  ACETAMINOPHEN LEVEL - Abnormal; Notable for the following components:   Acetaminophen (Tylenol), Serum <10 (*)    All other components within normal limits  CBC - Abnormal; Notable for the following components:   WBC 13.8 (*)    RBC 3.76 (*)    Hemoglobin 12.4 (*)    HCT 37.5 (*)    RDW 16.0 (*)    All other components within normal limits   URINALYSIS, COMPLETE (UACMP) WITH MICROSCOPIC - Abnormal; Notable for the following components:   Color, Urine YELLOW (*)    APPearance CLOUDY (*)    Hgb urine dipstick SMALL (*)    Protein, ur 30 (*)    Leukocytes, UA LARGE (*)    All other components within normal limits  ETHANOL  SALICYLATE LEVEL  URINE DRUG SCREEN, QUALITATIVE ([facility] ONLY)  LITHIUM LEVEL   ____________________________________________  EKG  ED ECG REPORT I, Dionne Bucy, the attending physician, personally viewed and interpreted this ECG.  Date: 11/05/2017 EKG Time: 1750 Rate: 70 Rhythm: normal sinus rhythm QRS Axis: normal Intervals: normal ST/T Wave abnormalities: Nonspecific anterior abnormalities Narrative Interpretation: no evidence of acute ischemia  ____________________________________________  RADIOLOGY  CXR: No focal infiltrate  ____________________________________________  PROCEDURES  Procedure(s) performed: No  Procedures  Critical Care performed: No ____________________________________________   INITIAL IMPRESSION / ASSESSMENT AND PLAN / ED COURSE  Pertinent labs & imaging results that were available during my care of the patient were reviewed by me and considered in my medical decision making (see chart for details).  62 year old male presents under IVC paperwork after he apparently reported some medical complaints to [facility] staff at [facility] but refused to come to the emergency department for evaluation.  Patient reports suprapubic pain and frequency, as well as some shortness of breath but denies other medical symptoms at this time.   On exam, vital signs are normal, and the patient is calm and cooperative.  He does not demonstrate any acute psychiatric symptoms during my exam.  Neuro is nonfocal.  I reviewed the past medical records in Epic; the patient did have a recent admission for UTI and hyponatremia last month.  Overall, presentation is concerning  for possible recurrent UTI.  Also consider recurrent metabolic disturbance, or dehydration.  Given the respiratory symptoms I will obtain a chest x-ray to rule out pneumonia or other acute pulmonary process.  Patient will also receive psychiatric consult given the IVC.  Disposition will be based on results of medical workup and psych eval.    ----------------------------------------- 6:03 PM on 11/05/2017 -----------------------------------------  Chest x-ray shows no focal infiltrate or other acute findings.  UA is consistent with UTI.  Patient's lab workup is otherwise reassuring.  Given that the patient has stable vital signs, is tolerating p.o. without difficulty, appears well, and has no evidence of sepsis, he would not require inpatient medicine admission for UTI.  Multiple urine cultures from last month showed pansensitive E. Coli.  I discussed the patient with Dr. Toni Amendlapacs, who recommends psychiatric admission if the patient is medically cleared.  Given that the patient would medically be stable for discharge with an oral antibiotic and has no other acute medical issues, he is appropriate for admission to the psychiatric service with oral antibiotic for UTI.  Dr. Toni Amendlapacs agrees with this plan.   ____________________________________________   FINAL CLINICAL IMPRESSION(S) / ED DIAGNOSES  Final diagnoses:  Schizophrenia, unspecified type (HCC)  Acute cystitis without hematuria      NEW MEDICATIONS STARTED DURING THIS VISIT:  New Prescriptions   No medications on file     Note:  This document was prepared using Dragon voice recognition software and may include unintentional dictation errors.        Dionne BucySiadecki, Lakisa Lotz, MD 11/05/17 [phone]

## 2017-11-05 NOTE — ED Notes (Signed)
[name] Witcher called this RN back and gave verbal consent to treat patient over the phone. She would like updates later on once results are back on pt. Informed RN taking care of pt.

## 2017-11-05 NOTE — Consult Note (Signed)
[facility] Face-to-Face Psychiatry Consult   Reason for Consult: Consult for this 62 year old man with a history of schizophrenia.  He was brought here under IVC from his act team saying that he is refusing medical treatment Referring Physician: Siadecki Patient Identification: Ray Jackson MRN:  [id number] Principal Diagnosis: Schizophrenia [facility]) Diagnosis:   Patient Active Problem List   Diagnosis Date Noted  . Schizophrenia ([place]) [F20.9] 11/05/2017  . Noncompliance [Z91.19] 11/05/2017  . UTI (urinary tract infection) [N39.0] 10/02/2017  . ARF (acute renal failure) ([place]) [N17.9] 10/02/2017  . Hyponatremia [E87.1] 10/02/2017  . Anemia [D64.9] 10/02/2017    Total Time spent with patient: 1 hour  Subjective:   Ray Jackson is a 62 y.o. male patient admitted with "they wanted me to take a shower.".  HPI: Patient interviewed.  Chart reviewed.  Patient is cooperative but not very verbal.  Commitment paperwork simply says that the patient was "talking out of his head" and that he had been non-cooperative with treatment of the urinary tract infection.  Patient says that he lives at a group home and that this morning they had been trying to get him to take a shower.  He says he had not done it right away and before he knew what the police were there.  He does not have any more insight into exactly what is going on than that.  Apparently there is also a report that an ambulance was called for his urinary tract infection and he refused to go at that time which is what led the IVC to be filed.  Patient is not very knowledgeable about his medicine.  Cannot remember when he last had his antipsychotic shot.  He denies suicidal or homicidal thought.  He denies any hallucinations.  He does present as disheveled and withdrawn with typical schizophrenia features.  Social history: A legal guardian is listed in the chart.  Unknown if any family are involved.  Patient has been living at a group home for a long  time.  Medical history: Was recently treated for dehydration and a urinary tract infection.  Full labs are not yet back yet we do not have a lithium level or a urine analysis.  Substance abuse history: Patient denies alcohol or drug abuse nothing in the chart about any of this.  Past Psychiatric History: Patient has a history of schizophrenia.  According to the chart he is supposed to be on long-acting Invega shots and lithium.  Patient cannot remember when he last got an injection.  He says he has had hospitalizations before cannot remember how long ago the last psychiatric hospitalization was.  He says he has had suicide attempts before but it has been years ago.  Risk to Self: Is patient at risk for suicide?: No, but patient needs Medical Clearance Risk to Others:   Prior Inpatient Therapy:   Prior Outpatient Therapy:    Past Medical History:  Past Medical History:  Diagnosis Date  . Constipation   . Hypertension   . Hypokalemia   . Schizophrenia [facility])     Past Surgical History:  Procedure Laterality Date  . TONSILLECTOMY     Family History: History reviewed. No pertinent family history. Family Psychiatric  History: Does not know of any Social History:  Social History   Substance and Sexual Activity  Alcohol Use No  . Frequency: Never     Social History   Substance and Sexual Activity  Drug Use No    Social History   Socioeconomic History  .  Marital status: Single    Spouse name: None  . Number of children: None  . Years of education: None  . Highest education level: None  Social Needs  . Financial resource strain: None  . Food insecurity - worry: None  . Food insecurity - inability: None  . Transportation needs - medical: None  . Transportation needs - non-medical: None  Occupational History  . None  Tobacco Use  . Smoking status: Current Every Day Smoker    Packs/day: 0.50  . Smokeless tobacco: Never Used  Substance and Sexual Activity  . Alcohol use:  No    Frequency: Never  . Drug use: No  . Sexual activity: None  Other Topics Concern  . None  Social History Narrative  . None   Additional Social History:    Allergies:  No Known Allergies  Labs:  Results for orders placed or performed during the hospital encounter of 11/05/17 (from the past 48 hour(s))  Comprehensive metabolic panel     Status: Abnormal   Collection Time: 11/05/17  4:00 PM  Result Value Ref Range   Sodium 137 135 - 145 mmol/L   Potassium 3.6 3.5 - 5.1 mmol/L   Chloride 99 (L) 101 - 111 mmol/L   CO2 30 22 - 32 mmol/L   Glucose, Bld 110 (H) 65 - 99 mg/dL   BUN 16 6 - 20 mg/dL   Creatinine, Ser 1.11 0.61 - 1.24 mg/dL   Calcium 8.9 8.9 - 10.3 mg/dL   Total Protein 6.5 6.5 - 8.1 g/dL   Albumin 3.0 (L) 3.5 - 5.0 g/dL   AST 18 15 - 41 U/L   ALT 13 (L) 17 - 63 U/L   Alkaline Phosphatase 56 38 - 126 U/L   Total Bilirubin 0.4 0.3 - 1.2 mg/dL   GFR calc non Af Amer >60 >60 mL/min   GFR calc Af Amer >60 >60 mL/min    Comment: (NOTE) The eGFR has been calculated using the CKD EPI equation. This calculation has not been validated in all clinical situations. eGFR's persistently <60 mL/min signify possible Chronic Kidney Disease.    Anion gap 8 5 - 15    Comment: Performed at [facility], [address] [place], [place] [address]  Ethanol     Status: None   Collection Time: 11/05/17  4:00 PM  Result Value Ref Range   Alcohol, Ethyl (B) <10 <10 mg/dL    Comment:        LOWEST DETECTABLE LIMIT FOR SERUM ALCOHOL IS 10 mg/dL FOR MEDICAL PURPOSES ONLY Performed at [facility], [address] [place], [place] [address]   Salicylate level     Status: None   Collection Time: 11/05/17  4:00 PM  Result Value Ref Range   Salicylate Lvl <8.5 2.8 - 30.0 mg/dL    Comment: Performed at [facility], [address] [place], [place] [address]  Acetaminophen level     Status: Abnormal   Collection Time: 11/05/17  4:00 PM  Result Value  Ref Range   Acetaminophen (Tylenol), Serum <10 (L) 10 - 30 ug/mL    Comment:        THERAPEUTIC CONCENTRATIONS VARY SIGNIFICANTLY. A RANGE OF 10-30 ug/mL MAY BE AN EFFECTIVE CONCENTRATION FOR MANY PATIENTS. HOWEVER, SOME ARE BEST TREATED AT CONCENTRATIONS OUTSIDE THIS RANGE. ACETAMINOPHEN CONCENTRATIONS >150 ug/mL AT 4 HOURS AFTER INGESTION AND >50 ug/mL AT 12 HOURS AFTER INGESTION ARE OFTEN ASSOCIATED WITH TOXIC REACTIONS. Performed at [facility],  [place], [place] [address]   cbc     Status: Abnormal   Collection Time: 11/05/17  4:00 PM  Result Value Ref Range   WBC 13.8 (H) 3.8 - 10.6 K/uL   RBC 3.76 (L) 4.40 - 5.90 MIL/uL   Hemoglobin 12.4 (L) 13.0 - 18.0 g/dL   HCT 37.5 (L) 40.0 - 52.0 %   MCV 99.7 80.0 - 100.0 fL   MCH 33.0 26.0 - 34.0 pg   MCHC 33.1 32.0 - 36.0 g/dL   RDW 16.0 (H) 11.5 - 14.5 %   Platelets 421 150 - 440 K/uL    Comment: Performed at [facility], [address] [place], [place] [address]  Lithium level     Status: None   Collection Time: 11/05/17  4:00 PM  Result Value Ref Range   Lithium Lvl 0.67 0.60 - 1.20 mmol/L    Comment: Performed at [facility], [address], [place], [place] [address]    Current Facility-Administered Medications  Medication Dose Route Frequency Provider Last Rate Last Dose  . paliperidone (INVEGA SUSTENNA) injection 156 mg  156 mg Intramuscular Once Clapacs, Madie Reno, MD       Current Outpatient Medications  Medication Sig Dispense Refill  . cetirizine (ZYRTEC) 10 MG tablet Take 10 mg by mouth at bedtime.     . Cholecalciferol (VITAMIN D3) 2000 units capsule Take 2,000 Units by mouth daily.    Marland Kitchen docusate sodium (COLACE) 100 MG capsule Take 100 mg by mouth 2 (two) times daily.    . finasteride (PROSCAR) 5 MG tablet Take 5 mg by mouth daily.    . fluticasone (FLONASE) 50 MCG/ACT nasal spray Place 2 sprays into both nostrils daily.    . hydrochlorothiazide (HYDRODIURIL) 25  MG tablet Take 0.5 tablets (12.5 mg total) by mouth daily.    Marland Kitchen lactulose (CHRONULAC) 10 GM/15ML solution Take 20 g by mouth 2 (two) times daily.     . mirtazapine (REMERON) 30 MG tablet Take 30 mg by mouth at bedtime.    . paliperidone (INVEGA SUSTENNA) 156 MG/ML SUSP injection Inject 156 mg into the muscle every 30 (thirty) days.    . polyethylene glycol (MIRALAX / GLYCOLAX) packet Take 17 g by mouth daily.    . tamsulosin (FLOMAX) 0.4 MG CAPS capsule Take 1 capsule by mouth daily.    . traZODone (DESYREL) 100 MG tablet Take 200 mg by mouth at bedtime.    . vitamin B-12 (CYANOCOBALAMIN) 1000 MCG tablet Take 1,000 mcg by mouth daily.      Musculoskeletal: Strength & Muscle Tone: within normal limits Gait & Station: normal Patient leans: N/A  Psychiatric Specialty Exam: Physical Exam  Nursing note and vitals reviewed. Constitutional: He appears well-developed and well-nourished.  HENT:  Head: Normocephalic and atraumatic.  Eyes: Conjunctivae are normal. Pupils are equal, round, and reactive to light.  Neck: Normal range of motion.  Cardiovascular: Regular rhythm and normal heart sounds.  Respiratory: Effort normal. No respiratory distress.  GI: Soft. He exhibits no distension.  Musculoskeletal: Normal range of motion.  Neurological: He is alert.  Skin: Skin is warm and dry.  Psychiatric: His affect is blunt. His speech is delayed. He is slowed and withdrawn. Thought content is not paranoid. Cognition and memory are impaired. He expresses impulsivity. He expresses no homicidal and no suicidal ideation.    Review of Systems  Constitutional: Negative.   HENT: Negative.   Eyes: Negative.   Respiratory: Negative.  Cardiovascular: Negative.   Gastrointestinal: Negative.   Musculoskeletal: Negative.   Skin: Negative.   Neurological: Negative.   Psychiatric/Behavioral: Negative for depression, hallucinations, memory loss, substance abuse and suicidal ideas. The patient is not  nervous/anxious and does not have insomnia.     Blood pressure (!) 149/87, pulse 79, temperature 98.7 F (37.1 C), temperature source Oral, resp. rate 18, height 6' (1.829 m), weight 90.7 kg (200 lb), SpO2 95 %.Body mass index is 27.12 kg/m.  General Appearance: Disheveled  Eye Contact:  Minimal  Speech:  Slow  Volume:  Decreased  Mood:  Euthymic  Affect:  Blunt and Flat  Thought Process:  Disorganized  Orientation:  Full (Time, Place, and Person)  Thought Content:  Illogical and Tangential  Suicidal Thoughts:  No  Homicidal Thoughts:  No  Memory:  Immediate;   Fair Recent;   Fair Remote;   Fair  Judgement:  Impaired  Insight:  Shallow  Psychomotor Activity:  Decreased  Concentration:  Concentration: Poor  Recall:  [employer] of Knowledge:  Fair  Language:  Poor  Akathisia:  No  Handed:  Right  AIMS (if indicated):     Assets:  Desire for Improvement Housing  ADL's:  Impaired  Cognition:  Impaired,  Mild  Sleep:        Treatment Plan Summary: Daily contact with patient to assess and evaluate symptoms and progress in treatment, Medication management and Plan 62 year old man with schizophrenia.  And possible urinary tract infection.  Patient is disheveled and shows signs of poor self-care.  Workup pending but I think it would probably be better to plan to admit him to psychiatry for poor self-care.  Ordered Invega shot.  Lithium level and urinalysis pending.  Case reviewed with TTS and ER physician.  Disposition: Recommend psychiatric Inpatient admission when medically cleared. Supportive therapy provided about ongoing stressors.  Alethia Berthold, MD 11/05/2017 5:40 PM

## 2017-11-05 NOTE — Progress Notes (Signed)
[facility] appeared depressed and calm but denies any suicidal thoughts and ideations , his affect is flat there are no signs of hallucinations,delution, or bizarre behaviors, thinking is logical and thought content is appropriate , patient is stable and responding well and participating in assessment process. Patient came in treatment of UTI and schizophrenia and with a history of suicide ideation in the past, patient has since denies any SI/HI and no signs of AVH noted at this time. Skin checks and body search done, skin is clean and no contraband found, two RNs did body search Ale/Abi. Patient is made aware of our safety protocols and educate patient on coping skills, patient is secure in room 322, safety rounds is maintained no distress.

## 2017-11-05 NOTE — [facility] Group Notes (Signed)
[facility] Group Notes:  (Nursing/MHT/Case Management/Adjunct)  Date:  11/05/2017  Time:  11:13 PM  Type of Therapy:  Group Therapy  Participation Level:  Did Not Attend   Summary of Progress/Problems:  Ray Jackson 11/05/2017, 11:13 PM

## 2017-11-06 ENCOUNTER — Encounter: Payer: Self-pay | Admitting: Psychiatry

## 2017-11-06 LAB — LIPID PANEL
CHOL/HDL RATIO: 2.8 ratio
CHOLESTEROL: 137 mg/dL (ref 0–200)
HDL: 49 mg/dL (ref >40)
LDL Cholesterol: 73 mg/dL (ref 0–99)
Triglycerides: 73 mg/dL (ref <150)
VLDL: 15 mg/dL (ref 0–40)

## 2017-11-06 LAB — HEMOGLOBIN A1C
Hgb A1c MFr Bld: 4.5 % — ABNORMAL LOW (ref 4.8–5.6)
Mean Plasma Glucose: 82.45 mg/dL

## 2017-11-06 LAB — TSH: TSH: 4.556 u[IU]/mL — AB (ref 0.350–4.500)

## 2017-11-06 LAB — T4, FREE: Free T4: 0.84 ng/dL (ref 0.61–1.12)

## 2017-11-06 MED ORDER — HYDROCHLOROTHIAZIDE 25 MG PO TABS
25.0000 mg | ORAL_TABLET | Freq: Every day | ORAL | Status: DC
  Administered 2017-11-07 – 2017-11-15 (×9): 25 mg via ORAL
  Filled 2017-11-06 (×9): qty 1

## 2017-11-06 MED ORDER — FLUTICASONE PROPIONATE 50 MCG/ACT NA SUSP
2.0000 | Freq: Two times a day (BID) | NASAL | Status: DC
  Administered 2017-11-06 – 2017-11-15 (×17): 2 via NASAL
  Filled 2017-11-06: qty 16

## 2017-11-06 MED ORDER — SULFAMETHOXAZOLE-TRIMETHOPRIM 800-160 MG PO TABS
1.0000 | ORAL_TABLET | Freq: Two times a day (BID) | ORAL | Status: DC
  Filled 2017-11-06: qty 1

## 2017-11-06 MED ORDER — LORATADINE 10 MG PO TABS
10.0000 mg | ORAL_TABLET | Freq: Every day | ORAL | Status: DC
  Administered 2017-11-07 – 2017-11-15 (×9): 10 mg via ORAL
  Filled 2017-11-06 (×10): qty 1

## 2017-11-06 MED ORDER — CEPHALEXIN 500 MG PO CAPS
500.0000 mg | ORAL_CAPSULE | Freq: Two times a day (BID) | ORAL | Status: DC
  Administered 2017-11-06 – 2017-11-15 (×19): 500 mg via ORAL
  Filled 2017-11-06 (×20): qty 1

## 2017-11-06 NOTE — [facility] Counselor (Signed)
CSW spoke with patients group home [facility] and the patient is able to return back to his placement there at discharge.   Ray Shearsassandra Green [place], MSW, [place]CSWA, LCASA 11/06/2017 11:39 AM

## 2017-11-06 NOTE — H&P (Addendum)
Psychiatric Admission Assessment Adult  Patient Identification: Ray Jackson MRN:  [id number] Date of Evaluation:  11/06/2017 Chief Complaint:  "I didn't want to take a shower."  Principal Diagnosis: Schizophrenia (HCC) Diagnosis:   Patient Active Problem List   Diagnosis Date Noted  . Schizophrenia (HCC) [F20.9] 11/05/2017    Priority: High  . UTI (urinary tract infection) [N39.0] 10/02/2017  . Anemia [D64.9] 10/02/2017   History of Present Illness: 62 yo male admitted due to agitation at his group home. Pt states that the staff wanted him to take a shower but he was feeling too tired. They refused to give him his medications unless he did. He states, "They wouldn't give me towel so then I refused." He states that he also told them that he was having "bladder pain." and then allegedly refused treatment for it. He states, "next thing I know the police are there to pick me up." She states, "They said I was in trouble or noncompliant or something like that." He denies getting violent or agitated. He states that he hates living there and "it has been miserable." He denies feeling depressed. Denies AH, VH, HI. He denies that he has been refusing medications and takes them everyday. He is glad that the UTI is being treated. He is overall organized in thought process. He is not oriented to time, he guess "April 1990" but then corrects himself. He is not sure what city he is in but is aware of [place]. He states that president is "bush."  He states that his sister is his legal guardian. Pt is very calm and pleasant. He is malodorous and smells of urine. He states that he feels tired today. He received Invega injection in ED. PT was hospitalized on the medical unit in February for UTI and dehydration. Pt states that he has trouble breathing from his nose when he is trying to sleep. He denies trouble breathing from mouth.   Associated Signs/Symptoms: Depression Symptoms:  fatigue, (Hypo) Manic  Symptoms:  Impulsivity, Anxiety Symptoms:  Denies Psychotic Symptoms:  Denies PTSD Symptoms: Negative Total Time spent with patient: 45 minutes  Past Psychiatric History: History of schizophrenia. Has had many inpatient admissions. He states that he was hospitalized earlier this year but does not recall were. His last hospitalization at [facility] was in 2015. He can function well when on proper medications. He reports several suicide attempts in the distant past by "stabbing myself when I had a reaction to medications." He follows with ACTT team.    Is the patient at risk to self? No.  Has the patient been a risk to self in the past 6 months? No.  Has the patient been a risk to self within the distant past? Yes.    Is the patient a risk to others? Yes.    Has the patient been a risk to others in the past 6 months? No.  Has the patient been a risk to others within the distant past? No.    Alcohol Screening: 1. How often do you have a drink containing alcohol?: 2 to 4 times a month 2. How many drinks containing alcohol do you have on a typical day when you are drinking?: 3 or 4 3. How often do you have six or more drinks on one occasion?: Never AUDIT-C Score: 3 4. How often during the last year have you found that you were not able to stop drinking once you had started?: Less than monthly 5. How often during the last  year have you failed to do what was normally expected from you becasue of drinking?: Less than monthly 6. How often during the last year have you needed a first drink in the morning to get yourself going after a heavy drinking session?: Less than monthly 7. How often during the last year have you had a feeling of guilt of remorse after drinking?: Monthly 8. How often during the last year have you been unable to remember what happened the night before because you had been drinking?: Less than monthly 9. Have you or someone else been injured as a result of your drinking?: No 10. Has a  relative or friend or a doctor or another health worker been concerned about your drinking or suggested you cut down?: No Alcohol Use Disorder Identification Test Final Score (AUDIT): 9 Intervention/Follow-up: Alcohol Education Substance Abuse History in the last 12 months:  No. Consequences of Substance Abuse: Negative Previous Psychotropic Medications: Yes , Haldol Psychological Evaluations: Yes  Past Medical History:  Past Medical History:  Diagnosis Date  . Constipation   . Hypertension   . Hypokalemia   . Schizophrenia [facility])     Past Surgical History:  Procedure Laterality Date  . TONSILLECTOMY     Family History: History reviewed. No pertinent family history. Family Psychiatric  History: Unknown Tobacco Screening: Have you used any form of tobacco in the last 30 days? (Cigarettes, Smokeless Tobacco, Cigars, and/or Pipes): Yes Tobacco use, Select all that apply: smokeless tobacco use, not daily Are you interested in Tobacco Cessation Medications?: No, patient refused Counseled patient on smoking cessation including recognizing danger situations, developing coping skills and basic information about quitting provided: Yes Social History: Pt reports that his was born in [place]. His mother is deceased and his father lives in [place]. His sister is his legal guardian. He lives in  Group home called [facility]. He thinks he has been there about 2-3 years. He is on SSI. He used to work in [employer]. No legal issues currently.  Social History   Substance and Sexual Activity  Alcohol Use No  . Frequency: Never     Social History   Substance and Sexual Activity  Drug Use No    Additional Social History:                           Allergies:  No Known Allergies Lab Results:  Results for orders placed or performed during the hospital encounter of 11/05/17 (from the past 48 hour(s))  Hemoglobin A1c     Status: Abnormal   Collection Time: 11/06/17  7:20 AM   Result Value Ref Range   Hgb A1c MFr Bld 4.5 (L) 4.8 - 5.6 %    Comment: (NOTE) Pre diabetes:          5.7%-6.4% Diabetes:              >6.4% Glycemic control for   <7.0% adults with diabetes    Mean Plasma Glucose 82.45 mg/dL    Comment: Performed at [facility], [address], [place], [place] [address]  Lipid panel     Status: None   Collection Time: 11/06/17  7:20 AM  Result Value Ref Range   Cholesterol 137 0 - 200 mg/dL   Triglycerides 73 <604 mg/dL   HDL 49 >54 mg/dL   Total CHOL/HDL Ratio 2.8 RATIO   VLDL 15 0 - 40 mg/dL   LDL Cholesterol 73 0 -  99 mg/dL    Comment:        Total Cholesterol/HDL:CHD Risk Coronary Heart Disease Risk Table                     Men   Women  1/2 Average Risk   3.4   3.3  Average Risk       5.0   4.4  2 X Average Risk   9.6   7.1  3 X Average Risk  23.4   11.0        Use the calculated Patient Ratio above and the CHD Risk Table to determine the patient's CHD Risk.        ATP III CLASSIFICATION (LDL):  <100     mg/dL   Optimal  161-096  mg/dL   Near or Above                    Optimal  130-159  mg/dL   Borderline  045-409  mg/dL   High  >811     mg/dL   Very High Performed at [facility], [address] [place], [place] [address]   TSH     Status: Abnormal   Collection Time: 11/06/17  7:20 AM  Result Value Ref Range   TSH 4.556 (H) 0.350 - 4.500 uIU/mL    Comment: Performed by a 3rd Generation assay with a functional sensitivity of <=0.01 uIU/mL. Performed at [facility], [address] [place], [place] [address]     Blood Alcohol level:  Lab Results  Component Value Date   [facility] <10 11/05/2017   ETH <10 10/02/2017    Metabolic Disorder Labs:  Lab Results  Component Value Date   HGBA1C 4.5 (L) 11/06/2017   MPG 82.45 11/06/2017   No results found for: PROLACTIN Lab Results  Component Value Date   CHOL 137 11/06/2017   TRIG 73 11/06/2017   HDL 49 11/06/2017   CHOLHDL 2.8 11/06/2017    VLDL 15 11/06/2017   LDLCALC 73 11/06/2017   LDLCALC 47 04/06/2014    Current Medications: Current Facility-Administered Medications  Medication Dose Route Frequency Provider Last Rate Last Dose  . acetaminophen (TYLENOL) tablet 650 mg  650 mg Oral Q6H PRN Clapacs, John T, MD      . alum & mag hydroxide-simeth (MAALOX/MYLANTA) 200-200-20 MG/5ML suspension 30 mL  30 mL Oral Q4H PRN Clapacs, John T, MD      . cephALEXin (KEFLEX) capsule 500 mg  500 mg Oral Q12H Pink Maye, Ileene Hutchinson, MD   500 mg at 11/06/17 0815  . cholecalciferol (VITAMIN D) tablet 1,000 Units  1,000 Units Oral Daily Clapacs, Jackquline Denmark, MD   1,000 Units at 11/06/17 0759  . clonazePAM (KLONOPIN) tablet 1 mg  1 mg Oral QHS Clapacs, Jackquline Denmark, MD   1 mg at 11/05/17 2214  . docusate sodium (COLACE) capsule 100 mg  100 mg Oral BID Clapacs, Jackquline Denmark, MD   100 mg at 11/06/17 0759  . finasteride (PROSCAR) tablet 5 mg  5 mg Oral Daily Clapacs, Jackquline Denmark, MD   5 mg at 11/06/17 0759  . fluticasone (FLONASE) 50 MCG/ACT nasal spray 2 spray  2 spray Each Nare Daily Clapacs, Jackquline Denmark, MD   2 spray at 11/06/17 0800  . hydrochlorothiazide (MICROZIDE) capsule 12.5 mg  12.5 mg Oral Daily Clapacs, John T, MD   12.5 mg at 11/06/17 0759  . hydrOXYzine (ATARAX/VISTARIL) tablet 25 mg  25 mg Oral  TID PRN Clapacs, Jackquline DenmarkJohn T, MD      . lactulose (CHRONULAC) 10 GM/15ML solution 20 g  20 g Oral BID Clapacs, John T, MD   20 g at 11/06/17 0800  . lithium carbonate (ESKALITH) CR tablet 450 mg  450 mg Oral Q12H Clapacs, Jackquline DenmarkJohn T, MD   450 mg at 11/06/17 0759  . magnesium hydroxide (MILK OF MAGNESIA) suspension 30 mL  30 mL Oral Daily PRN Clapacs, John T, MD      . mirtazapine (REMERON) tablet 30 mg  30 mg Oral QHS Clapacs, Jackquline DenmarkJohn T, MD   30 mg at 11/05/17 2214  . polyethylene glycol (MIRALAX / GLYCOLAX) packet 17 g  17 g Oral Daily Clapacs, John T, MD   17 g at 11/06/17 0800  . risperiDONE (RISPERDAL M-TABS) disintegrating tablet 1 mg  1 mg Oral QHS Clapacs, John T, MD   1 mg at  11/05/17 2237  . tamsulosin (FLOMAX) capsule 0.4 mg  0.4 mg Oral Daily Clapacs, John T, MD   0.4 mg at 11/06/17 0759  . traZODone (DESYREL) tablet 100 mg  100 mg Oral QHS Clapacs, Jackquline DenmarkJohn T, MD   100 mg at 11/05/17 2214  . vitamin B-12 (CYANOCOBALAMIN) tablet 1,000 mcg  1,000 mcg Oral Daily Clapacs, Jackquline DenmarkJohn T, MD   1,000 mcg at 11/06/17 [id number]   PTA Medications: Medications Prior to Admission  Medication Sig Dispense Refill Last Dose  . cetirizine (ZYRTEC) 10 MG tablet Take 10 mg by mouth at bedtime.    10/01/2017 at Unknown time  . Cholecalciferol (VITAMIN D3) 2000 units capsule Take 2,000 Units by mouth daily.   10/02/2017 at Unknown time  . docusate sodium (COLACE) 100 MG capsule Take 100 mg by mouth 2 (two) times daily.   10/02/2017 at Unknown time  . finasteride (PROSCAR) 5 MG tablet Take 5 mg by mouth daily.   10/02/2017 at Unknown time  . fluticasone (FLONASE) 50 MCG/ACT nasal spray Place 2 sprays into both nostrils daily.   10/02/2017 at Unknown time  . hydrochlorothiazide (HYDRODIURIL) 25 MG tablet Take 0.5 tablets (12.5 mg total) by mouth daily.     Marland Kitchen. lactulose (CHRONULAC) 10 GM/15ML solution Take 20 g by mouth 2 (two) times daily.    10/02/2017 at Unknown time  . mirtazapine (REMERON) 30 MG tablet Take 30 mg by mouth at bedtime.   10/01/2017 at Unknown time  . paliperidone (INVEGA SUSTENNA) 156 MG/ML SUSP injection Inject 156 mg into the muscle every 30 (thirty) days.   09/29/2017 at Unknown time  . polyethylene glycol (MIRALAX / GLYCOLAX) packet Take 17 g by mouth daily.   10/02/2017 at Unknown time  . tamsulosin (FLOMAX) 0.4 MG CAPS capsule Take 1 capsule by mouth daily.   10/02/2017 at Unknown time  . traZODone (DESYREL) 100 MG tablet Take 200 mg by mouth at bedtime.   10/01/2017 at Unknown time  . vitamin B-12 (CYANOCOBALAMIN) 1000 MCG tablet Take 1,000 mcg by mouth daily.   10/02/2017 at Unknown time    Musculoskeletal: Strength & Muscle Tone: within normal limits Gait & Station: normal Patient  leans: N/A  Psychiatric Specialty Exam: Physical Exam  Nursing note and vitals reviewed.   Review of Systems  Respiratory: Positive for shortness of breath.        Only when breathing through nose   Cardiovascular: Negative for chest pain.    Blood pressure (!) 153/85, pulse 73, temperature 97.8 F (36.6 C), resp. rate 18, height 6' (1.829 m), weight 57.2  kg (126 lb), SpO2 98 %.Body mass index is 17.09 kg/m.  General Appearance: Disheveled, malodorous  Eye Contact:  Fair  Speech:  Slow  Volume:  Decreased  Mood:  Euthymic  Affect:  Flat  Thought Process:  Coherent  Orientation:  Other:  Not oriented  Thought Content:  Logical  Suicidal Thoughts:  No  Homicidal Thoughts:  No  Memory:  Immediate;   Fair  Judgement:  Fair  Insight:  Fair  Psychomotor Activity:  Decreased  Concentration:  Concentration: Poor  Recall:  [employer] of Knowledge:  Fair  Language:  Fair  Akathisia:  No      Assets:  Resilience  ADL's:  Intact  Cognition:  WNL  Sleep:       Treatment Plan Summary: 62 yo male admitted due to alleged agitation and noncompliance at group home. Pt was endorsing symptom of UTI but was not being compliant with treatment at the time so IVC was filed. Group home was likely concerned about the UTI as he was hospitalized in February for UTI and dehydration. He is very calm and overall organized in conversation currently. He is compliant with all medications on the unit. HE is on Keflex for UTI. He is not fully oriented but is able to correct himself. He may have some mild cognitive impairment. He is not delirious or overtly psychotic at this time but it is very important to treat UTI so it does not progress. He received Gean Birchwood injection 156 in ED which was due tomorrow.   Plan:  Schizophrenia -Received Gean Birchwood injection 156 mg in ED -Restart home Risperdal 1 mg qhs -Restart Lithium 450 mg BID. Lithium level was 0.67 -Restart Remeron 30 mg  qhs  HTN -HCTZ 12.5 mg  Urinary symptoms/UTI -Keflex 500 mg BID for 14 days -Finasteride -Flomax 0.4 mg daily  -Increase nasal spray to BID for trouble breathing through nose  CMP-no acute concerns, CBC with elevated WBC-likely due to UTI, Hg low but overall improved from past CBC, UA positive for UTI, TSH slightly elevated-will check T4 EKG reviewed QTc 423  Dispo -Pt has legal guardian. CSW spoke with her. He will return to group home when stable.   Observation Level/Precautions:  15 minute checks  Laboratory:  DOne in ED  Psychotherapy:    Medications:    Consultations:    Discharge Concerns:    Estimated LOS: 3-5 days  Other:     Physician Treatment Plan for Primary Diagnosis: Schizophrenia (HCC) Long Term Goal(s): Improvement in symptoms so as ready for discharge  Short Term Goals: Ability to demonstrate self-control will improve  I certify that inpatient services furnished can reasonably be expected to improve the patient's condition.    Haskell Riling, MD 3/20/201912:25 PM

## 2017-11-06 NOTE — Tx Team (Signed)
Interdisciplinary Treatment and Diagnostic Plan Update  11/06/2017 Time of Session: 11AM Ray Jackson MRN: [id number]  Principal Diagnosis: <principal problem not specified>  Secondary Diagnoses: Active Problems:   Schizophrenia (HCC)   Current Medications:  Current Facility-Administered Medications  Medication Dose Route Frequency Provider Last Rate Last Dose  . acetaminophen (TYLENOL) tablet 650 mg  650 mg Oral Q6H PRN Clapacs, John T, MD      . alum & mag hydroxide-simeth (MAALOX/MYLANTA) 200-200-20 MG/5ML suspension 30 mL  30 mL Oral Q4H PRN Clapacs, John T, MD      . cephALEXin (KEFLEX) capsule 500 mg  500 mg Oral Q12H McNew, Ileene Hutchinson, MD   500 mg at 11/06/17 0815  . cholecalciferol (VITAMIN D) tablet 1,000 Units  1,000 Units Oral Daily Clapacs, Jackquline Denmark, MD   1,000 Units at 11/06/17 0759  . clonazePAM (KLONOPIN) tablet 1 mg  1 mg Oral QHS Clapacs, Jackquline Denmark, MD   1 mg at 11/05/17 2214  . docusate sodium (COLACE) capsule 100 mg  100 mg Oral BID Clapacs, Jackquline Denmark, MD   100 mg at 11/06/17 0759  . finasteride (PROSCAR) tablet 5 mg  5 mg Oral Daily Clapacs, Jackquline Denmark, MD   5 mg at 11/06/17 0759  . fluticasone (FLONASE) 50 MCG/ACT nasal spray 2 spray  2 spray Each Nare Daily Clapacs, Jackquline Denmark, MD   2 spray at 11/06/17 0800  . hydrochlorothiazide (MICROZIDE) capsule 12.5 mg  12.5 mg Oral Daily Clapacs, John T, MD   12.5 mg at 11/06/17 0759  . hydrOXYzine (ATARAX/VISTARIL) tablet 25 mg  25 mg Oral TID PRN Clapacs, John T, MD      . lactulose (CHRONULAC) 10 GM/15ML solution 20 g  20 g Oral BID Clapacs, John T, MD   20 g at 11/06/17 0800  . lithium carbonate (ESKALITH) CR tablet 450 mg  450 mg Oral Q12H Clapacs, Jackquline Denmark, MD   450 mg at 11/06/17 0759  . magnesium hydroxide (MILK OF MAGNESIA) suspension 30 mL  30 mL Oral Daily PRN Clapacs, John T, MD      . mirtazapine (REMERON) tablet 30 mg  30 mg Oral QHS Clapacs, Jackquline Denmark, MD   30 mg at 11/05/17 2214  . polyethylene glycol (MIRALAX / GLYCOLAX) packet 17 g  17  g Oral Daily Clapacs, John T, MD   17 g at 11/06/17 0800  . risperiDONE (RISPERDAL M-TABS) disintegrating tablet 1 mg  1 mg Oral QHS Clapacs, John T, MD   1 mg at 11/05/17 2237  . tamsulosin (FLOMAX) capsule 0.4 mg  0.4 mg Oral Daily Clapacs, John T, MD   0.4 mg at 11/06/17 0759  . traZODone (DESYREL) tablet 100 mg  100 mg Oral QHS Clapacs, Jackquline Denmark, MD   100 mg at 11/05/17 2214  . vitamin B-12 (CYANOCOBALAMIN) tablet 1,000 mcg  1,000 mcg Oral Daily Clapacs, Jackquline Denmark, MD   1,000 mcg at 11/06/17 4098   PTA Medications: Medications Prior to Admission  Medication Sig Dispense Refill Last Dose  . cetirizine (ZYRTEC) 10 MG tablet Take 10 mg by mouth at bedtime.    10/01/2017 at Unknown time  . Cholecalciferol (VITAMIN D3) 2000 units capsule Take 2,000 Units by mouth daily.   10/02/2017 at Unknown time  . docusate sodium (COLACE) 100 MG capsule Take 100 mg by mouth 2 (two) times daily.   10/02/2017 at Unknown time  . finasteride (PROSCAR) 5 MG tablet Take 5 mg by mouth daily.   10/02/2017 at  Unknown time  . fluticasone (FLONASE) 50 MCG/ACT nasal spray Place 2 sprays into both nostrils daily.   10/02/2017 at Unknown time  . hydrochlorothiazide (HYDRODIURIL) 25 MG tablet Take 0.5 tablets (12.5 mg total) by mouth daily.     Marland Kitchen lactulose (CHRONULAC) 10 GM/15ML solution Take 20 g by mouth 2 (two) times daily.    10/02/2017 at Unknown time  . mirtazapine (REMERON) 30 MG tablet Take 30 mg by mouth at bedtime.   10/01/2017 at Unknown time  . paliperidone (INVEGA SUSTENNA) 156 MG/ML SUSP injection Inject 156 mg into the muscle every 30 (thirty) days.   09/29/2017 at Unknown time  . polyethylene glycol (MIRALAX / GLYCOLAX) packet Take 17 g by mouth daily.   10/02/2017 at Unknown time  . tamsulosin (FLOMAX) 0.4 MG CAPS capsule Take 1 capsule by mouth daily.   10/02/2017 at Unknown time  . traZODone (DESYREL) 100 MG tablet Take 200 mg by mouth at bedtime.   10/01/2017 at Unknown time  . vitamin B-12 (CYANOCOBALAMIN) 1000 MCG  tablet Take 1,000 mcg by mouth daily.   10/02/2017 at Unknown time    Patient Stressors: Financial difficulties Health problems Medication change or noncompliance Occupational concerns  Patient Strengths: Ability for insight Capable of independent living Motivation for treatment/growth Supportive family/friends  Treatment Modalities: Medication Management, Group therapy, Case management,  1 to 1 session with clinician, Psychoeducation, Recreational therapy.   Physician Treatment Plan for Primary Diagnosis: <principal problem not specified> Long Term Goal(s):     Short Term Goals:    Medication Management: Evaluate patient's response, side effects, and tolerance of medication regimen.  Therapeutic Interventions: 1 to 1 sessions, Unit Group sessions and Medication administration.  Evaluation of Outcomes: Progressing  Physician Treatment Plan for Secondary Diagnosis: Active Problems:   Schizophrenia (HCC)  Long Term Goal(s):     Short Term Goals:       Medication Management: Evaluate patient's response, side effects, and tolerance of medication regimen.  Therapeutic Interventions: 1 to 1 sessions, Unit Group sessions and Medication administration.  Evaluation of Outcomes: Progressing   RN Treatment Plan for Primary Diagnosis: <principal problem not specified> Long Term Goal(s): Knowledge of disease and therapeutic regimen to maintain health will improve  Short Term Goals: Ability to verbalize feelings will improve, Ability to identify and develop effective coping behaviors will improve and Compliance with prescribed medications will improve  Medication Management: RN will administer medications as ordered by provider, will assess and evaluate patient's response and provide education to patient for prescribed medication. RN will report any adverse and/or side effects to prescribing provider.  Therapeutic Interventions: 1 on 1 counseling sessions, Psychoeducation,  Medication administration, Evaluate responses to treatment, Monitor vital signs and CBGs as ordered, Perform/monitor CIWA, COWS, AIMS and Fall Risk screenings as ordered, Perform wound care treatments as ordered.  Evaluation of Outcomes: Progressing   LCSW Treatment Plan for Primary Diagnosis: <principal problem not specified> Long Term Goal(s): Safe transition to appropriate next level of care at discharge, Engage patient in therapeutic group addressing interpersonal concerns.  Short Term Goals: Engage patient in aftercare planning with referrals and resources and Increase skills for wellness and recovery  Therapeutic Interventions: Assess for all discharge needs, 1 to 1 time with Social worker, Explore available resources and support systems, Assess for adequacy in community support network, Educate family and significant other(s) on suicide prevention, Complete Psychosocial Assessment, Interpersonal group therapy.  Evaluation of Outcomes: Progressing   Progress in Treatment: Attending groups: Yes. Participating in groups: Yes.  Taking medication as prescribed: Yes. Toleration medication: Yes. Family/Significant other contact made: Yes, individual(s) contacted:  [name], Guardian/sister, [phone]  Patient understands diagnosis: Yes. Discussing patient identified problems/goals with staff: Yes. Medical problems stabilized or resolved: Yes. Denies suicidal/homicidal ideation: Yes. Issues/concerns per patient self-inventory: No. Other:   New problem(s) identified: No, Describe:  None  New Short Term/Long Term Goal(s): "To get over this UTI."  Discharge Plan or Barriers: To return back to his group home and continue to work with his ACTT Team.   Reason for Continuation of Hospitalization: Medical Issues Medication stabilization  Estimated Length of Stay: 3-5 days Attendees: Patient: Ray MaxcyRoy Tudisco 11/06/2017 11:16 AM  Physician: Corinna GabHolly McNew, MD 11/06/2017 11:16 AM  Nursing:  Hulan AmatoGwen Farrish,  RN 11/06/2017 11:16 AM  RN Care Manager: 11/06/2017 11:16 AM  Social Worker: Johny Shearsassandra Tiara Bartoli, LCSWA 11/06/2017 11:16 AM  Recreational Therapist:  11/06/2017 11:16 AM  Other: Heidi DachKelsey Craig, LCSW 11/06/2017 11:16 AM  Other:  11/06/2017 11:16 AM  Other: 11/06/2017 11:16 AM    Scribe for Treatment Team: Johny Shearsassandra  Brinton Brandel, LCSW 11/06/2017 11:16 AM

## 2017-11-06 NOTE — BHH Counselor (Signed)
Adult Comprehensive Assessment  Patient ID: Ray Jackson, male   DOB: 10-16-1955, 62 y.o.   MRN: [id number]  Information Source: Information source: Patient  Current Stressors:  Physical health (include injuries & life threatening diseases): UTI  Living/Environment/Situation:  Living Arrangements: ([facility]) Living conditions (as described by patient or guardian): I have 2 roomates, they dont want to talk, they are selfish.  How long has patient lived in current situation?: a fea years What is atmosphere in current home: Comfortable  Family History:  Marital status: Single Are you sexually active?: No What is your sexual orientation?: Heterosexual Has your sexual activity been affected by drugs, alcohol, medication, or emotional stress?: N/A Does patient have children?: No  Childhood History:  By whom was/is the patient raised?: Both parents Description of patient's relationship with caregiver when they were a child: Father- It was rough, I was rebellious, Mother- was pretty good Patient's description of current relationship with people who raised him/her: Father- We are in good spirits now, mother- deceased. How were you disciplined when you got in trouble as a child/adolescent?: I was repremanded, cut out from things. Does patient have siblings?: Yes Number of Siblings: 1 Description of patient's current relationship with siblings: Sister, we have a pretty good relationship Did patient suffer any verbal/emotional/physical/sexual abuse as a child?: No Did patient suffer from severe childhood neglect?: No Has patient ever been sexually abused/assaulted/raped as an adolescent or adult?: No Was the patient ever a victim of a crime or a disaster?: No Witnessed domestic violence?: No Has patient been effected by domestic violence as an adult?: No  Education:  Highest grade of school patient has completed: 12th grade Currently a student?: No Learning disability?:  No  Employment/Work Situation:   Employment situation: Unemployed Patient's job has been impacted by current illness: Yes What is the longest time patient has a held a job?: 1 year Where was the patient employed at that time?: [employer] Has patient ever been in the Eli Lilly and Companymilitary?: No Are There Guns or Other Weapons in Your Home?: No  Financial Resources:   Financial resources: [employer] SSI, Support from parents / caregiver Does patient have a Lawyerrepresentative payee or guardian?: Yes Name of representative payee or guardian: Sheppard EvensBeth Witch- sister- [phone]   Alcohol/Substance Abuse:   What has been your use of drugs/alcohol within the last 12 months?: Denies Use  Social Support System:   Patient's Community Support System: Good Describe Community Support System: Sister, ACTT Team, Group Home Type of faith/religion: I believe in God How does patient's faith help to cope with current illness?: Prayer helps  Leisure/Recreation:   Leisure and Hobbies: Watching movies/ eating  Strengths/Needs:   What things does the patient do well?: Karate  In what areas does patient struggle / problems for patient: Talking to myself  Discharge Plan:   Does patient have access to transportation?: Yes Will patient be returning to same living situation after discharge?: Yes Currently receiving community mental health services: Yes (From Whom)([facility]) Does patient have financial barriers related to discharge medications?: Yes Patient description of barriers related to discharge medications: Medicaid  Summary/Recommendations:   Summary and Recommendations (to be completed by the evaluator): Patient is a 62 year old Caucasian male admitted under an IVC and diagnosed with Schizophrenia.  Patient lives at [facility] [place]. Patient also has an ACTT team with [facility]. His sister [name] is his legal guardian. His affect was congruent. He  doesn't  report any substance use. His UDS was negative for all substances. At discharge, patient will return back to his group home and follow up with his ACTT team. While here, patient will benefit from crisis stabilization, medication evaluation, group therapy and psychoeducation, in addition to case management for discharge planning. At discharge, it is recommended that patient remain compliant with the established discharge plan and continue treatment.   Johny Shears. 11/06/2017

## 2017-11-06 NOTE — B[facility] Suicide Risk Assessment (Signed)
[facility] INPATIENT:  Family/Significant Other Suicide Prevention Education  Suicide Prevention Education:  Education Completed; Ray LincolnBeth Jackson, CytogeneticistGuardian/sister, [phone] has been identified by the patient as the family member/significant other with whom the patient will be residing, and identified as the person(s) who will aid the patient in the event of a mental health crisis (suicidal ideations/suicide attempt).  With written consent from the patient, the family member/significant other has been provided the following suicide prevention education, prior to the and/or following the discharge of the patient.  The suicide prevention education provided includes the following:  Suicide risk factors  Suicide prevention and interventions  National Suicide Hotline telephone number  [facility] assessment telephone number  [facility] Emergency Assistance 911  [facility] and/or Residential Mobile Crisis Unit telephone number  Request made of family/significant other to:  Remove weapons (e.g., guns, rifles, knives), all items previously/currently identified as safety concern.    Remove drugs/medications (over-the-counter, prescriptions, illicit drugs), all items previously/currently identified as a safety concern.  The family member/significant other verbalizes understanding of the suicide prevention education information provided.  The family member/significant other agrees to remove the items of safety concern listed above.  CSW spoke with the patients Conservation officer, historic buildingsGuardian/Sister Ray Jackson. She reports that the patient works with [facility] [place] [facility] [place]. She also reports that the patient lives at [facility] [place] and is able to go back to his placement there. She will sign all consents and return them along with the Guardianship paperwork to the CSW. She reports that the patient does not have any access to any guns/weapons in the group home. No other  concerns reported.   Ray ShearsCassandra  Ray Jackson 11/06/2017, 10:05 AM

## 2017-11-06 NOTE — [facility] Suicide Risk Assessment (Signed)
[facility]   Nursing information obtained from:    Demographic factors:    62 yo male, living at group home with legal guardian Current Mental Status:   flat affect, overall organized but not oriented Loss Factors:    unhappy at group home Historical Factors:   long history of schizophrenia Risk Reduction Factors:   support  Total Time spent with patient: 45 minutes Principal Problem: Schizophrenia (HCC) Diagnosis:   Patient Active Problem List   Diagnosis Date Noted  . Schizophrenia (HCC) [F20.9] 11/05/2017    Priority: High  . UTI (urinary tract infection) [N39.0] 10/02/2017  . Anemia [D64.9] 10/02/2017   Subjective Data: See H&P  Continued Clinical Symptoms:  Alcohol Use Disorder Identification Test Final Score (AUDIT): 9 The "Alcohol Use Disorders Identification Test", Guidelines for Use in Primary Care, Second Edition.  World Science writerHealth Organization [facility](WHO). Score between 0-7:  no or low risk or alcohol related problems. Score between 8-15:  moderate risk of alcohol related problems. Score between 16-19:  high risk of alcohol related problems. Score 20 or above:  warrants further diagnostic evaluation for alcohol dependence and treatment.   CLINICAL FACTORS:   Schizophrenia:   Paranoid or undifferentiated type        COGNITIVE FEATURES THAT CONTRIBUTE TO RISK:  None    SUICIDE RISK:   Minimal: No identifiable suicidal ideation.    PLAN OF CARE: See H&P  I certify that inpatient services furnished can reasonably be expected to improve the patient's condition.   Haskell RilingHolly R Beyla Loney, MD 11/06/2017, 12:50 PM

## 2017-11-06 NOTE — Progress Notes (Signed)
Patient ID: Ray Jackson, male   DOB: April 04, 1956, 62 y.o.   MRN: [id number] Disheveled, poorly presented appearance, denied SI/HI/AVH, medication compliant.

## 2017-11-06 NOTE — BHH Counselor (Signed)
CSW spoke with the patients Conservation officer, historic buildingsGuardian/Sister [name] Witcher. She reports that the patient works with [facility] [place]. She also reports that the patient lives at [facility] [place] and is able to go back to his placement there. She will sign all consents and return them along with the Guardianship paperwork to the CSW.  Aslo, she reports that she can be reached at her office number at [phone] as for "[employer] GroupBeth Witcher" as there are 2 [name]'s that work there.  Johny Shearsassandra Daxtyn Rottenberg, MSW, GilmanLCSWA, LCASA 11/06/2017 10:04 AM

## 2017-11-06 NOTE — Plan of Care (Signed)
Patient continues to appear depressed and calm but denies any suicidal thoughts. Affect is flat there and there are no signs of hallucinations,delusions or bizarre behaviors. Patient displays logical thought process.  Patient being treated for UTI with Keflex, education provided to patient regarding the new medication. Verbalized understanding of the information provided. Milieu remains safe with q 15 minute safety checks.

## 2017-11-06 NOTE — B[facility] Group Notes (Signed)
  11/06/2017  Time: 1PM  Type of Therapy/Topic:  Group Therapy:  Emotion Regulation  Participation Level:  Did Not Attend   Description of Group:    The purpose of this group is to assist patients in learning to regulate negative emotions and experience positive emotions. Patients will be guided to discuss ways in which they have been vulnerable to their negative emotions. These vulnerabilities will be juxtaposed with experiences of positive emotions or situations, and patients will be challenged to use positive emotions to combat negative ones. Special emphasis will be placed on coping with negative emotions in conflict situations, and patients will process healthy conflict resolution skills.  Therapeutic Goals: 1. Patient will identify two positive emotions or experiences to reflect on in order to balance out negative emotions 2. Patient will label two or more emotions that they find the most difficult to experience 3. Patient will demonstrate positive conflict resolution skills through discussion and/or role plays  Summary of Patient Progress: Pt was invited to attend group but chose not to attend. CSW will continue to encourage pt to attend group throughout their admission.    Therapeutic Modalities:   Cognitive Behavioral Therapy Feelings Identification Dialectical Behavioral Therapy  Heidi DachKelsey Kiora Hallberg, MSW, LCSW 11/06/2017 2:03 PM

## 2017-11-07 MED ORDER — TRAZODONE HCL 100 MG PO TABS
100.0000 mg | ORAL_TABLET | Freq: Every evening | ORAL | Status: DC | PRN
  Administered 2017-11-07 – 2017-11-14 (×3): 100 mg via ORAL
  Filled 2017-11-07 (×3): qty 1

## 2017-11-07 MED ORDER — FLUOXETINE HCL 20 MG PO CAPS
20.0000 mg | ORAL_CAPSULE | Freq: Every day | ORAL | Status: DC
  Administered 2017-11-07 – 2017-11-15 (×9): 20 mg via ORAL
  Filled 2017-11-07 (×9): qty 1

## 2017-11-07 MED ORDER — ENSURE ENLIVE PO LIQD
237.0000 mL | Freq: Two times a day (BID) | ORAL | Status: DC
  Administered 2017-11-07 – 2017-11-15 (×15): 237 mL via ORAL

## 2017-11-07 MED ORDER — TIOTROPIUM BROMIDE MONOHYDRATE 18 MCG IN CAPS
18.0000 ug | ORAL_CAPSULE | Freq: Every day | RESPIRATORY_TRACT | Status: DC
  Administered 2017-11-07 – 2017-11-15 (×8): 18 ug via RESPIRATORY_TRACT
  Filled 2017-11-07 (×2): qty 5

## 2017-11-07 NOTE — Plan of Care (Signed)
Patient slept for Estimated Hours of 6.45; Precautionary checks every 15 minutes for safety maintained, room free of safety hazards, patient sustains no injury or falls during this shift.  

## 2017-11-07 NOTE — Plan of Care (Signed)
  Problem: Health Behavior/Discharge Planning: Goal: Ability to remain free from injury will improve Outcome: Progressing Note:  Remains safe on the unit.     Problem: Education: Goal: Emotional status will improve Outcome: Progressing Note:  Verbally denies any depression although verbalizes that he is tired and just wants to lay in bed and sleep.     Problem: Clinical Measurements: Goal: Ability to maintain clinical measurements within normal limits will improve Outcome: Progressing Goal: Will remain free from infection Outcome: Progressing Goal: Respiratory complications will improve Outcome: Progressing Goal: Cardiovascular complication will be avoided Outcome: Progressing   Problem: Nutrition: Goal: Adequate nutrition will be maintained Outcome: Progressing Note:  Appetite fair   Problem: Coping: Goal: Level of anxiety will decrease Outcome: Progressing Note:  No signs of anxiety.  No complaints of anxiety.    Problem: Elimination: Goal: Will not experience complications related to bowel motility Outcome: Progressing Goal: Will not experience complications related to urinary retention Outcome: Progressing   Problem: Pain Managment: Goal: General experience of comfort will improve Outcome: Progressing   Problem: Safety: Goal: Ability to remain free from injury will improve Outcome: Progressing Note:  Remains safe on the unit   Problem: Skin Integrity: Goal: Risk for impaired skin integrity will decrease Outcome: Progressing   Problem: Spiritual Needs Goal: Ability to function at adequate level Outcome: Progressing   Problem: Education: Goal: Ability to incorporate positive changes in behavior to improve self-esteem will improve Outcome: Not Progressing   Problem: Self-Concept: Goal: Ability to verbalize positive feelings about self will improve Outcome: Not Progressing Note:  Not forthcoming with information.  Avoids eye contact.     Problem:  Coping: Goal: Ability to interact with others will improve Outcome: Not Progressing Note:  Isolative to room.  No interaction noted with peers Goal: Ability to use eye contact when communicating with others will improve Outcome: Not Progressing Note:  Avoids eye contact   Problem: Activity: Goal: Interest or engagement in leisure activities will improve Outcome: Not Progressing Note:  No group attendance. No interaction with peers

## 2017-11-07 NOTE — [facility] Group Notes (Signed)
  11/07/2017 9am  Type of Therapy and Topic: Group Therapy: Goals Group: SMART Goals   Participation Level: Did Not Attend  Description of Group:    The purpose of a daily goals group is to assist and guide patients in setting recovery/wellness-related goals. The objective is to set goals as they relate to the crisis in which they were admitted. Patients will be using SMART goal modalities to set measurable goals. Characteristics of realistic goals will be discussed and patients will be assisted in setting and processing how one will reach their goal. Facilitator will also assist patients in applying interventions and coping skills learned in psycho-education groups to the SMART goal and process how one will achieve defined goal.   Therapeutic Goals:   -Patients will develop and document one goal related to or their crisis in which brought them into treatment.  -Patients will be guided by LCSW using SMART goal setting modality in how to set a measurable, attainable, realistic and time sensitive goal.  -Patients will process barriers in reaching goal.  -Patients will process interventions in how to overcome and successful in reaching goal.   Patient's Goal: Patient was encouraged and invited to attend group. Patient did not attend group. Social worker will continue to encourage group participation in the future.    Therapeutic Modalities:  Motivational Interviewing  Cognitive Behavioral Therapy  Crisis Intervention Model  SMART goals setting  Walker Sitar, LCSW 11/07/2017 9:43 AM   

## 2017-11-07 NOTE — Progress Notes (Signed)
[facility]  MD Progress Note  11/07/2017 3:31 PM Glenda Kunst  MRN:  [id number] Subjective:  Pt has been mostly isolative to his room. HE is overall organized but superficial in conversation. He is not oriented to Month "May" or year "'90" but is oriented to state and president. He does not appear overall confused or delirious. He states that he was not aware of any groups but is going to try to get out of his room more. When asked about SI, he states, "yeah a little" but then changes the subject. He states, "I"m having visions of medications." Denies AH or HI. He has been very calm on the unit. He appears very thin and smells of urine. HE did state that he washed his hair today.   Principal Problem: Schizophrenia (HCC) Diagnosis:   Patient Active Problem List   Diagnosis Date Noted  . Schizophrenia (HCC) [F20.9] 11/05/2017    Priority: High  . UTI (urinary tract infection) [N39.0] 10/02/2017  . Anemia [D64.9] 10/02/2017   Total Time spent with patient: 20 minutes  Past Psychiatric History: See H&P  Past Medical History:  Past Medical History:  Diagnosis Date  . ARF (acute renal failure) (HCC) 10/02/2017  . Constipation   . Hypertension   . Hypokalemia   . Hyponatremia 10/02/2017  . Noncompliance 11/05/2017  . Schizophrenia [facility])     Past Surgical History:  Procedure Laterality Date  . TONSILLECTOMY     Family History: History reviewed. No pertinent family history. Family Psychiatric  History: See H&P Social History:  Social History   Substance and Sexual Activity  Alcohol Use No  . Frequency: Never     Social History   Substance and Sexual Activity  Drug Use No    Social History   Socioeconomic History  . Marital status: Single    Spouse name: Not on file  . Number of children: Not on file  . Years of education: Not on file  . Highest education level: Not on file  Occupational History  . Not on file  Social Needs  . Financial resource strain: Not on file  . Food  insecurity:    Worry: Not on file    Inability: Not on file  . Transportation needs:    Medical: Not on file    Non-medical: Not on file  Tobacco Use  . Smoking status: Current Every Day Smoker    Packs/day: 0.50  . Smokeless tobacco: Never Used  Substance and Sexual Activity  . Alcohol use: No    Frequency: Never  . Drug use: No  . Sexual activity: Not on file  Lifestyle  . Physical activity:    Days per week: Not on file    Minutes per session: Not on file  . Stress: Not on file  Relationships  . Social connections:    Talks on phone: Not on file    Gets together: Not on file    Attends religious service: Not on file    Active member of club or organization: Not on file    Attends meetings of clubs or organizations: Not on file    Relationship status: Not on file  Other Topics Concern  . Not on file  Social History Narrative  . Not on file   Additional Social History:                         Sleep: Fair, he states that he slept well last night  Appetite:  Fair  Current Medications: Current Facility-Administered Medications  Medication Dose Route Frequency Provider Last Rate Last Dose  . acetaminophen (TYLENOL) tablet 650 mg  650 mg Oral Q6H PRN Clapacs, John T, MD      . alum & mag hydroxide-simeth (MAALOX/MYLANTA) 200-200-20 MG/5ML suspension 30 mL  30 mL Oral Q4H PRN Clapacs, John T, MD      . cephALEXin (KEFLEX) capsule 500 mg  500 mg Oral Q12H Gil Ingwersen, Ileene HutchinsonHolly R, MD   500 mg at 11/07/17 [id number]  . cholecalciferol (VITAMIN D) tablet 1,000 Units  1,000 Units Oral Daily Clapacs, Jackquline DenmarkJohn T, MD   1,000 Units at 11/07/17 [phone]  . clonazePAM (KLONOPIN) tablet 1 mg  1 mg Oral QHS Clapacs, Jackquline DenmarkJohn T, MD   1 mg at 11/06/17 2108  . docusate sodium (COLACE) capsule 100 mg  100 mg Oral BID Clapacs, Jackquline DenmarkJohn T, MD   100 mg at 11/07/17 [id number]  . feeding supplement (ENSURE ENLIVE) (ENSURE ENLIVE) liquid 237 mL  237 mL Oral BID BM Lihanna Biever R, MD      . finasteride (PROSCAR) tablet 5  mg  5 mg Oral Daily Clapacs, Jackquline DenmarkJohn T, MD   5 mg at 11/07/17 [id number]  . FLUoxetine (PROZAC) capsule 20 mg  20 mg Oral Daily Laikynn Pollio, Ileene HutchinsonHolly R, MD   20 mg at 11/07/17 1159  . fluticasone (FLONASE) 50 MCG/ACT nasal spray 2 spray  2 spray Each Nare BID Haskell RilingMcNew, Tyden Kann R, MD   2 spray at 11/07/17 [phone]  . hydrochlorothiazide (HYDRODIURIL) tablet 25 mg  25 mg Oral Daily Jodye Scali, Ileene HutchinsonHolly R, MD   25 mg at 11/07/17 [id number]  . hydrOXYzine (ATARAX/VISTARIL) tablet 25 mg  25 mg Oral TID PRN Clapacs, Jackquline DenmarkJohn T, MD      . lactulose (CHRONULAC) 10 GM/15ML solution 20 g  20 g Oral BID Clapacs, Jackquline DenmarkJohn T, MD   20 g at 11/07/17 [id number]  . lithium carbonate (ESKALITH) CR tablet 450 mg  450 mg Oral Q12H Clapacs, Jackquline DenmarkJohn T, MD   450 mg at 11/07/17 [id number]  . loratadine (CLARITIN) tablet 10 mg  10 mg Oral Daily Sigmond Patalano, Ileene HutchinsonHolly R, MD   10 mg at 11/07/17 1159  . magnesium hydroxide (MILK OF MAGNESIA) suspension 30 mL  30 mL Oral Daily PRN Clapacs, John T, MD      . mirtazapine (REMERON) tablet 30 mg  30 mg Oral QHS Clapacs, Jackquline DenmarkJohn T, MD   30 mg at 11/06/17 2108  . polyethylene glycol (MIRALAX / GLYCOLAX) packet 17 g  17 g Oral Daily Clapacs, Jackquline DenmarkJohn T, MD   17 g at 11/07/17 [id number]  . risperiDONE (RISPERDAL M-TABS) disintegrating tablet 1 mg  1 mg Oral QHS Clapacs, Jackquline DenmarkJohn T, MD   1 mg at 11/06/17 2108  . tamsulosin (FLOMAX) capsule 0.4 mg  0.4 mg Oral Daily Clapacs, Jackquline DenmarkJohn T, MD   0.4 mg at 11/07/17 [id number]  . tiotropium (SPIRIVA) inhalation capsule 18 mcg  18 mcg Inhalation Daily Ahleah Simko, Ileene HutchinsonHolly R, MD   18 mcg at 11/07/17 1159  . traZODone (DESYREL) tablet 100 mg  100 mg Oral QHS PRN Yuriy Cui, Ileene HutchinsonHolly R, MD      . vitamin B-12 (CYANOCOBALAMIN) tablet 1,000 mcg  1,000 mcg Oral Daily Clapacs, Jackquline DenmarkJohn T, MD   1,000 mcg at 11/07/17 [id number]    Lab Results:  Results for orders placed or performed during the hospital encounter of 11/05/17 (from the past 48 hour(s))  Hemoglobin A1c     Status: Abnormal   Collection Time:  11/06/17  7:20 AM  Result Value Ref Range   Hgb A1c MFr Bld 4.5 (L)  4.8 - 5.6 %    Comment: (NOTE) Pre diabetes:          5.7%-6.4% Diabetes:              >6.4% Glycemic control for   <7.0% adults with diabetes    Mean Plasma Glucose 82.45 mg/dL    Comment: Performed at [facility], [address], [place], [place] [address]  Lipid panel     Status: None   Collection Time: 11/06/17  7:20 AM  Result Value Ref Range   Cholesterol 137 0 - 200 mg/dL   Triglycerides 73 <604 mg/dL   HDL 49 >54 mg/dL   Total CHOL/HDL Ratio 2.8 RATIO   VLDL 15 0 - 40 mg/dL   LDL Cholesterol 73 0 - 99 mg/dL    Comment:        Total Cholesterol/HDL:CHD Risk Coronary Heart Disease Risk Table                     Men   Women  1/2 Average Risk   3.4   3.3  Average Risk       5.0   4.4  2 X Average Risk   9.6   7.1  3 X Average Risk  23.4   11.0        Use the calculated Patient Ratio above and the CHD Risk Table to determine the patient's CHD Risk.        ATP III CLASSIFICATION (LDL):  <100     mg/dL   Optimal  098-119  mg/dL   Near or Above                    Optimal  130-159  mg/dL   Borderline  147-829  mg/dL   High  >562     mg/dL   Very High Performed at [facility], [address] [place], [place] [address]   TSH     Status: Abnormal   Collection Time: 11/06/17  7:20 AM  Result Value Ref Range   TSH 4.556 (H) 0.350 - 4.500 uIU/mL    Comment: Performed by a 3rd Generation assay with a functional sensitivity of <=0.01 uIU/mL. Performed at [facility], [address] [place], [place] [address]   T4, free     Status: None   Collection Time: 11/06/17  7:20 AM  Result Value Ref Range   Free T4 0.84 0.61 - 1.12 ng/dL    Comment: (NOTE) Biotin ingestion may interfere with free T4 tests. If the results are inconsistent with the TSH level, previous test results, or the clinical presentation, then consider biotin interference. If needed, order repeat testing after stopping biotin. Performed at [facility], [address] [place], [place] [address]     Blood Alcohol level:  Lab Results  Component Value Date   [facility] <10 11/05/2017   ETH <10 10/02/2017    Metabolic Disorder Labs: Lab Results  Component Value Date   HGBA1C 4.5 (L) 11/06/2017   MPG 82.45 11/06/2017   No results found for: PROLACTIN Lab Results  Component Value Date   CHOL 137 11/06/2017   TRIG 73 11/06/2017   HDL 49 11/06/2017   CHOLHDL 2.8 11/06/2017   VLDL 15 11/06/2017   LDLCALC 73 11/06/2017   LDLCALC 47 04/06/2014    Physical Findings: AIMS: Facial  and Oral Movements Muscles of Facial Expression: None, normal Lips and Perioral Area: None, normal Jaw: None, normal Tongue: None, normal,Extremity Movements Upper (arms, wrists, hands, fingers): None, normal Lower (legs, knees, ankles, toes): None, normal, Trunk Movements Neck, shoulders, hips: None, normal, Overall Severity Severity of abnormal movements (highest score from questions above): None, normal Incapacitation due to abnormal movements: None, normal Patient's awareness of abnormal movements (rate only patient's report): No Awareness, Dental Status Current problems with teeth and/or dentures?: No Does patient usually wear dentures?: No  CIWA:  CIWA-Ar Total: 2 COWS:  COWS Total Score: 2  Musculoskeletal: Strength & Muscle Tone: within normal limits Gait & Station: normal Patient leans: N/A  Psychiatric Specialty Exam: Physical Exam  Nursing note and vitals reviewed.   Review of Systems  All other systems reviewed and are negative.   Blood pressure (!) 150/95, pulse 72, temperature 98.9 F (37.2 C), temperature source Oral, resp. rate 16, height 6' (1.829 m), weight 57.2 kg (126 lb), SpO2 98 %.Body mass index is 17.09 kg/m.  General Appearance: Disheveled  Eye Contact:  Fair  Speech:  Slow  Volume:  Decreased  Mood:  Euthymic  Affect:  Congruent  Thought Process:  Coherent  Orientation:  Other:  SEe Above  Thought Content:  Logical, concrete   Suicidal Thoughts:  Yes.  without intent/plan  Homicidal Thoughts:  No  Memory:  Immediate;   Poor  Judgement:  Impaired  Insight:  Lacking  Psychomotor Activity:  Decreased  Concentration:  Concentration: Poor  Recall:  [employer] of Knowledge:  Fair  Language:  Fair  Akathisia:  No      Assets:  Resilience  ADL's:  Intact  Cognition:  Impaired,  Mild  Sleep:  Number of Hours: 6.45     Treatment Plan Summary: 62 yo male admitted due to agitation at group home. HE is being treated for UTI currently. HE is somewhat confused and not oriented but does not appear overall delirious. He likely has some mild cognitive impairment. He has been isolating to his room. He has been compliant with medications. Correct medication list sent from group home so will update medications.   Plan:  Schizophrenia -Received Gean Birchwood injection 156 mg in ED -Continue home Risperdal 1 mg qhs -Continue Lithium 450 mg BID. Lithium level was 0.67 -Continue Remeron 30 mg qhs -Restart Prozac 20 mg daily  HTN -HCTZ 12.5 mg  Urinary symptoms/UTI -Keflex 500 mg BID for 14 days -Finasteride -Flomax 0.4 mg daily  -Increase nasal spray to BID for trouble breathing through nose  CMP-no acute concerns, CBC with elevated WBC-likely due to UTI, Hg low but overall improved from past CBC, UA positive for UTI, TSH slightly elevated-will check T4 EKG reviewed QTc 423  Dispo -Pt has legal guardian. CSW spoke with her. He will return to group home when stable.    Haskell Riling, MD 11/07/2017, 3:31 PM

## 2017-11-07 NOTE — B[facility] Group Notes (Signed)
LCSW Group Therapy Note  11/07/2017 1:00 pm  Type of Therapy/Topic:  Group Therapy:  Balance in Life  Participation Level:  Did Not Attend  Description of Group:    This group will address the concept of balance and how it feels and looks when one is unbalanced. Patients will be encouraged to process areas in their lives that are out of balance and identify reasons for remaining unbalanced. Facilitators will guide patients in utilizing problem-solving interventions to address and correct the stressor making their life unbalanced. Understanding and applying boundaries will be explored and addressed for obtaining and maintaining a balanced life. Patients will be encouraged to explore ways to assertively make their unbalanced needs known to significant others in their lives, using other group members and facilitator for support and feedback.  Therapeutic Goals: 1. Patient will identify two or more emotions or situations they have that consume much of in their lives. 2. Patient will identify signs/triggers that life has become out of balance:  3. Patient will identify two ways to set boundaries in order to achieve balance in their lives:  4. Patient will demonstrate ability to communicate their needs through discussion and/or role plays  Summary of Patient Progress:  Ray Jackson was invited to group, but choose not to attend.    Therapeutic Modalities:   Cognitive Behavioral Therapy Solution-Focused Therapy Assertiveness Training  Alease FrameSonya S Baneza Bartoszek, [place] 11/07/2017 3:58 PM

## 2017-11-07 NOTE — BHH Counselor (Signed)
CSW received guardianship paperwork from sister [name] and will place the form in the patients file.  Johny Shearsassandra Leeam Cedrone, MSW, [place], LCASA 11/07/2017 11:55 AM

## 2017-11-07 NOTE — BHH Group Notes (Signed)
[facility] Group Notes:  (Nursing/MHT/Case Management/Adjunct)  Date:  11/07/2017  Time:  11:03 PM  Type of Therapy:  Grief Loss  Participation Level:  Did Not Attend  P Mayra NeerJackie L Alvino Lechuga 11/07/2017, 11:03 PM

## 2017-11-08 NOTE — B[facility] Group Notes (Signed)
LCSW Group Therapy Note  11/08/2017 9:30 AM  Type of Therapy and Topic:  Group Therapy:  Feelings around Relapse and Recovery  Participation Level:  Minimal   Description of Group:    Patients in this group will discuss emotions they experience before and after a relapse. They will process how experiencing these feelings, or avoidance of experiencing them, relates to having a relapse. Facilitator will guide patients to explore emotions they have related to recovery. Patients will be encouraged to process which emotions are more powerful. They will be guided to discuss the emotional reaction significant others in their lives may have to their relapse or recovery. Patients will be assisted in exploring ways to respond to the emotions of others without this contributing to a relapse.  Therapeutic Goals: 1. Patient will identify two or more emotions that lead to a relapse for them 2. Patient will identify two emotions that result when they relapse 3. Patient will identify two emotions related to recovery 4. Patient will demonstrate ability to communicate their needs through discussion and/or role plays   Summary of Patient Progress: Channing MuttersRoy was able to participate some in today's group. Channing MuttersRoy shared that he has experienced emotions of restlessness for being uneasy in his skin during times he has experienced relapses.  Channing MuttersRoy struggled to be able to highlight his emotions that are felt during periods of recovery for him.  He is struggling to be able to effective communicate his needs through discussion even with prompts from CSW.    Therapeutic Modalities:   Cognitive Behavioral Therapy Solution-Focused Therapy Assertiveness Training Relapse Prevention Therapy   Alease FrameSonya S Adalida Garver, LCSW 11/08/2017 2:27 PM

## 2017-11-08 NOTE — Plan of Care (Signed)
Pt. Verbalizes he can remain safe while on the unit. Pt. Denies SI/HI. Pt. Contracts for safety. Pt. Verbalizes understanding of education provided. Pt. Isolative and withdrawn this evening.   Problem: Activity: Goal: Interest or engagement in leisure activities will improve Outcome: Not Progressing   Problem: Health Behavior/Discharge Planning: Goal: Ability to remain free from injury will improve Outcome: Progressing   Problem: Education: Goal: Knowledge of [facility] Education information/materials will improve Outcome: Progressing

## 2017-11-08 NOTE — Progress Notes (Signed)
Patient is alert and oriented x 4, denies SI/HI/AVH.  Affect remains flat, but he brightens upon approach. Patient's thoughts are organized , behavior is appropriate and is visible on the unit. Patient does not engage in daily group sessions, is observed in bed majority of shift.  Patient is compliant with medication and meals no distress noted. Milieu remains safe.

## 2017-11-08 NOTE — Progress Notes (Signed)
D:Pt denies SI/HI/AVH. Pt is pleasant and cooperative, but spent the entire shift isolative and withdrawn to his room. Pt. has no Complaints this evening.  Patient Interaction is engaging with questions, but after proceeds to go back to bed. Pt. Presents disheveled with poor hygiene this evening. Pt. Encouraged to attend to ADLs.   A: Q x 15 minute observation checks were completed for safety. Patient was provided with education. Patient was given scheduled medications. Patient  was encourage to attend groups, participate in unit activities and continue with plan of care.   R:Patient is complaint with medication and unit procedures. Pt. Did not attend activities this evening.            Precautionary checks every 15 minutes for safety maintained, room free of safety hazards, patient sustains no injury or falls during this shift.

## 2017-11-08 NOTE — Progress Notes (Addendum)
[facility] MD Progress Note  11/08/2017 2:48 PM Ray Jackson  MRN:  [id number] Subjective:  Pt has been seen out of his room more today. He has been very calm and pleasant on the unit. He is overall organized in interview. However, not fully oriented. He likely has some cognitive impairment with some word finding difficulties. He has been drinking fluids and asking for Gatorade. He is eating well. He feels his bladder symptoms are improving. He denies SI or HI. Denies AH. He has not had any bizzare behaviors noted on the unit.   Principal Problem: Schizophrenia (HCC) Diagnosis:   Patient Active Problem List   Diagnosis Date Noted  . Schizophrenia (HCC) [F20.9] 11/05/2017    Priority: High  . UTI (urinary tract infection) [N39.0] 10/02/2017  . Anemia [D64.9] 10/02/2017   Total Time spent with patient: 20 minutes  Past Psychiatric History: See H&P  Past Medical History:  Past Medical History:  Diagnosis Date  . ARF (acute renal failure) (HCC) 10/02/2017  . Constipation   . Hypertension   . Hypokalemia   . Hyponatremia 10/02/2017  . Noncompliance 11/05/2017  . Schizophrenia [facility])     Past Surgical History:  Procedure Laterality Date  . TONSILLECTOMY     Family History: History reviewed. No pertinent family history. Family Psychiatric  History: See H&P Social History:  Social History   Substance and Sexual Activity  Alcohol Use No  . Frequency: Never     Social History   Substance and Sexual Activity  Drug Use No    Social History   Socioeconomic History  . Marital status: Single    Spouse name: Not on file  . Number of children: Not on file  . Years of education: Not on file  . Highest education level: Not on file  Occupational History  . Not on file  Social Needs  . Financial resource strain: Not on file  . Food insecurity:    Worry: Not on file    Inability: Not on file  . Transportation needs:    Medical: Not on file    Non-medical: Not on file  Tobacco Use  .  Smoking status: Current Every Day Smoker    Packs/day: 0.50  . Smokeless tobacco: Never Used  Substance and Sexual Activity  . Alcohol use: No    Frequency: Never  . Drug use: No  . Sexual activity: Not on file  Lifestyle  . Physical activity:    Days per week: Not on file    Minutes per session: Not on file  . Stress: Not on file  Relationships  . Social connections:    Talks on phone: Not on file    Gets together: Not on file    Attends religious service: Not on file    Active member of club or organization: Not on file    Attends meetings of clubs or organizations: Not on file    Relationship status: Not on file  Other Topics Concern  . Not on file  Social History Narrative  . Not on file   Additional Social History:                         Sleep: Good  Appetite:  Good  Current Medications: Current Facility-Administered Medications  Medication Dose Route Frequency Provider Last Rate Last Dose  . acetaminophen (TYLENOL) tablet 650 mg  650 mg Oral Q6H PRN Clapacs, Jackquline Denmark, MD      . alum &  mag hydroxide-simeth (MAALOX/MYLANTA) 200-200-20 MG/5ML suspension 30 mL  30 mL Oral Q4H PRN Clapacs, John T, MD      . cephALEXin (KEFLEX) capsule 500 mg  500 mg Oral Q12H Shannen Flansburg, Ileene HutchinsonHolly R, MD   500 mg at 11/08/17 0842  . cholecalciferol (VITAMIN D) tablet 1,000 Units  1,000 Units Oral Daily Clapacs, Jackquline DenmarkJohn T, MD   1,000 Units at 11/08/17 [phone]  . clonazePAM (KLONOPIN) tablet 1 mg  1 mg Oral QHS Clapacs, John T, MD   1 mg at 11/07/17 2200  . docusate sodium (COLACE) capsule 100 mg  100 mg Oral BID Clapacs, Jackquline DenmarkJohn T, MD   100 mg at 11/08/17 0842  . feeding supplement (ENSURE ENLIVE) (ENSURE ENLIVE) liquid 237 mL  237 mL Oral BID BM Keigo Whalley, Ileene HutchinsonHolly R, MD   237 mL at 11/08/17 1342  . finasteride (PROSCAR) tablet 5 mg  5 mg Oral Daily Clapacs, Jackquline DenmarkJohn T, MD   5 mg at 11/08/17 0842  . FLUoxetine (PROZAC) capsule 20 mg  20 mg Oral Daily York Valliant, Ileene HutchinsonHolly R, MD   20 mg at 11/08/17 [phone]  . fluticasone  (FLONASE) 50 MCG/ACT nasal spray 2 spray  2 spray Each Nare BID Haskell RilingMcNew, Arsalan Brisbin R, MD   2 spray at 11/08/17 0839  . hydrochlorothiazide (HYDRODIURIL) tablet 25 mg  25 mg Oral Daily Laticia Vannostrand, Ileene HutchinsonHolly R, MD   25 mg at 11/08/17 [phone]  . hydrOXYzine (ATARAX/VISTARIL) tablet 25 mg  25 mg Oral TID PRN Clapacs, Jackquline DenmarkJohn T, MD   25 mg at 11/07/17 2306  . lactulose (CHRONULAC) 10 GM/15ML solution 20 g  20 g Oral BID Clapacs, Jackquline DenmarkJohn T, MD   20 g at 11/08/17 0841  . lithium carbonate (ESKALITH) CR tablet 450 mg  450 mg Oral Q12H Clapacs, Jackquline DenmarkJohn T, MD   450 mg at 11/08/17 0842  . loratadine (CLARITIN) tablet 10 mg  10 mg Oral Daily Laritza Vokes, Ileene HutchinsonHolly R, MD   10 mg at 11/08/17 0842  . magnesium hydroxide (MILK OF MAGNESIA) suspension 30 mL  30 mL Oral Daily PRN Clapacs, John T, MD      . mirtazapine (REMERON) tablet 30 mg  30 mg Oral QHS Clapacs, John T, MD   30 mg at 11/07/17 2200  . polyethylene glycol (MIRALAX / GLYCOLAX) packet 17 g  17 g Oral Daily Clapacs, Jackquline DenmarkJohn T, MD   17 g at 11/08/17 0840  . risperiDONE (RISPERDAL M-TABS) disintegrating tablet 1 mg  1 mg Oral QHS Clapacs, John T, MD   1 mg at 11/07/17 2200  . tamsulosin (FLOMAX) capsule 0.4 mg  0.4 mg Oral Daily Clapacs, Jackquline DenmarkJohn T, MD   0.4 mg at 11/08/17 0842  . tiotropium (SPIRIVA) inhalation capsule 18 mcg  18 mcg Inhalation Daily Haskell RilingMcNew, Jaquesha Boroff R, MD   18 mcg at 11/08/17 [phone]  . traZODone (DESYREL) tablet 100 mg  100 mg Oral QHS PRN Haskell RilingMcNew, Lonzy Mato R, MD   100 mg at 11/07/17 2306  . vitamin B-12 (CYANOCOBALAMIN) tablet 1,000 mcg  1,000 mcg Oral Daily Clapacs, Jackquline DenmarkJohn T, MD   1,000 mcg at 11/08/17 [phone]    Lab Results: No results found for this or any previous visit (from the past 48 hour(s)).  Blood Alcohol level:  Lab Results  Component Value Date   ETH <10 11/05/2017   ETH <10 10/02/2017    Metabolic Disorder Labs: Lab Results  Component Value Date   HGBA1C 4.5 (L) 11/06/2017   MPG 82.45 11/06/2017   No results found for:  PROLACTIN Lab Results  Component Value Date    CHOL 137 11/06/2017   TRIG 73 11/06/2017   HDL 49 11/06/2017   CHOLHDL 2.8 11/06/2017   VLDL 15 11/06/2017   LDLCALC 73 11/06/2017   LDLCALC 47 04/06/2014    Physical Findings: AIMS: Facial and Oral Movements Muscles of Facial Expression: None, normal Lips and Perioral Area: None, normal Jaw: None, normal Tongue: None, normal,Extremity Movements Upper (arms, wrists, hands, fingers): None, normal Lower (legs, knees, ankles, toes): None, normal, Trunk Movements Neck, shoulders, hips: None, normal, Overall Severity Severity of abnormal movements (highest score from questions above): None, normal Incapacitation due to abnormal movements: None, normal Patient's awareness of abnormal movements (rate only patient's report): No Awareness, Dental Status Current problems with teeth and/or dentures?: No Does patient usually wear dentures?: No  CIWA:  CIWA-Ar Total: 2 COWS:  COWS Total Score: 2  Musculoskeletal: Strength & Muscle Tone: within normal limits Gait & Station: normal Patient leans: N/A  Psychiatric Specialty Exam: Physical Exam  Nursing note and vitals reviewed.   Review of Systems  All other systems reviewed and are negative.   Blood pressure (!) 154/90, pulse 77, temperature 98.8 F (37.1 C), temperature source Oral, resp. rate 16, height 6' (1.829 m), weight 57.2 kg (126 lb), SpO2 98 %.Body mass index is 17.09 kg/m.  General Appearance: Disheveled, very thin  Eye Contact:  Fair  Speech:  Slow  Volume:  Decreased  Mood:  Euthymic  Affect:  Appropriate  Thought Process:  Concrete, slow  Orientation:  Other:  "May 1990", [place] to [place] and [place]  Thought Content:  Logical  Suicidal Thoughts:  No  Homicidal Thoughts:  No  Memory:  Immediate;   Poor  Judgement:  Impaired  Insight:  Lacking  Psychomotor Activity:  Normal  Concentration:  Concentration: Poor  Recall:  Poor  Fund of Knowledge:  Fair  Language:  Fair  Akathisia:  No      Assets:  Resilience   ADL's:  Intact  Cognition:  Impaired,  Mild  Sleep:  Number of Hours: 8     Treatment Plan Summary: 62 yo male admitted due to agitation at group home. HE is being treated for UTI currently. HE is somewhat confused and not oriented but does not appear overall delirious. He likely has some mild cognitive impairment. He has been isolating to his room but out on the unit more often today. HE is eating okay. Will check urine culture to ensure keflex is sensitive.  Plan:  Schizophrenia -Received Gean Birchwood injection 156 mg in ED -Continue home Risperdal 1 mg qhs -Continue Lithium 450 mg BID. Lithium level was 0.67 -Continue Remeron 30 mg qhs -Continue Prozac 20 mg daily  HTN -HCTZ 12.5 mg  Urinary symptoms/UTI -Keflex 500 mg BID for 14 days -Will check Urine Culture -Finasteride -Flomax 0.4 mg daily  Malnutrition -Ensure BID   CMP-no acute concerns, CBC with elevated WBC-likely due to UTI, Hg low but overall improved from past CBC, UA positive for UTI, TSH slightly elevated-will check T4 EKG reviewed QTc 423  Check CMP, CBC, lithium level over the weekend  Dispo -Pt has legal guardian. CSW spoke with her. He will return to group home when stable.      Haskell Riling, MD 11/08/2017, 2:48 PM

## 2017-11-08 NOTE — Progress Notes (Signed)
Patient is alert and oriented x 4, denies pain or discomfort, affect is flat, but he brightens upon approach. Patient's  thoughts are organized , behavior is appropriate and is visible on the unit.  Patient was offered encouragement and support, Patient is  compliant with medication no distress noted, 15 minutes safety checks monitord will continue to closely monitor

## 2017-11-08 NOTE — Plan of Care (Signed)
Patient verbalizing understanding of  information received   Some  evidence  of use of coping skills  observed . No safety concerns voiced . Some anxiety noted.   

## 2017-11-09 DIAGNOSIS — F209 Schizophrenia, unspecified: Secondary | ICD-10-CM

## 2017-11-09 LAB — COMPREHENSIVE METABOLIC PANEL
ALBUMIN: 2.9 g/dL — AB (ref 3.5–5.0)
ALT: 18 U/L (ref 17–63)
ANION GAP: 7 (ref 5–15)
AST: 16 U/L (ref 15–41)
Alkaline Phosphatase: 60 U/L (ref 38–126)
BUN: 26 mg/dL — AB (ref 6–20)
CHLORIDE: 104 mmol/L (ref 101–111)
CO2: 31 mmol/L (ref 22–32)
Calcium: 8.9 mg/dL (ref 8.9–10.3)
Creatinine, Ser: 1.02 mg/dL (ref 0.61–1.24)
GFR calc Af Amer: >60 mL/min (ref >60)
GFR calc non Af Amer: >60 mL/min (ref >60)
GLUCOSE: 92 mg/dL (ref 65–99)
Potassium: 3.9 mmol/L (ref 3.5–5.1)
Sodium: 142 mmol/L (ref 135–145)
TOTAL PROTEIN: 6.3 g/dL — AB (ref 6.5–8.1)
Total Bilirubin: 0.5 mg/dL (ref 0.3–1.2)

## 2017-11-09 LAB — CBC
HEMATOCRIT: 39.1 % — AB (ref 40.0–52.0)
HEMOGLOBIN: 12.9 g/dL — AB (ref 13.0–18.0)
MCH: 32.8 pg (ref 26.0–34.0)
MCHC: 33.1 g/dL (ref 32.0–36.0)
MCV: 99.1 fL (ref 80.0–100.0)
Platelets: 401 10*3/uL (ref 150–440)
RBC: 3.95 MIL/uL — ABNORMAL LOW (ref 4.40–5.90)
RDW: 15.6 % — AB (ref 11.5–14.5)
WBC: 7.9 10*3/uL (ref 3.8–10.6)

## 2017-11-09 LAB — LITHIUM LEVEL: Lithium Lvl: 0.6 mmol/L (ref 0.60–1.20)

## 2017-11-09 LAB — URINE CULTURE
Culture: NO GROWTH
Special Requests: NORMAL

## 2017-11-09 NOTE — Plan of Care (Signed)
Patient up ad lib with a steady gait, presents with a sad/flat affect. Remains isolative to his room. Patient sleeps for extended hours during the day.  Denies SI/HI/AVH, patient encouraged to shower today. Will continue to monitor patient with his hygiene. Patient has not had any complaints thus far this shift. Compliant with his medications and meals, milieu remains safe with q 15 checks.

## 2017-11-09 NOTE — [facility] Group Notes (Signed)
LCSW Group Therapy Note  11/09/2017 1:15pm  Type of Therapy and Topic:  Group Therapy:  Healthy Self Image and Positive Change  Participation Level:  Active   Description of Group:  In this group, patients will compare and contrast their current "I am...." statements to the visions they identify as desirable for their lives.  Patients discuss fears and how they can make positive changes in their cognitions that will positively impact their behaviors.  Facilitator played a motivational 3-minute speech and patients were left with the task of thinking about what "I am...." statements they can start using in their lives immediately.  Therapeutic Goals: 1. Patient will state their current self-perception as expressed in an "I Am" statement 2. Patient will contrast this with their desired vision for their live 3. Patient will identify 3 fears that negatively impact their behavior 4. Patient will discuss cognitive distortions that stem from their fears 5. Patient will verbalize statements that challenge their cognitive distortions  Summary of Patient Progress:  The patient stated he feels restless. He reported that it has to do with not being able to smoke cigarettes. He shared with the group his current self-perception as "I am a good at being a brother". Patient discussed fears and how they can make positive changes in his cognitions that will positively impact his behaviors. Pt shared his cognitive distortions and verbalized challenges that stem from these distortions. Pt fully engaged in group discussion.      Therapeutic Modalities Cognitive Behavioral Therapy Motivational Interviewing  Johnnye SimaNNIA  CUEBAS-COLON, LCSW 11/09/2017 12:23 PM

## 2017-11-09 NOTE — Progress Notes (Addendum)
[facility] MD Progress Note  11/09/2017 8:52 PM Ray Jackson  MRN:  [id number] Subjective:  Pt walking around the unit today like he is always in a hurry. Going in and out of room several times. Had very little interest me, I introduced myself, he walked away.  Denies si/hi/avh to nursing  Principal Problem: Schizophrenia [facility]) Diagnosis:   Patient Active Problem List   Diagnosis Date Noted  . Schizophrenia ([place]) [F20.9] 11/05/2017  . UTI (urinary tract infection) [N39.0] 10/02/2017  . Anemia [D64.9] 10/02/2017   Total Time spent with patient: 20 minutes  Past Psychiatric History: See H&P  Past Medical History:  Past Medical History:  Diagnosis Date  . ARF (acute renal failure) ([place]) 10/02/2017  . Constipation   . Hypertension   . Hypokalemia   . Hyponatremia 10/02/2017  . Noncompliance 11/05/2017  . Schizophrenia [facility])     Past Surgical History:  Procedure Laterality Date  . TONSILLECTOMY     Family History: History reviewed. No pertinent family history. Family Psychiatric  History: See H&P Social History:  Social History   Substance and Sexual Activity  Alcohol Use No  . Frequency: Never     Social History   Substance and Sexual Activity  Drug Use No    Social History   Socioeconomic History  . Marital status: Single    Spouse name: Not on file  . Number of children: Not on file  . Years of education: Not on file  . Highest education level: Not on file  Occupational History  . Not on file  Social Needs  . Financial resource strain: Not on file  . Food insecurity:    Worry: Not on file    Inability: Not on file  . Transportation needs:    Medical: Not on file    Non-medical: Not on file  Tobacco Use  . Smoking status: Current Every Day Smoker    Packs/day: 0.50  . Smokeless tobacco: Never Used  Substance and Sexual Activity  . Alcohol use: No    Frequency: Never  . Drug use: No  . Sexual activity: Not on file  Lifestyle  . Physical activity:    Days  per week: Not on file    Minutes per session: Not on file  . Stress: Not on file  Relationships  . Social connections:    Talks on phone: Not on file    Gets together: Not on file    Attends religious service: Not on file    Active member of club or organization: Not on file    Attends meetings of clubs or organizations: Not on file    Relationship status: Not on file  Other Topics Concern  . Not on file  Social History Narrative  . Not on file   Additional Social History:                         Sleep: Good  Appetite:  Good  Current Medications: Current Facility-Administered Medications  Medication Dose Route Frequency Provider Last Rate Last Dose  . acetaminophen (TYLENOL) tablet 650 mg  650 mg Oral Q6H PRN Clapacs, John T, MD      . alum & mag hydroxide-simeth (MAALOX/MYLANTA) 200-200-20 MG/5ML suspension 30 mL  30 mL Oral Q4H PRN Clapacs, John T, MD      . cephALEXin (KEFLEX) capsule 500 mg  500 mg Oral Q12H McNew, Tyson Babinski, MD   500 mg at 11/09/17 0846  .  cholecalciferol (VITAMIN D) tablet 1,000 Units  1,000 Units Oral Daily Clapacs, Madie Reno, MD   1,000 Units at 11/09/17 [phone]  . clonazePAM (KLONOPIN) tablet 1 mg  1 mg Oral QHS Clapacs, Madie Reno, MD   1 mg at 11/08/17 2132  . docusate sodium (COLACE) capsule 100 mg  100 mg Oral BID Clapacs, Madie Reno, MD   100 mg at 11/09/17 1723  . feeding supplement (ENSURE ENLIVE) (ENSURE ENLIVE) liquid 237 mL  237 mL Oral BID BM McNew, Holly R, MD   237 mL at 11/09/17 1331  . finasteride (PROSCAR) tablet 5 mg  5 mg Oral Daily Clapacs, Madie Reno, MD   5 mg at 11/09/17 0846  . FLUoxetine (PROZAC) capsule 20 mg  20 mg Oral Daily McNew, Tyson Babinski, MD   20 mg at 11/09/17 0846  . fluticasone (FLONASE) 50 MCG/ACT nasal spray 2 spray  2 spray Each Nare BID Marylin Crosby, MD   2 spray at 11/09/17 0844  . hydrochlorothiazide (HYDRODIURIL) tablet 25 mg  25 mg Oral Daily McNew, Tyson Babinski, MD   25 mg at 11/09/17 0846  . hydrOXYzine (ATARAX/VISTARIL)  tablet 25 mg  25 mg Oral TID PRN Clapacs, Madie Reno, MD   25 mg at 11/07/17 2306  . lactulose (CHRONULAC) 10 GM/15ML solution 20 g  20 g Oral BID Clapacs, Madie Reno, MD   20 g at 11/09/17 1723  . lithium carbonate (ESKALITH) CR tablet 450 mg  450 mg Oral Q12H Clapacs, Madie Reno, MD   450 mg at 11/09/17 0846  . loratadine (CLARITIN) tablet 10 mg  10 mg Oral Daily McNew, Tyson Babinski, MD   10 mg at 11/09/17 0846  . magnesium hydroxide (MILK OF MAGNESIA) suspension 30 mL  30 mL Oral Daily PRN Clapacs, John T, MD      . mirtazapine (REMERON) tablet 30 mg  30 mg Oral QHS Clapacs, Madie Reno, MD   30 mg at 11/08/17 2132  . polyethylene glycol (MIRALAX / GLYCOLAX) packet 17 g  17 g Oral Daily Clapacs, Madie Reno, MD   17 g at 11/08/17 0840  . risperiDONE (RISPERDAL M-TABS) disintegrating tablet 1 mg  1 mg Oral QHS Clapacs, John T, MD   1 mg at 11/08/17 2131  . tamsulosin (FLOMAX) capsule 0.4 mg  0.4 mg Oral Daily Clapacs, Madie Reno, MD   0.4 mg at 11/09/17 0846  . tiotropium (SPIRIVA) inhalation capsule 18 mcg  18 mcg Inhalation Daily Marylin Crosby, MD   18 mcg at 11/09/17 0844  . traZODone (DESYREL) tablet 100 mg  100 mg Oral QHS PRN Marylin Crosby, MD   100 mg at 11/07/17 2306  . vitamin B-12 (CYANOCOBALAMIN) tablet 1,000 mcg  1,000 mcg Oral Daily Clapacs, Madie Reno, MD   1,000 mcg at 11/09/17 6553    Lab Results:  Results for orders placed or performed during the hospital encounter of 11/05/17 (from the past 48 hour(s))  Urine Culture     Status: None   Collection Time: 11/08/17  1:05 PM  Result Value Ref Range   Specimen Description      URINE, RANDOM Performed at [facility], [address] [place], [place] [address]    Special Requests      Normal Performed at [facility], [address], [place], [place] [address]    Culture      NO GROWTH Performed at [facility], [place] [address], [place],  [address]  Report Status 11/09/2017 FINAL   Comprehensive metabolic panel      Status: Abnormal   Collection Time: 11/09/17  7:00 AM  Result Value Ref Range   Sodium 142 135 - 145 mmol/L   Potassium 3.9 3.5 - 5.1 mmol/L   Chloride 104 101 - 111 mmol/L   CO2 31 22 - 32 mmol/L   Glucose, Bld 92 65 - 99 mg/dL   BUN 26 (H) 6 - 20 mg/dL   Creatinine, Ser 1.02 0.61 - 1.24 mg/dL   Calcium 8.9 8.9 - 10.3 mg/dL   Total Protein 6.3 (L) 6.5 - 8.1 g/dL   Albumin 2.9 (L) 3.5 - 5.0 g/dL   AST 16 15 - 41 U/L   ALT 18 17 - 63 U/L   Alkaline Phosphatase 60 38 - 126 U/L   Total Bilirubin 0.5 0.3 - 1.2 mg/dL   GFR calc non Af Amer >60 >60 mL/min   GFR calc Af Amer >60 >60 mL/min    Comment: (NOTE) The eGFR has been calculated using the CKD EPI equation. This calculation has not been validated in all clinical situations. eGFR's persistently <60 mL/min signify possible Chronic Kidney Disease.    Anion gap 7 5 - 15    Comment: Performed at [facility], [address] [place], [place] [address]  CBC     Status: Abnormal   Collection Time: 11/09/17  7:00 AM  Result Value Ref Range   WBC 7.9 3.8 - 10.6 K/uL   RBC 3.95 (L) 4.40 - 5.90 MIL/uL   Hemoglobin 12.9 (L) 13.0 - 18.0 g/dL   HCT 39.1 (L) 40.0 - 52.0 %   MCV 99.1 80.0 - 100.0 fL   MCH 32.8 26.0 - 34.0 pg   MCHC 33.1 32.0 - 36.0 g/dL   RDW 15.6 (H) 11.5 - 14.5 %   Platelets 401 150 - 440 K/uL    Comment: Performed at [facility], [address] [place], [place] [address]  Lithium level     Status: None   Collection Time: 11/09/17  7:00 AM  Result Value Ref Range   Lithium Lvl 0.60 0.60 - 1.20 mmol/L    Comment: Performed at [facility], [address] [place], [place] [address]    Blood Alcohol level:  Lab Results  Component Value Date   [facility] <10 11/05/2017   ETH <10 02/72/5366    Metabolic Disorder Labs: Lab Results  Component Value Date   HGBA1C 4.5 (L) 11/06/2017   MPG 82.45 11/06/2017   No results found for: PROLACTIN Lab Results  Component Value Date   CHOL 137  11/06/2017   TRIG 73 11/06/2017   HDL 49 11/06/2017   CHOLHDL 2.8 11/06/2017   VLDL 15 11/06/2017   LDLCALC 73 11/06/2017   LDLCALC 47 04/06/2014    Physical Findings: AIMS: Facial and Oral Movements Muscles of Facial Expression: None, normal Lips and Perioral Area: None, normal Jaw: None, normal Tongue: None, normal,Extremity Movements Upper (arms, wrists, hands, fingers): None, normal Lower (legs, knees, ankles, toes): None, normal, Trunk Movements Neck, shoulders, hips: None, normal, Overall Severity Severity of abnormal movements (highest score from questions above): None, normal Incapacitation due to abnormal movements: None, normal Patient's awareness of abnormal movements (rate only patient's report): No Awareness, Dental Status Current problems with teeth and/or dentures?: No Does patient usually wear dentures?: No  CIWA:  CIWA-Ar Total: 2 COWS:  COWS Total Score: 2  Musculoskeletal: Strength & Muscle Tone: within normal limits Gait &  Station: normal Patient leans: N/A  Psychiatric Specialty Exam: Physical Exam  Nursing note and vitals reviewed.   Review of Systems  All other systems reviewed and are negative.   Blood pressure 127/70, pulse 74, temperature 99.1 F (37.3 C), temperature source Oral, resp. rate 16, height 6' (1.829 m), weight 57.2 kg (126 lb), SpO2 96 %.Body mass index is 17.09 kg/m.  General Appearance: Disheveled, very thin  Eye Contact:  Fair  Speech:  Slow  Volume:  Decreased  Mood:  Irritable  Affect:  Appropriate  Thought Process:  [place], slow  Orientation:  Other:  "May 1990", [place] to [place] and [place]  Thought Content:  Logical  Suicidal Thoughts:  No  Homicidal Thoughts:  No  Memory:  Immediate;   Poor  Judgement:  Impaired  Insight:  Lacking  Psychomotor Activity:  Normal  Concentration:  Concentration: Poor  Recall:  Poor  Fund of Knowledge:  Fair  Language:  Fair  Akathisia:  No      Assets:  Resilience  ADL's:   Intact  Cognition:  Impaired,  Mild  Sleep:  Number of Hours: 8.3     Treatment Plan Summary: 62 yo male admitted due to agitation at group home. HE is being treated for UTI currently. HE is somewhat confused and not oriented but does not appear overall delirious. He likely has some mild cognitive impairment.He is walking around a lot.  HE is eating okay.  Plan:  Schizophrenia -Received Kirt Boys injection 156 mg in ED -Continue home Risperdal 1 mg qhs -Continue Lithium 450 mg BID. Lithium level was 0.60 on 3/23 -Continue Remeron 30 mg qhs -Continue Prozac 20 mg daily  HTN -HCTZ 12.5 mg  Urinary symptoms/UTI -Keflex 500 mg BID for 14 days -his urine culture has no growth  -Finasteride -Flomax 0.4 mg daily  Malnutrition -Ensure BID    UA positive for UTI, TSH slightly elevated-will check T4 - WNL EKG reviewed QTc 423  Leukocytosis has improved.  Protein and albumin still low.  He also has an elevated BUN. Will cotinue to follow    Dispo -Pt has legal guardian. CSW spoke with her. He will return to group home when stable.      Jolene Schimke, MD 11/09/2017, 8:52 PM

## 2017-11-09 NOTE — [facility] Group Notes (Signed)
[facility] Group Notes:  (Nursing/MHT/Case Management/Adjunct)  Date:  11/09/2017  Time:  2:48 PM  Type of Therapy:  Psychoeducational Skills  Participation Level:  Did Not Attend   Foy GuadalajaraJasmine R Zayvien Canning 11/09/2017, 2:48 PM

## 2017-11-09 NOTE — Plan of Care (Signed)
Patient is encouraged to do ADLs and bath himself, patient contract for safety of self and others, mood and affect remain anxious and depressed, patient is aware of coping skills and able to identify positive attributes of self and voice no concerns, denies SI/HI and no signs of AVH, 15 minutes rounding is maintained. Problem: Education: Goal: Ability to incorporate positive changes in behavior to improve self-esteem will improve Outcome: Progressing   Problem: Health Behavior/Discharge Planning: Goal: Ability to remain free from injury will improve Outcome: Progressing   Problem: Self-Concept: Goal: Ability to verbalize positive feelings about self will improve Outcome: Progressing   Problem: Coping: Goal: Ability to identify and develop effective coping behavior will improve Outcome: Progressing Goal: Ability to interact with others will improve Outcome: Progressing   Problem: Activity: Goal: Interest or engagement in leisure activities will improve Outcome: Progressing   Problem: Education: Goal: Mental status will improve Outcome: Progressing   Problem: Clinical Measurements: Goal: Will remain free from infection Outcome: Progressing Goal: Respiratory complications will improve Outcome: Progressing   Problem: Skin Integrity: Goal: Risk for impaired skin integrity will decrease Outcome: Progressing   Problem: Activity: Goal: Sleeping patterns will improve Outcome: Progressing

## 2017-11-10 NOTE — Progress Notes (Signed)
[facility] MD Progress Note  11/10/2017 7:48 PM Ray Jackson.  MRN:  [id number] Subjective:  Pt laying in bed this morning.  He was incontinent overnight.  He states it happens sometimes, he is a bit upset about it.  Later he gets up and starts walking around the unit like he in a hurry.  He denies si/hi/avh.  Denies si/hi/avh to nursing  Principal Problem: Schizophrenia [facility]) Diagnosis:   Patient Active Problem List   Diagnosis Date Noted  . Schizophrenia ([place]) [F20.9] 11/05/2017  . UTI (urinary tract infection) [N39.0] 10/02/2017  . Anemia [D64.9] 10/02/2017   Total Time spent with patient: 20 minutes  Past Psychiatric History: See H&P  Past Medical History:  Past Medical History:  Diagnosis Date  . ARF (acute renal failure) ([place]) 10/02/2017  . Constipation   . Hypertension   . Hypokalemia   . Hyponatremia 10/02/2017  . Noncompliance 11/05/2017  . Schizophrenia [facility])     Past Surgical History:  Procedure Laterality Date  . TONSILLECTOMY     Family History: History reviewed. No pertinent family history. Family Psychiatric  History: See H&P Social History:  Social History   Substance and Sexual Activity  Alcohol Use No  . Frequency: Never     Social History   Substance and Sexual Activity  Drug Use No    Social History   Socioeconomic History  . Marital status: Single    Spouse name: Not on file  . Number of children: Not on file  . Years of education: Not on file  . Highest education level: Not on file  Occupational History  . Not on file  Social Needs  . Financial resource strain: Not on file  . Food insecurity:    Worry: Not on file    Inability: Not on file  . Transportation needs:    Medical: Not on file    Non-medical: Not on file  Tobacco Use  . Smoking status: Current Every Day Smoker    Packs/day: 0.50  . Smokeless tobacco: Never Used  Substance and Sexual Activity  . Alcohol use: No    Frequency: Never  . Drug use: No  . Sexual  activity: Not on file  Lifestyle  . Physical activity:    Days per week: Not on file    Minutes per session: Not on file  . Stress: Not on file  Relationships  . Social connections:    Talks on phone: Not on file    Gets together: Not on file    Attends religious service: Not on file    Active member of club or organization: Not on file    Attends meetings of clubs or organizations: Not on file    Relationship status: Not on file  Other Topics Concern  . Not on file  Social History Narrative  . Not on file   Additional Social History:                         Sleep: Good  Appetite:  Good  Current Medications: Current Facility-Administered Medications  Medication Dose Route Frequency Provider Last Rate Last Dose  . acetaminophen (TYLENOL) tablet 650 mg  650 mg Oral Q6H PRN Clapacs, John T, MD      . alum & mag hydroxide-simeth (MAALOX/MYLANTA) 200-200-20 MG/5ML suspension 30 mL  30 mL Oral Q4H PRN Clapacs, John T, MD      . cephALEXin (KEFLEX) capsule 500 mg  500 mg Oral  Q12H McNew, Tyson Babinski, MD   500 mg at 11/10/17 0817  . cholecalciferol (VITAMIN D) tablet 1,000 Units  1,000 Units Oral Daily Clapacs, Madie Reno, MD   1,000 Units at 11/10/17 [phone]  . clonazePAM (KLONOPIN) tablet 1 mg  1 mg Oral QHS Clapacs, Madie Reno, MD   1 mg at 11/09/17 2121  . docusate sodium (COLACE) capsule 100 mg  100 mg Oral BID Clapacs, Madie Reno, MD   100 mg at 11/10/17 1633  . feeding supplement (ENSURE ENLIVE) (ENSURE ENLIVE) liquid 237 mL  237 mL Oral BID BM McNew, Holly R, MD   237 mL at 11/10/17 1340  . finasteride (PROSCAR) tablet 5 mg  5 mg Oral Daily Clapacs, Madie Reno, MD   5 mg at 11/10/17 0817  . FLUoxetine (PROZAC) capsule 20 mg  20 mg Oral Daily McNew, Tyson Babinski, MD   20 mg at 11/10/17 0816  . fluticasone (FLONASE) 50 MCG/ACT nasal spray 2 spray  2 spray Each Nare BID McNew, Tyson Babinski, MD   2 spray at 11/10/17 0813  . hydrochlorothiazide (HYDRODIURIL) tablet 25 mg  25 mg Oral Daily McNew, Tyson Babinski,  MD   25 mg at 11/10/17 0816  . hydrOXYzine (ATARAX/VISTARIL) tablet 25 mg  25 mg Oral TID PRN Clapacs, Madie Reno, MD   25 mg at 11/07/17 2306  . lactulose (CHRONULAC) 10 GM/15ML solution 20 g  20 g Oral BID Clapacs, Madie Reno, MD   20 g at 11/10/17 1633  . lithium carbonate (ESKALITH) CR tablet 450 mg  450 mg Oral Q12H Clapacs, Madie Reno, MD   450 mg at 11/10/17 0816  . loratadine (CLARITIN) tablet 10 mg  10 mg Oral Daily McNew, Tyson Babinski, MD   10 mg at 11/10/17 0816  . magnesium hydroxide (MILK OF MAGNESIA) suspension 30 mL  30 mL Oral Daily PRN Clapacs, John T, MD      . mirtazapine (REMERON) tablet 30 mg  30 mg Oral QHS Clapacs, Madie Reno, MD   30 mg at 11/09/17 2120  . polyethylene glycol (MIRALAX / GLYCOLAX) packet 17 g  17 g Oral Daily Clapacs, Madie Reno, MD   17 g at 11/10/17 6803  . risperiDONE (RISPERDAL M-TABS) disintegrating tablet 1 mg  1 mg Oral QHS Clapacs, John T, MD   1 mg at 11/09/17 2121  . tamsulosin (FLOMAX) capsule 0.4 mg  0.4 mg Oral Daily Clapacs, Madie Reno, MD   0.4 mg at 11/10/17 0816  . tiotropium (SPIRIVA) inhalation capsule 18 mcg  18 mcg Inhalation Daily Marylin Crosby, MD   18 mcg at 11/10/17 0815  . traZODone (DESYREL) tablet 100 mg  100 mg Oral QHS PRN Marylin Crosby, MD   100 mg at 11/07/17 2306  . vitamin B-12 (CYANOCOBALAMIN) tablet 1,000 mcg  1,000 mcg Oral Daily Clapacs, Madie Reno, MD   1,000 mcg at 11/10/17 2122    Lab Results:  Results for orders placed or performed during the hospital encounter of 11/05/17 (from the past 48 hour(s))  Comprehensive metabolic panel     Status: Abnormal   Collection Time: 11/09/17  7:00 AM  Result Value Ref Range   Sodium 142 135 - 145 mmol/L   Potassium 3.9 3.5 - 5.1 mmol/L   Chloride 104 101 - 111 mmol/L   CO2 31 22 - 32 mmol/L   Glucose, Bld 92 65 - 99 mg/dL   BUN 26 (H) 6 - 20 mg/dL   Creatinine, Ser 1.02 0.61 -  1.24 mg/dL   Calcium 8.9 8.9 - 10.3 mg/dL   Total Protein 6.3 (L) 6.5 - 8.1 g/dL   Albumin 2.9 (L) 3.5 - 5.0 g/dL   AST 16 15  - 41 U/L   ALT 18 17 - 63 U/L   Alkaline Phosphatase 60 38 - 126 U/L   Total Bilirubin 0.5 0.3 - 1.2 mg/dL   GFR calc non Af Amer >60 >60 mL/min   GFR calc Af Amer >60 >60 mL/min    Comment: (NOTE) The eGFR has been calculated using the CKD EPI equation. This calculation has not been validated in all clinical situations. eGFR's persistently <60 mL/min signify possible Chronic Kidney Disease.    Anion gap 7 5 - 15    Comment: Performed at [facility], [address] [place], [place] [address]  CBC     Status: Abnormal   Collection Time: 11/09/17  7:00 AM  Result Value Ref Range   WBC 7.9 3.8 - 10.6 K/uL   RBC 3.95 (L) 4.40 - 5.90 MIL/uL   Hemoglobin 12.9 (L) 13.0 - 18.0 g/dL   HCT 39.1 (L) 40.0 - 52.0 %   MCV 99.1 80.0 - 100.0 fL   MCH 32.8 26.0 - 34.0 pg   MCHC 33.1 32.0 - 36.0 g/dL   RDW 15.6 (H) 11.5 - 14.5 %   Platelets 401 150 - 440 K/uL    Comment: Performed at [facility], [address] [place], [place] [address]  Lithium level     Status: None   Collection Time: 11/09/17  7:00 AM  Result Value Ref Range   Lithium Lvl 0.60 0.60 - 1.20 mmol/L    Comment: Performed at [facility], [address] [place], [place] [address]    Blood Alcohol level:  Lab Results  Component Value Date   [facility] <10 11/05/2017   ETH <10 26/33/3545    Metabolic Disorder Labs: Lab Results  Component Value Date   HGBA1C 4.5 (L) 11/06/2017   MPG 82.45 11/06/2017   No results found for: PROLACTIN Lab Results  Component Value Date   CHOL 137 11/06/2017   TRIG 73 11/06/2017   HDL 49 11/06/2017   CHOLHDL 2.8 11/06/2017   VLDL 15 11/06/2017   LDLCALC 73 11/06/2017   LDLCALC 47 04/06/2014    Physical Findings: AIMS: Facial and Oral Movements Muscles of Facial Expression: None, normal Lips and Perioral Area: None, normal Jaw: None, normal Tongue: None, normal,Extremity Movements Upper (arms, wrists, hands, fingers): None, normal Lower (legs, knees,  ankles, toes): None, normal, Trunk Movements Neck, shoulders, hips: None, normal, Overall Severity Severity of abnormal movements (highest score from questions above): None, normal Incapacitation due to abnormal movements: None, normal Patient's awareness of abnormal movements (rate only patient's report): No Awareness, Dental Status Current problems with teeth and/or dentures?: No Does patient usually wear dentures?: No  CIWA:  CIWA-Ar Total: 2 COWS:  COWS Total Score: 2  Musculoskeletal: Strength & Muscle Tone: within normal limits Gait & Station: normal Patient leans: N/A  Psychiatric Specialty Exam: Physical Exam  Nursing note and vitals reviewed.   Review of Systems  All other systems reviewed and are negative.   Blood pressure (!) 151/82, pulse 78, temperature 98.6 F (37 C), temperature source Oral, resp. rate 16, height 6' (1.829 m), weight 57.2 kg (126 lb), SpO2 97 %.Body mass index is 17.09 kg/m.  General Appearance: Disheveled, very thin  Eye Contact:  Fair  Speech:  Slow  Volume:  Decreased  Mood:  Anxious  Affect:  Appropriate  Thought Process:  [place], slow  Orientation:  Other:  "May 1990", [place]  Thought Content:  Logical  Suicidal Thoughts:  No  Homicidal Thoughts:  No  Memory:  Immediate;   Poor  Judgement:  Impaired  Insight:  Lacking  Psychomotor Activity:  Normal  Concentration:  Concentration: Poor  Recall:  Poor  Fund of Knowledge:  Fair  Language:  Fair  Akathisia:  No      Assets:  Resilience  ADL's:  Intact  Cognition:  Impaired,  Mild  Sleep:  Number of Hours: 6.15     Treatment Plan Summary: 62 yo male admitted due to agitation at group home. HE is being treated for UTI currently. HE is somewhat confused and not oriented but does not appear overall delirious. He likely has some mild cognitive impairment.He is walking around a lot.  HE is eating okay.   Plan:  Schizophrenia -Received Kirt Boys injection  156 mg in ED -Continue home Risperdal 1 mg qhs -Continue Lithium 450 mg BID. Lithium level was 0.60 on 3/23 -Continue Remeron 30 mg qhs -Continue Prozac 20 mg daily  HTN -HCTZ 12.5 mg  Urinary symptoms/UTI -Keflex 500 mg BID for 14 days -his urine culture has no growth  -Finasteride -Flomax 0.4 mg daily  Malnutrition -Ensure BID    UA positive for UTI, TSH slightly elevated-will check T4 - WNL EKG reviewed QTc 423    Dispo -Pt has legal guardian. CSW spoke with her. He will return to group home when stable.      Jolene Schimke, MD 11/10/2017, 7:48 PM

## 2017-11-10 NOTE — [facility] Group Notes (Signed)
LCSW Group Therapy Note 11/10/2017 1:15pm  Type of Therapy and Topic: Group Therapy: Feelings Around Returning Home & Establishing a Supportive Framework and Supporting Oneself When Supports Not Available  Participation Level: Minimal  Description of Group:  Patients first processed thoughts and feelings about upcoming discharge. These included fears of upcoming changes, lack of change, new living environments, judgements and expectations from others and overall stigma of mental health issues. The group then discussed the definition of a supportive framework, what that looks and feels like, and how do to discern it from an unhealthy non-supportive network. The group identified different types of supports as well as what to do when your family/friends are less than helpful or unavailable  Therapeutic Goals  1. Patient will identify one healthy supportive network that they can use at discharge. 2. Patient will identify one factor of a supportive framework and how to tell it from an unhealthy network. 3. Patient able to identify one coping skill to use when they do not have positive supports from others. 4. Patient will demonstrate ability to communicate their needs through discussion and/or role plays.  Summary of Patient Progress:  Pt reported he feels sleepy.  As patients processed their anxiety about discharge and described healthy supports patient stated "I don't think I will ever be ready to leave the hospital. Maybe because of the medicines, lack of inspiration, or experience" pt reported that he has a hard time sleeping and getting his ideal weight.  Patients identified at least one self-care tool they were willing to use after discharge.   Therapeutic Modalities Cognitive Behavioral Therapy Motivational Interviewing   Ray Jackson  CUEBAS-COLON, LCSW 11/10/2017 12:29 PM

## 2017-11-10 NOTE — Plan of Care (Signed)
Pleasant and cooperative. Active in the milieu and compliant with medications

## 2017-11-10 NOTE — Plan of Care (Signed)
Patient up ad lib with a steady gait, presents with sad/flat affect. Remains isolative to his room. Patient sleeps for extended hours during the day.  Denies SI/HI/AVH, patient encouraged to shower today, patient states, "I will at least comb my hair." Patient has not had any complaints thus far this shift. Compliant with his medications and meals, milieu remains safe with q 15 checks.

## 2017-11-10 NOTE — Progress Notes (Signed)
Patient was visible in the milieu, pleasant and cooperative. Presents with poorr hygiene and was encouraged to shower: patient stated he will shower in AM. Patient was active in the milieu and compliant with unit expectations. Presented to the medication room after group and snack. Received medications and ensure. Did not have any concern but stated that "now I am ready to go home". Patient went to bed and currently resting. Staff continue to monitor for safety and other possible needs.

## 2017-11-10 NOTE — [facility] Group Notes (Signed)
[facility] Group Notes:  (Nursing/MHT/Case Management/Adjunct)  Date:  11/10/2017  Time:  9:19 PM  Type of Therapy:  Psychoeducational Skills  Participation Level:  Minimal  Participation Quality:  Appropriate  Affect:  Appropriate  Cognitive:  Alert  Insight:  Good  Engagement in Group:  Limited  Modes of Intervention:  Discussion, Socialization and Support  Summary of Progress/Problems:  Wilson SingerJustin  Holli Rengel 11/10/2017, 9:19 PM

## 2017-11-11 NOTE — Progress Notes (Signed)
[facility] MD Progress Note  11/11/2017 12:46 PM Ray Jackson.  MRN:  [id number]   Subjective:  Pt has been calm and pleasant on the unit. He has had very poor hygien however did shower this afternoon and is much less malodorous. He states that he is doing well. Affect is brighter today and he is joking with this provider. He is more oriented today but with some word finding difficulty. HE is eating well and also drinking Gatorade and ensure which he likes. HE denies SI or thoughts of self harm. Denies AH. He has been compliant with medications. He has been isolative to his room but he states that he is trying to get out more.   Principal Problem: Schizophrenia (HCC) Diagnosis:   Patient Active Problem List   Diagnosis Date Noted  . Schizophrenia (HCC) [F20.9] 11/05/2017    Priority: High  . UTI (urinary tract infection) [N39.0] 10/02/2017  . Anemia [D64.9] 10/02/2017   Total Time spent with patient: 15 minutes  Past Psychiatric History: See H&P  Past Medical History:  Past Medical History:  Diagnosis Date  . ARF (acute renal failure) (HCC) 10/02/2017  . Constipation   . Hypertension   . Hypokalemia   . Hyponatremia 10/02/2017  . Noncompliance 11/05/2017  . Schizophrenia [facility])     Past Surgical History:  Procedure Laterality Date  . TONSILLECTOMY     Family History: History reviewed. No pertinent family history. Family Psychiatric  History: See H&P Social History:  Social History   Substance and Sexual Activity  Alcohol Use No  . Frequency: Never     Social History   Substance and Sexual Activity  Drug Use No    Social History   Socioeconomic History  . Marital status: Single    Spouse name: Not on file  . Number of children: Not on file  . Years of education: Not on file  . Highest education level: Not on file  Occupational History  . Not on file  Social Needs  . Financial resource strain: Not on file  . Food insecurity:    Worry: Not on file    Inability:  Not on file  . Transportation needs:    Medical: Not on file    Non-medical: Not on file  Tobacco Use  . Smoking status: Current Every Day Smoker    Packs/day: 0.50  . Smokeless tobacco: Never Used  Substance and Sexual Activity  . Alcohol use: No    Frequency: Never  . Drug use: No  . Sexual activity: Not on file  Lifestyle  . Physical activity:    Days per week: Not on file    Minutes per session: Not on file  . Stress: Not on file  Relationships  . Social connections:    Talks on phone: Not on file    Gets together: Not on file    Attends religious service: Not on file    Active member of club or organization: Not on file    Attends meetings of clubs or organizations: Not on file    Relationship status: Not on file  Other Topics Concern  . Not on file  Social History Narrative  . Not on file   Additional Social History:                         Sleep: Good  Appetite:  Fair  Current Medications: Current Facility-Administered Medications  Medication Dose Route Frequency Provider Last Rate  Last Dose  . acetaminophen (TYLENOL) tablet 650 mg  650 mg Oral Q6H PRN Clapacs, John T, MD      . alum & mag hydroxide-simeth (MAALOX/MYLANTA) 200-200-20 MG/5ML suspension 30 mL  30 mL Oral Q4H PRN Clapacs, John T, MD      . cephALEXin (KEFLEX) capsule 500 mg  500 mg Oral Q12H Manas Hickling, Ileene HutchinsonHolly R, MD   500 mg at 11/11/17 [id number]  . cholecalciferol (VITAMIN D) tablet 1,000 Units  1,000 Units Oral Daily Clapacs, Jackquline DenmarkJohn T, MD   1,000 Units at 11/11/17 [phone]  . clonazePAM (KLONOPIN) tablet 1 mg  1 mg Oral QHS Clapacs, Jackquline DenmarkJohn T, MD   1 mg at 11/10/17 2157  . docusate sodium (COLACE) capsule 100 mg  100 mg Oral BID Clapacs, Jackquline DenmarkJohn T, MD   100 mg at 11/11/17 0808  . feeding supplement (ENSURE ENLIVE) (ENSURE ENLIVE) liquid 237 mL  237 mL Oral BID BM Kalee Mcclenathan, Ileene HutchinsonHolly R, MD   237 mL at 11/11/17 1035  . finasteride (PROSCAR) tablet 5 mg  5 mg Oral Daily Clapacs, Jackquline DenmarkJohn T, MD   5 mg at 11/11/17 0809  .  FLUoxetine (PROZAC) capsule 20 mg  20 mg Oral Daily Mahlik Lenn, Ileene HutchinsonHolly R, MD   20 mg at 11/11/17 0809  . fluticasone (FLONASE) 50 MCG/ACT nasal spray 2 spray  2 spray Each Nare BID Haskell RilingMcNew, Anjeanette Petzold R, MD   2 spray at 11/11/17 0810  . hydrochlorothiazide (HYDRODIURIL) tablet 25 mg  25 mg Oral Daily Nechelle Petrizzo, Ileene HutchinsonHolly R, MD   25 mg at 11/11/17 [id number]  . hydrOXYzine (ATARAX/VISTARIL) tablet 25 mg  25 mg Oral TID PRN Clapacs, Jackquline DenmarkJohn T, MD   25 mg at 11/07/17 2306  . lactulose (CHRONULAC) 10 GM/15ML solution 20 g  20 g Oral BID Clapacs, Jackquline DenmarkJohn T, MD   20 g at 11/10/17 1633  . lithium carbonate (ESKALITH) CR tablet 450 mg  450 mg Oral Q12H Clapacs, Jackquline DenmarkJohn T, MD   450 mg at 11/11/17 0809  . loratadine (CLARITIN) tablet 10 mg  10 mg Oral Daily Gaylin Bulthuis, Ileene HutchinsonHolly R, MD   10 mg at 11/11/17 [phone]  . magnesium hydroxide (MILK OF MAGNESIA) suspension 30 mL  30 mL Oral Daily PRN Clapacs, John T, MD      . mirtazapine (REMERON) tablet 30 mg  30 mg Oral QHS Clapacs, Jackquline DenmarkJohn T, MD   30 mg at 11/10/17 2157  . polyethylene glycol (MIRALAX / GLYCOLAX) packet 17 g  17 g Oral Daily Clapacs, Jackquline DenmarkJohn T, MD   17 g at 11/10/17 [id number]  . risperiDONE (RISPERDAL M-TABS) disintegrating tablet 1 mg  1 mg Oral QHS Clapacs, John T, MD   1 mg at 11/10/17 2157  . tamsulosin (FLOMAX) capsule 0.4 mg  0.4 mg Oral Daily Clapacs, John T, MD   0.4 mg at 11/11/17 0809  . tiotropium (SPIRIVA) inhalation capsule 18 mcg  18 mcg Inhalation Daily Haskell RilingMcNew, Quince Santana R, MD   18 mcg at 11/11/17 0810  . traZODone (DESYREL) tablet 100 mg  100 mg Oral QHS PRN Haskell RilingMcNew, Nickolis Diel R, MD   100 mg at 11/07/17 2306  . vitamin B-12 (CYANOCOBALAMIN) tablet 1,000 mcg  1,000 mcg Oral Daily Clapacs, Jackquline DenmarkJohn T, MD   1,000 mcg at 11/11/17 0808    Lab Results: No results found for this or any previous visit (from the past 48 hour(s)).  Blood Alcohol level:  Lab Results  Component Value Date   ETH <10 11/05/2017   ETH <10 10/02/2017  Metabolic Disorder Labs: Lab Results  Component Value Date   HGBA1C 4.5  (L) 11/06/2017   MPG 82.45 11/06/2017   No results found for: PROLACTIN Lab Results  Component Value Date   CHOL 137 11/06/2017   TRIG 73 11/06/2017   HDL 49 11/06/2017   CHOLHDL 2.8 11/06/2017   VLDL 15 11/06/2017   LDLCALC 73 11/06/2017   LDLCALC 47 04/06/2014    Physical Findings: AIMS: Facial and Oral Movements Muscles of Facial Expression: None, normal Lips and Perioral Area: None, normal Jaw: None, normal Tongue: None, normal,Extremity Movements Upper (arms, wrists, hands, fingers): None, normal Lower (legs, knees, ankles, toes): None, normal, Trunk Movements Neck, shoulders, hips: None, normal, Overall Severity Severity of abnormal movements (highest score from questions above): None, normal Incapacitation due to abnormal movements: None, normal Patient's awareness of abnormal movements (rate only patient's report): No Awareness, Dental Status Current problems with teeth and/or dentures?: No Does patient usually wear dentures?: No  CIWA:  CIWA-Ar Total: 2 COWS:  COWS Total Score: 2  Musculoskeletal: Strength & Muscle Tone: flaccid Gait & Station: normal Patient leans: N/A  Psychiatric Specialty Exam: Physical Exam  Nursing note and vitals reviewed.   Review of Systems  All other systems reviewed and are negative.   Blood pressure (!) 148/98, pulse 86, temperature 98.6 F (37 C), temperature source Oral, resp. rate 16, height 6' (1.829 m), weight 57.2 kg (126 lb), SpO2 97 %.Body mass index is 17.09 kg/m.  General Appearance: Casual, showered this afternoon, better hygiene  Eye Contact:  Fair  Speech:  Clear and Coherent  Volume:  Normal  Mood:  Euthymic  Affect:  Appropriate  Thought Process:  Coherent and Goal Directed  Orientation:  Full (Time, Place, and Person)  Thought Content:  Logical  Suicidal Thoughts:  No  Homicidal Thoughts:  No  Memory:  Immediate;   Poor  Judgement:  Fair  Insight:  Fair  Psychomotor Activity:  Normal  Concentration:   Concentration: Poor  Recall:  Poor  Fund of Knowledge:  Fair  Language:  Fair  Akathisia:  No      Assets:  Resilience  ADL's:  Intact  Cognition:  WNL  Sleep:  Number of Hours: 6.45     Treatment Plan Summary: 62 yo male admitted due to agitation. He is being treated for UTI. Cognition is improving and he is much more oriented today. Hygiene is improved today. He is eating better and drinking Ensure.   Plan:  Schizophrenia -Received Gean Birchwood injection 156 mg in ED -Continuehome Risperdal 1 mg qhs -Continue Lithium450 mg BID. Lithium level was 0.60 on 3/23 -ContinueRemeron 30 mg qhs -Continue Prozac 20 mg daily  HTN -HCTZ 12.5 mg  Urinary symptoms/UTI -Keflex 500 mg BID for 14 days -his urine culture has no growth  -Finasteride -Flomax 0.4 mg daily  Malnutrition -Ensure BID  Dispo -Pt has legal guardian. CSW spoke with her. He will return to group home when stable.      Haskell Riling, MD 11/11/2017, 12:46 PM

## 2017-11-11 NOTE — Plan of Care (Addendum)
Patient found in bed upon my arrival. Patient is neither visible nor social this evening. States, "I finally got some peace of mind and then you ruined it." Patient says that my coming into the room to introduce myself cost him his peace of mind. Patient is isolative and irritable throughout the evening. After having a snack, irritability was slightly less. Denies AVH/HI/SI. Denies pain. Mood and affect remain depressed, irritable, and anxious. Given Trazodone for sleep. Will monitor for efficacy. Compliant with HS medications and staff direction. Q 15 minute checks maintained. Will continue to monitor throughout the shift.  Patient slept 7 hours. No apparent distress. Will endorse care to oncoming shift.  Problem: Education: Goal: Knowledge of [facility] Education information/materials will improve Outcome: Progressing Goal: Verbalization of understanding the information provided will improve Outcome: Progressing   Problem: Nutrition: Goal: Adequate nutrition will be maintained Outcome: Progressing   Problem: Elimination: Goal: Will not experience complications related to bowel motility Outcome: Progressing Goal: Will not experience complications related to urinary retention Outcome: Progressing   Problem: Pain Managment: Goal: General experience of comfort will improve Outcome: Progressing

## 2017-11-11 NOTE — Tx Team (Addendum)
Interdisciplinary Treatment and Diagnostic Plan Update  11/11/2017 Time of Session: 10:35AM Lynnell Chad [facility]. MRN: [id number]  Principal Diagnosis: Schizophrenia [facility])  Secondary Diagnoses: Principal Problem:   Schizophrenia (HCC) Active Problems:   UTI (urinary tract infection)   Current Medications:  Current Facility-Administered Medications  Medication Dose Route Frequency Provider Last Rate Last Dose  . acetaminophen (TYLENOL) tablet 650 mg  650 mg Oral Q6H PRN Clapacs, John T, MD      . alum & mag hydroxide-simeth (MAALOX/MYLANTA) 200-200-20 MG/5ML suspension 30 mL  30 mL Oral Q4H PRN Clapacs, John T, MD      . cephALEXin (KEFLEX) capsule 500 mg  500 mg Oral Q12H McNew, Ileene Hutchinson, MD   500 mg at 11/11/17 1324  . cholecalciferol (VITAMIN D) tablet 1,000 Units  1,000 Units Oral Daily Clapacs, Jackquline Denmark, MD   1,000 Units at 11/11/17 [phone]  . clonazePAM (KLONOPIN) tablet 1 mg  1 mg Oral QHS Clapacs, Jackquline Denmark, MD   1 mg at 11/10/17 2157  . docusate sodium (COLACE) capsule 100 mg  100 mg Oral BID Clapacs, Jackquline Denmark, MD   100 mg at 11/11/17 0808  . feeding supplement (ENSURE ENLIVE) (ENSURE ENLIVE) liquid 237 mL  237 mL Oral BID BM McNew, Ileene Hutchinson, MD   237 mL at 11/11/17 1403  . finasteride (PROSCAR) tablet 5 mg  5 mg Oral Daily Clapacs, Jackquline Denmark, MD   5 mg at 11/11/17 0809  . FLUoxetine (PROZAC) capsule 20 mg  20 mg Oral Daily McNew, Ileene Hutchinson, MD   20 mg at 11/11/17 0809  . fluticasone (FLONASE) 50 MCG/ACT nasal spray 2 spray  2 spray Each Nare BID Haskell Riling, MD   2 spray at 11/11/17 0810  . hydrochlorothiazide (HYDRODIURIL) tablet 25 mg  25 mg Oral Daily McNew, Ileene Hutchinson, MD   25 mg at 11/11/17 2725  . hydrOXYzine (ATARAX/VISTARIL) tablet 25 mg  25 mg Oral TID PRN Clapacs, Jackquline Denmark, MD   25 mg at 11/07/17 2306  . lactulose (CHRONULAC) 10 GM/15ML solution 20 g  20 g Oral BID Clapacs, Jackquline Denmark, MD   20 g at 11/10/17 1633  . lithium carbonate (ESKALITH) CR tablet 450 mg  450 mg Oral Q12H Clapacs, Jackquline Denmark, MD   450 mg at 11/11/17 0809  . loratadine (CLARITIN) tablet 10 mg  10 mg Oral Daily McNew, Ileene Hutchinson, MD   10 mg at 11/11/17 3664  . magnesium hydroxide (MILK OF MAGNESIA) suspension 30 mL  30 mL Oral Daily PRN Clapacs, John T, MD      . mirtazapine (REMERON) tablet 30 mg  30 mg Oral QHS Clapacs, Jackquline Denmark, MD   30 mg at 11/10/17 2157  . polyethylene glycol (MIRALAX / GLYCOLAX) packet 17 g  17 g Oral Daily Clapacs, Jackquline Denmark, MD   17 g at 11/10/17 4034  . risperiDONE (RISPERDAL M-TABS) disintegrating tablet 1 mg  1 mg Oral QHS Clapacs, John T, MD   1 mg at 11/10/17 2157  . tamsulosin (FLOMAX) capsule 0.4 mg  0.4 mg Oral Daily Clapacs, John T, MD   0.4 mg at 11/11/17 0809  . tiotropium (SPIRIVA) inhalation capsule 18 mcg  18 mcg Inhalation Daily Haskell Riling, MD   18 mcg at 11/11/17 0810  . traZODone (DESYREL) tablet 100 mg  100 mg Oral QHS PRN Haskell Riling, MD   100 mg at 11/07/17 2306  . vitamin B-12 (CYANOCOBALAMIN) tablet 1,000 mcg  1,000  mcg Oral Daily Clapacs, Jackquline Denmark, MD   1,000 mcg at 11/11/17 0808   PTA Medications: Medications Prior to Admission  Medication Sig Dispense Refill Last Dose  . cetirizine (ZYRTEC) 10 MG tablet Take 10 mg by mouth at bedtime.    10/01/2017 at Unknown time  . Cholecalciferol (VITAMIN D3) 2000 units capsule Take 2,000 Units by mouth daily.   10/02/2017 at Unknown time  . docusate sodium (COLACE) 100 MG capsule Take 100 mg by mouth 2 (two) times daily.   10/02/2017 at Unknown time  . finasteride (PROSCAR) 5 MG tablet Take 5 mg by mouth daily.   10/02/2017 at Unknown time  . fluticasone (FLONASE) 50 MCG/ACT nasal spray Place 2 sprays into both nostrils daily.   10/02/2017 at Unknown time  . hydrochlorothiazide (HYDRODIURIL) 25 MG tablet Take 0.5 tablets (12.5 mg total) by mouth daily.     Marland Kitchen lactulose (CHRONULAC) 10 GM/15ML solution Take 20 g by mouth 2 (two) times daily.    10/02/2017 at Unknown time  . mirtazapine (REMERON) 30 MG tablet Take 30 mg by mouth at bedtime.    10/01/2017 at Unknown time  . paliperidone (INVEGA SUSTENNA) 156 MG/ML SUSP injection Inject 156 mg into the muscle every 30 (thirty) days.   09/29/2017 at Unknown time  . polyethylene glycol (MIRALAX / GLYCOLAX) packet Take 17 g by mouth daily.   10/02/2017 at Unknown time  . tamsulosin (FLOMAX) 0.4 MG CAPS capsule Take 1 capsule by mouth daily.   10/02/2017 at Unknown time  . traZODone (DESYREL) 100 MG tablet Take 200 mg by mouth at bedtime.   10/01/2017 at Unknown time  . vitamin B-12 (CYANOCOBALAMIN) 1000 MCG tablet Take 1,000 mcg by mouth daily.   10/02/2017 at Unknown time    Patient Stressors: Financial difficulties Health problems Medication change or noncompliance Occupational concerns  Patient Strengths: Ability for insight Capable of independent living Motivation for treatment/growth Supportive family/friends  Treatment Modalities: Medication Management, Group therapy, Case management,  1 to 1 session with clinician, Psychoeducation, Recreational therapy.   Physician Treatment Plan for Primary Diagnosis: Schizophrenia (HCC) Long Term Goal(s): Improvement in symptoms so as ready for discharge   Short Term Goals: Ability to demonstrate self-control will improve  Medication Management: Evaluate patient's response, side effects, and tolerance of medication regimen.  Therapeutic Interventions: 1 to 1 sessions, Unit Group sessions and Medication administration.  Evaluation of Outcomes: Progressing  Physician Treatment Plan for Secondary Diagnosis: Principal Problem:   Schizophrenia (HCC) Active Problems:   UTI (urinary tract infection)  Long Term Goal(s): Improvement in symptoms so as ready for discharge   Short Term Goals: Ability to demonstrate self-control will improve     Medication Management: Evaluate patient's response, side effects, and tolerance of medication regimen.  Therapeutic Interventions: 1 to 1 sessions, Unit Group sessions and Medication  administration.  Evaluation of Outcomes: Progressing   RN Treatment Plan for Primary Diagnosis: Schizophrenia (HCC) Long Term Goal(s): Knowledge of disease and therapeutic regimen to maintain health will improve  Short Term Goals: Ability to verbalize feelings will improve, Ability to identify and develop effective coping behaviors will improve and Compliance with prescribed medications will improve  Medication Management: RN will administer medications as ordered by provider, will assess and evaluate patient's response and provide education to patient for prescribed medication. RN will report any adverse and/or side effects to prescribing provider.  Therapeutic Interventions: 1 on 1 counseling sessions, Psychoeducation, Medication administration, Evaluate responses to treatment, Monitor vital signs and CBGs as  ordered, Perform/monitor CIWA, COWS, AIMS and Fall Risk screenings as ordered, Perform wound care treatments as ordered.  Evaluation of Outcomes: Progressing   LCSW Treatment Plan for Primary Diagnosis: Schizophrenia (HCC) Long Term Goal(s): Safe transition to appropriate next level of care at discharge, Engage patient in therapeutic group addressing interpersonal concerns.  Short Term Goals: Engage patient in aftercare planning with referrals and resources and Increase skills for wellness and recovery  Therapeutic Interventions: Assess for all discharge needs, 1 to 1 time with Social worker, Explore available resources and support systems, Assess for adequacy in community support network, Educate family and significant other(s) on suicide prevention, Complete Psychosocial Assessment, Interpersonal group therapy.  Evaluation of Outcomes: Progressing   Progress in Treatment: Attending groups: Yes. Participating in groups: Yes. Taking medication as prescribed: Yes. Toleration medication: Yes. Family/Significant other contact made: Yes, individual(s) contacted:  [name],  Guardian/sister, [phone]  Patient understands diagnosis: Yes. Discussing patient identified problems/goals with staff: Yes. Medical problems stabilized or resolved: Yes. Denies suicidal/homicidal ideation: Yes. Issues/concerns per patient self-inventory: No. Other:   New problem(s) identified: No, Describe:  None  New Short Term/Long Term Goal(s): "To get over this UTI."  Discharge Plan or Barriers: To return back to his group home and continue to work with his ACTT Team.   Reason for Continuation of Hospitalization: Medical Issues Medication stabilization  Estimated Length of Stay: 2-3 days Attendees: Patient: Joline MaxcyRoy Dena 11/11/2017 4:58 PM  Physician: Corinna GabHolly McNew, MD 11/11/2017 4:58 PM  Nursing:  11/11/2017 4:58 PM  RN Care Manager: 11/11/2017 4:58 PM  Social Worker: Jake SharkSara Jadie Comas, LCSW 11/11/2017 4:58 PM  Recreational Therapist:  11/11/2017 4:58 PM  Other: Heidi DachKelsey Craig, LCSW 11/11/2017 4:58 PM  Other:  11/11/2017 4:58 PM  Other: 11/11/2017 4:58 PM    Scribe for Treatment Team: Glennon MacSara P Meryn Sarracino, LCSW 11/11/2017 4:58 PM

## 2017-11-11 NOTE — Progress Notes (Signed)
Handoff report received from RN. Will continue to monitor pt. For safety for remainder of nightshift.  

## 2017-11-11 NOTE — Progress Notes (Signed)
Precautionary checks every 15 minutes for safety maintained, room free of safety hazards, patient sustains no injury or falls during this shift. 

## 2017-11-11 NOTE — Plan of Care (Signed)
Patient is calm and cooperative in the unit.Denies SI,HI and AVH.Patient verbalized that he is feeling better.Attended some groups.Appetite and energy level good.Support and encouragement given.

## 2017-11-12 NOTE — [facility] Group Notes (Signed)
  11/12/2017  Time: 0900  Type of Therapy and Topic: Group Therapy: Goals Group: SMART Goals   Participation Level:  Did Not Attend   Description of Group:   The purpose of a daily goals group is to assist and guide patients in setting recovery/wellness-related goals. The objective is to set goals as they relate to the crisis in which they were admitted. Patients will be using SMART goal modalities to set measurable goals. Characteristics of realistic goals will be discussed and patients will be assisted in setting and processing how one will reach their goal. Facilitator will also assist patients in applying interventions and coping skills learned in psycho-education groups to the SMART goal and process how one will achieve defined goal.   Therapeutic Goals:  -Patients will develop and document one goal related to or their crisis in which brought them into treatment.  -Patients will be guided by LCSW using SMART goal setting modality in how to set a measurable, attainable, realistic and time sensitive goal.  -Patients will process barriers in reaching goal.  -Patients will process interventions in how to overcome and successful in reaching goal.   Patient's Goal:  Pt was invited to attend group but chose not to attend. CSW will continue to encourage pt to attend group throughout their admission.   Therapeutic Modalities:  Motivational Interviewing  Cognitive Behavioral Therapy  Crisis Intervention Model  SMART goals setting   Zakhia Seres, MSW, LCSW 11/12/2017 9:34 AM  

## 2017-11-12 NOTE — Progress Notes (Signed)
[facility] MD Progress Note  11/12/2017 2:33 PM Ray Jackson.  MRN:  [id number]   Subjective:  Pt was incontinent today and is embarrased about it. He states that this does happen sometimes. HE states that overall he is feeling okay. Mood is good. HE is trying to get out of his room more and he is observed socializing with other peers in the morning. HE is eating better. He denies SI or HI. Denies AH. He is only oriented to presedient and city and state. Not oriented to month or year "April 1990" Principal Problem: Schizophrenia [facility]) Diagnosis:   Patient Active Problem List   Diagnosis Date Noted  . Schizophrenia (HCC) [F20.9] 11/05/2017    Priority: High  . UTI (urinary tract infection) [N39.0] 10/02/2017  . Anemia [D64.9] 10/02/2017   Total Time spent with patient: 15 minutes  Past Psychiatric History: See H&P  Past Medical History:  Past Medical History:  Diagnosis Date  . ARF (acute renal failure) (HCC) 10/02/2017  . Constipation   . Hypertension   . Hypokalemia   . Hyponatremia 10/02/2017  . Noncompliance 11/05/2017  . Schizophrenia [facility])     Past Surgical History:  Procedure Laterality Date  . TONSILLECTOMY     Family History: History reviewed. No pertinent family history. Family Psychiatric  History: See H&P Social History:  Social History   Substance and Sexual Activity  Alcohol Use No  . Frequency: Never     Social History   Substance and Sexual Activity  Drug Use No    Social History   Socioeconomic History  . Marital status: Single    Spouse name: Not on file  . Number of children: Not on file  . Years of education: Not on file  . Highest education level: Not on file  Occupational History  . Not on file  Social Needs  . Financial resource strain: Not on file  . Food insecurity:    Worry: Not on file    Inability: Not on file  . Transportation needs:    Medical: Not on file    Non-medical: Not on file  Tobacco Use  . Smoking status: Current  Every Day Smoker    Packs/day: 0.50  . Smokeless tobacco: Never Used  Substance and Sexual Activity  . Alcohol use: No    Frequency: Never  . Drug use: No  . Sexual activity: Not on file  Lifestyle  . Physical activity:    Days per week: Not on file    Minutes per session: Not on file  . Stress: Not on file  Relationships  . Social connections:    Talks on phone: Not on file    Gets together: Not on file    Attends religious service: Not on file    Active member of club or organization: Not on file    Attends meetings of clubs or organizations: Not on file    Relationship status: Not on file  Other Topics Concern  . Not on file  Social History Narrative  . Not on file   Additional Social History:                         Sleep: Good  Appetite:  Good  Current Medications: Current Facility-Administered Medications  Medication Dose Route Frequency Provider Last Rate Last Dose  . acetaminophen (TYLENOL) tablet 650 mg  650 mg Oral Q6H PRN Clapacs, Jackquline Denmark, MD      . alum &  mag hydroxide-simeth (MAALOX/MYLANTA) 200-200-20 MG/5ML suspension 30 mL  30 mL Oral Q4H PRN Clapacs, John T, MD      . cephALEXin (KEFLEX) capsule 500 mg  500 mg Oral Q12H McNew, Ileene Hutchinson, MD   500 mg at 11/12/17 0913  . cholecalciferol (VITAMIN D) tablet 1,000 Units  1,000 Units Oral Daily Clapacs, Jackquline Denmark, MD   1,000 Units at 11/12/17 0909  . clonazePAM (KLONOPIN) tablet 1 mg  1 mg Oral QHS Clapacs, Jackquline Denmark, MD   1 mg at 11/11/17 2129  . docusate sodium (COLACE) capsule 100 mg  100 mg Oral BID Clapacs, Jackquline Denmark, MD   100 mg at 11/12/17 0912  . feeding supplement (ENSURE ENLIVE) (ENSURE ENLIVE) liquid 237 mL  237 mL Oral BID BM McNew, Ileene Hutchinson, MD   237 mL at 11/11/17 1403  . finasteride (PROSCAR) tablet 5 mg  5 mg Oral Daily Clapacs, Jackquline Denmark, MD   5 mg at 11/12/17 0913  . FLUoxetine (PROZAC) capsule 20 mg  20 mg Oral Daily McNew, Ileene Hutchinson, MD   20 mg at 11/12/17 0909  . fluticasone (FLONASE) 50 MCG/ACT  nasal spray 2 spray  2 spray Each Nare BID Haskell Riling, MD   2 spray at 11/12/17 0910  . hydrochlorothiazide (HYDRODIURIL) tablet 25 mg  25 mg Oral Daily McNew, Ileene Hutchinson, MD   25 mg at 11/12/17 0909  . hydrOXYzine (ATARAX/VISTARIL) tablet 25 mg  25 mg Oral TID PRN Clapacs, Jackquline Denmark, MD   25 mg at 11/07/17 2306  . lactulose (CHRONULAC) 10 GM/15ML solution 20 g  20 g Oral BID Clapacs, Jackquline Denmark, MD   20 g at 11/12/17 0908  . lithium carbonate (ESKALITH) CR tablet 450 mg  450 mg Oral Q12H Clapacs, John T, MD   450 mg at 11/12/17 0910  . loratadine (CLARITIN) tablet 10 mg  10 mg Oral Daily McNew, Ileene Hutchinson, MD   10 mg at 11/12/17 0911  . magnesium hydroxide (MILK OF MAGNESIA) suspension 30 mL  30 mL Oral Daily PRN Clapacs, John T, MD      . mirtazapine (REMERON) tablet 30 mg  30 mg Oral QHS Clapacs, Jackquline Denmark, MD   30 mg at 11/11/17 2129  . polyethylene glycol (MIRALAX / GLYCOLAX) packet 17 g  17 g Oral Daily Clapacs, Jackquline Denmark, MD   17 g at 11/12/17 0911  . risperiDONE (RISPERDAL M-TABS) disintegrating tablet 1 mg  1 mg Oral QHS Clapacs, John T, MD   1 mg at 11/11/17 2129  . tamsulosin (FLOMAX) capsule 0.4 mg  0.4 mg Oral Daily Clapacs, Jackquline Denmark, MD   0.4 mg at 11/12/17 0909  . tiotropium (SPIRIVA) inhalation capsule 18 mcg  18 mcg Inhalation Daily Haskell Riling, MD   18 mcg at 11/11/17 0810  . traZODone (DESYREL) tablet 100 mg  100 mg Oral QHS PRN Haskell Riling, MD   100 mg at 11/11/17 2129  . vitamin B-12 (CYANOCOBALAMIN) tablet 1,000 mcg  1,000 mcg Oral Daily Clapacs, Jackquline Denmark, MD   1,000 mcg at 11/12/17 0909    Lab Results: No results found for this or any previous visit (from the past 48 hour(s)).  Blood Alcohol level:  Lab Results  Component Value Date   ETH <10 11/05/2017   ETH <10 10/02/2017    Metabolic Disorder Labs: Lab Results  Component Value Date   HGBA1C 4.5 (L) 11/06/2017   MPG 82.45 11/06/2017   No results found for:  PROLACTIN Lab Results  Component Value Date   CHOL 137 11/06/2017    TRIG 73 11/06/2017   HDL 49 11/06/2017   CHOLHDL 2.8 11/06/2017   VLDL 15 11/06/2017   LDLCALC 73 11/06/2017   LDLCALC 47 04/06/2014    Physical Findings: AIMS: Facial and Oral Movements Muscles of Facial Expression: None, normal Lips and Perioral Area: None, normal Jaw: None, normal Tongue: None, normal,Extremity Movements Upper (arms, wrists, hands, fingers): None, normal Lower (legs, knees, ankles, toes): None, normal, Trunk Movements Neck, shoulders, hips: None, normal, Overall Severity Severity of abnormal movements (highest score from questions above): None, normal Incapacitation due to abnormal movements: None, normal Patient's awareness of abnormal movements (rate only patient's report): No Awareness, Dental Status Current problems with teeth and/or dentures?: No Does patient usually wear dentures?: No  CIWA:  CIWA-Ar Total: 2 COWS:  COWS Total Score: 2  Musculoskeletal: Strength & Muscle Tone: within normal limits Gait & Station: normal Patient leans: N/A  Psychiatric Specialty Exam: Physical Exam  Nursing note and vitals reviewed.   Review of Systems  All other systems reviewed and are negative.   Blood pressure (!) 148/98, pulse 86, temperature 98.6 F (37 C), temperature source Oral, resp. rate 16, height 6' (1.829 m), weight 57.2 kg (126 lb), SpO2 97 %.Body mass index is 17.09 kg/m.  General Appearance: Smells of urine  Eye Contact:  Fair  Speech:  Slow  Volume:  Normal  Mood:  Euthymic  Affect:  Appropriate  Thought Process:  Coherent and Goal Directed  Orientation:  Other:  Oriented to place and president  Thought Content:  Logical  Suicidal Thoughts:  No  Homicidal Thoughts:  No  Memory:  Immediate;   Poor  Judgement:  Impaired  Insight:  Lacking  Psychomotor Activity:  Normal  Concentration:  Concentration: Poor  Recall:  Poor  Fund of Knowledge:  Fair  Language:  Fair  Akathisia:  No      Assets:  Resilience  ADL's:  Intact   Cognition:  Impaired,  Mild  Sleep:  Number of Hours: 7     Treatment Plan Summary: 62 yo male admitted due to agitation at group home. He is overall organized in conversation. HE does have some cognitive impairment but overall does not appear delusional. He is has been compliant with medications and has not had any outbursts or agitation on the unit. He is trying to get out of his room more often.   Plan:  Schizophrenia -Received Invega SUstenna injection in the ED -Continue Risperdal 1 mg qhs -Continue Lithium 450 mg BID -Continue Remeron 30 mg qhs -Continue Prozac 20 mg daily  HTN -HCTZ 12.5 mg  Urinary symptoms/UTI -Keflex 500 mg BID for 14 days. On day 7/14 days -his urine culture has no growth  -Finasteride -Flomax 0.4 mg daily  Malnutrition -Ensure BID  Dispo -Pt has legal guardian. CSW spoke with her. He will return to group home when stable.     Haskell RilingHolly R McNew, MD 11/12/2017, 2:33 PM

## 2017-11-12 NOTE — Plan of Care (Signed)
  Problem: Education: Goal: Ability to incorporate positive changes in behavior to improve self-esteem will improve Outcome: Progressing   Problem: Health Behavior/Discharge Planning: Goal: Ability to identify and utilize available resources and services will improve Outcome: Progressing Goal: Ability to remain free from injury will improve Outcome: Progressing   Problem: Self-Concept: Goal: Ability to verbalize positive feelings about self will improve Outcome: Progressing   Problem: Coping: Goal: Ability to identify and develop effective coping behavior will improve Outcome: Progressing Goal: Ability to interact with others will improve Outcome: Progressing Goal: Participation in decision-making will improve Outcome: Progressing Goal: Ability to use eye contact when communicating with others will improve Outcome: Progressing   Problem: Activity: Goal: Interest or engagement in leisure activities will improve Outcome: Progressing Goal: Imbalance in normal sleep/wake cycle will improve Outcome: Progressing   Problem: Education: Goal: Knowledge of [place] [facility] Education information/materials will improve Outcome: Progressing Goal: Emotional status will improve Outcome: Progressing Goal: Mental status will improve Outcome: Progressing Goal: Verbalization of understanding the information provided will improve Outcome: Progressing   Problem: Education: Goal: Knowledge of General Education information will improve Outcome: Progressing   Problem: Health Behavior/Discharge Planning: Goal: Ability to manage health-related needs will improve Outcome: Progressing   Problem: Clinical Measurements: Goal: Ability to maintain clinical measurements within normal limits will improve Outcome: Progressing Goal: Will remain free from infection Outcome: Progressing Goal: Diagnostic test results will improve Outcome: Progressing Goal: Respiratory complications will  improve Outcome: Progressing Goal: Cardiovascular complication will be avoided Outcome: Progressing   Problem: Activity: Goal: Risk for activity intolerance will decrease Outcome: Progressing   Problem: Nutrition: Goal: Adequate nutrition will be maintained Outcome: Progressing   Problem: Coping: Goal: Level of anxiety will decrease Outcome: Progressing   Problem: Elimination: Goal: Will not experience complications related to bowel motility Outcome: Progressing Goal: Will not experience complications related to urinary retention Outcome: Progressing   Problem: Pain Managment: Goal: General experience of comfort will improve Outcome: Progressing   Problem: Safety: Goal: Ability to remain free from injury will improve Outcome: Progressing   Problem: Skin Integrity: Goal: Risk for impaired skin integrity will decrease Outcome: Progressing   Problem: Spiritual Needs Goal: Ability to function at adequate level Outcome: Progressing   Problem: Activity: Goal: Sleeping patterns will improve Outcome: Progressing  Denies SI/HI/AVH.  Isolative to room except for meals and medication pass. Support and encouragement offered, safety maintained.

## 2017-11-13 MED ORDER — CLONIDINE HCL 0.1 MG PO TABS
0.1000 mg | ORAL_TABLET | Freq: Two times a day (BID) | ORAL | Status: DC | PRN
Start: 2017-11-13 — End: 2017-11-15
  Administered 2017-11-14: 0.1 mg via ORAL

## 2017-11-13 MED ORDER — CLONIDINE HCL 0.1 MG PO TABS
0.1000 mg | ORAL_TABLET | Freq: Once | ORAL | Status: AC
  Administered 2017-11-13: 0.1 mg via ORAL
  Filled 2017-11-13: qty 1

## 2017-11-13 NOTE — Plan of Care (Signed)
Patient slept for Estimated Hours of 8.45; Precautionary checks every 15 minutes for safety maintained, room free of safety hazards, patient sustains no injury or falls during this shift.  

## 2017-11-13 NOTE — Plan of Care (Signed)
  Problem: Education: Goal: Ability to incorporate positive changes in behavior to improve self-esteem will improve Outcome: Progressing   Problem: Health Behavior/Discharge Planning: Goal: Ability to remain free from injury will improve Outcome: Progressing Note:  Remains safe on the unit   Problem: Self-Concept: Goal: Ability to verbalize positive feelings about self will improve Outcome: Progressing Note:  Verbalizes that he is doing okay   Problem: Activity: Goal: Interest or engagement in leisure activities will improve Outcome: Progressing Note:  Stays in room in bed.  No group attendance.  No interaction with peers.     Problem: Education: Goal: Emotional status will improve Outcome: Progressing Note:  Verbalizes that he feels better.  Goal: Mental status will improve Outcome: Progressing Note:  Denies SI/HI/AVH.   Verbalizes that he feels better.  Goal: Verbalization of understanding the information provided will improve Outcome: Progressing   Problem: Clinical Measurements: Goal: Will remain free from infection Outcome: Progressing Note:  Taking antibiotic for UTI   Problem: Activity: Goal: Risk for activity intolerance will decrease Outcome: Progressing   Problem: Nutrition: Goal: Adequate nutrition will be maintained Outcome: Progressing   Problem: Coping: Goal: Level of anxiety will decrease Outcome: Progressing   Problem: Elimination: Goal: Will not experience complications related to urinary retention Outcome: Progressing   Problem: Pain Managment: Goal: General experience of comfort will improve Outcome: Progressing   Problem: Safety: Goal: Ability to remain free from injury will improve Outcome: Progressing Note:  Remains safe on the unit.     Problem: Skin Integrity: Goal: Risk for impaired skin integrity will decrease Outcome: Progressing   Problem: Spiritual Needs Goal: Ability to function at adequate level Outcome:  Progressing   Problem: Coping: Goal: Ability to interact with others will improve Outcome: Not Progressing Note:  Isolates to room.  Up for meals and medication pass.  No interaction with peers.  No group attendance.  Goal: Ability to use eye contact when communicating with others will improve Outcome: Not Progressing Note:  Does not maintain eye contact.     Problem: Activity: Goal: Sleeping patterns will improve Outcome: Not Progressing Note:  Sleeps the majority of the day.     Problem: Education: Goal: Knowledge of General Education information will improve Outcome: Not Applicable   Problem: Health Behavior/Discharge Planning: Goal: Ability to manage health-related needs will improve Outcome: Not Applicable   Problem: Clinical Measurements: Goal: Ability to maintain clinical measurements within normal limits will improve Outcome: Not Applicable Goal: Respiratory complications will improve Outcome: Not Applicable Goal: Cardiovascular complication will be avoided Outcome: Not Applicable   Problem: Elimination: Goal: Will not experience complications related to bowel motility Outcome: Not Applicable   Problem: Coping: Goal: Ability to interact with others will improve Outcome: Not Progressing Note:  Isolates to room.  Up for meals and medication pass.  No interaction with peers.  No group attendance.  Goal: Ability to use eye contact when communicating with others will improve Outcome: Not Progressing Note:  Does not maintain eye contact.     Problem: Activity: Goal: Sleeping patterns will improve Outcome: Not Progressing Note:  Sleeps the majority of the day.

## 2017-11-13 NOTE — Progress Notes (Signed)
[facility] MD Progress Note  11/13/2017 3:23 PM Ray Jackson.  MRN:  [id number]   Subjective:  Pt states that he is doing fine today. He was incontinent again today. He states that he has to urinate so much and got sick of getting up so much so he wet the bed. He does drink 4 cups of coffee a day. Discussed this could be a cause of frequent urination. He denies SI or thoughts of self harm. Encouraged him to shower today which he agreed to do. He denies HI, AH. He is overall organized and goal directed. He is not fully oriented but his is about his baseline. He is able to state city, state and president.  He does not appear delirious.  Principal Problem: Schizophrenia (HCC) Diagnosis:   Patient Active Problem List   Diagnosis Date Noted  . Schizophrenia (HCC) [F20.9] 11/05/2017    Priority: High  . UTI (urinary tract infection) [N39.0] 10/02/2017  . Anemia [D64.9] 10/02/2017   Total Time spent with patient: 15 minutes  Past Psychiatric History: See H&P  Past Medical History:  Past Medical History:  Diagnosis Date  . ARF (acute renal failure) (HCC) 10/02/2017  . Constipation   . Hypertension   . Hypokalemia   . Hyponatremia 10/02/2017  . Noncompliance 11/05/2017  . Schizophrenia [facility])     Past Surgical History:  Procedure Laterality Date  . TONSILLECTOMY     Family History: History reviewed. No pertinent family history. Family Psychiatric  History: See H&P Social History:  Social History   Substance and Sexual Activity  Alcohol Use No  . Frequency: Never     Social History   Substance and Sexual Activity  Drug Use No    Social History   Socioeconomic History  . Marital status: Single    Spouse name: Not on file  . Number of children: Not on file  . Years of education: Not on file  . Highest education level: Not on file  Occupational History  . Not on file  Social Needs  . Financial resource strain: Not on file  . Food insecurity:    Worry: Not on file   Inability: Not on file  . Transportation needs:    Medical: Not on file    Non-medical: Not on file  Tobacco Use  . Smoking status: Current Every Day Smoker    Packs/day: 0.50  . Smokeless tobacco: Never Used  Substance and Sexual Activity  . Alcohol use: No    Frequency: Never  . Drug use: No  . Sexual activity: Not on file  Lifestyle  . Physical activity:    Days per week: Not on file    Minutes per session: Not on file  . Stress: Not on file  Relationships  . Social connections:    Talks on phone: Not on file    Gets together: Not on file    Attends religious service: Not on file    Active member of club or organization: Not on file    Attends meetings of clubs or organizations: Not on file    Relationship status: Not on file  Other Topics Concern  . Not on file  Social History Narrative  . Not on file   Additional Social History:                         Sleep: Good  Appetite:  Good  Current Medications: Current Facility-Administered Medications  Medication Dose Route  Frequency Provider Last Rate Last Dose  . acetaminophen (TYLENOL) tablet 650 mg  650 mg Oral Q6H PRN Clapacs, John T, MD      . alum & mag hydroxide-simeth (MAALOX/MYLANTA) 200-200-20 MG/5ML suspension 30 mL  30 mL Oral Q4H PRN Clapacs, John T, MD      . cephALEXin (KEFLEX) capsule 500 mg  500 mg Oral Q12H Lemoyne Scarpati, Ileene HutchinsonHolly R, MD   500 mg at 11/13/17 0915  . cholecalciferol (VITAMIN D) tablet 1,000 Units  1,000 Units Oral Daily Clapacs, Jackquline DenmarkJohn T, MD   1,000 Units at 11/13/17 0914  . clonazePAM (KLONOPIN) tablet 1 mg  1 mg Oral QHS Clapacs, Jackquline DenmarkJohn T, MD   1 mg at 11/12/17 2107  . cloNIDine (CATAPRES) tablet 0.1 mg  0.1 mg Oral BID PRN Gabrial Domine, Ileene HutchinsonHolly R, MD      . docusate sodium (COLACE) capsule 100 mg  100 mg Oral BID Clapacs, Jackquline DenmarkJohn T, MD   100 mg at 11/13/17 0914  . feeding supplement (ENSURE ENLIVE) (ENSURE ENLIVE) liquid 237 mL  237 mL Oral BID BM Anjoli Diemer, Ileene HutchinsonHolly R, MD   237 mL at 11/11/17 1403  .  finasteride (PROSCAR) tablet 5 mg  5 mg Oral Daily Clapacs, Jackquline DenmarkJohn T, MD   5 mg at 11/13/17 0914  . FLUoxetine (PROZAC) capsule 20 mg  20 mg Oral Daily Demarri Elie, Ileene HutchinsonHolly R, MD   20 mg at 11/13/17 0914  . fluticasone (FLONASE) 50 MCG/ACT nasal spray 2 spray  2 spray Each Nare BID Craigory Toste, Ileene HutchinsonHolly R, MD   2 spray at 11/13/17 0913  . hydrochlorothiazide (HYDRODIURIL) tablet 25 mg  25 mg Oral Daily Willett Lefeber, Ileene HutchinsonHolly R, MD   25 mg at 11/13/17 [id number]  . hydrOXYzine (ATARAX/VISTARIL) tablet 25 mg  25 mg Oral TID PRN Clapacs, Jackquline DenmarkJohn T, MD   25 mg at 11/07/17 2306  . lactulose (CHRONULAC) 10 GM/15ML solution 20 g  20 g Oral BID Clapacs, Jackquline DenmarkJohn T, MD   20 g at 11/13/17 0913  . lithium carbonate (ESKALITH) CR tablet 450 mg  450 mg Oral Q12H Clapacs, Jackquline DenmarkJohn T, MD   450 mg at 11/13/17 0914  . loratadine (CLARITIN) tablet 10 mg  10 mg Oral Daily Dajanique Robley, Ileene HutchinsonHolly R, MD   10 mg at 11/13/17 0915  . magnesium hydroxide (MILK OF MAGNESIA) suspension 30 mL  30 mL Oral Daily PRN Clapacs, John T, MD      . mirtazapine (REMERON) tablet 30 mg  30 mg Oral QHS Clapacs, Jackquline DenmarkJohn T, MD   30 mg at 11/12/17 2104  . polyethylene glycol (MIRALAX / GLYCOLAX) packet 17 g  17 g Oral Daily Clapacs, Jackquline DenmarkJohn T, MD   17 g at 11/13/17 0913  . risperiDONE (RISPERDAL M-TABS) disintegrating tablet 1 mg  1 mg Oral QHS Clapacs, John T, MD   1 mg at 11/12/17 2107  . tamsulosin (FLOMAX) capsule 0.4 mg  0.4 mg Oral Daily Clapacs, John T, MD   0.4 mg at 11/13/17 0915  . tiotropium (SPIRIVA) inhalation capsule 18 mcg  18 mcg Inhalation Daily Cashe Gatt, Ileene HutchinsonHolly R, MD   18 mcg at 11/13/17 1400  . traZODone (DESYREL) tablet 100 mg  100 mg Oral QHS PRN Haskell RilingMcNew, Janay Canan R, MD   100 mg at 11/11/17 2129  . vitamin B-12 (CYANOCOBALAMIN) tablet 1,000 mcg  1,000 mcg Oral Daily Clapacs, Jackquline DenmarkJohn T, MD   1,000 mcg at 11/13/17 [id number]    Lab Results: No results found for this or any previous visit (from the past  48 hour(s)).  Blood Alcohol level:  Lab Results  Component Value Date   ETH <10 11/05/2017    ETH <10 10/02/2017    Metabolic Disorder Labs: Lab Results  Component Value Date   HGBA1C 4.5 (L) 11/06/2017   MPG 82.45 11/06/2017   No results found for: PROLACTIN Lab Results  Component Value Date   CHOL 137 11/06/2017   TRIG 73 11/06/2017   HDL 49 11/06/2017   CHOLHDL 2.8 11/06/2017   VLDL 15 11/06/2017   LDLCALC 73 11/06/2017   LDLCALC 47 04/06/2014    Physical Findings: AIMS: Facial and Oral Movements Muscles of Facial Expression: None, normal Lips and Perioral Area: None, normal Jaw: None, normal Tongue: None, normal,Extremity Movements Upper (arms, wrists, hands, fingers): None, normal Lower (legs, knees, ankles, toes): None, normal, Trunk Movements Neck, shoulders, hips: None, normal, Overall Severity Severity of abnormal movements (highest score from questions above): None, normal Incapacitation due to abnormal movements: None, normal Patient's awareness of abnormal movements (rate only patient's report): No Awareness, Dental Status Current problems with teeth and/or dentures?: No Does patient usually wear dentures?: No  CIWA:  CIWA-Ar Total: 2 COWS:  COWS Total Score: 2  Musculoskeletal: Strength & Muscle Tone: within normal limits Gait & Station: normal Patient leans: N/A  Psychiatric Specialty Exam: Physical Exam  Nursing note and vitals reviewed.   Review of Systems  All other systems reviewed and are negative.   Blood pressure 120/78, pulse 89, temperature 97.6 F (36.4 C), temperature source Oral, resp. rate 18, height 6' (1.829 m), weight 57.2 kg (126 lb), SpO2 98 %.Body mass index is 17.09 kg/m.  General Appearance: Disheveled  Eye Contact:  Fair  Speech:  Clear and Coherent  Volume:  Normal  Mood:  Euthymic  Affect:  Appropriate  Thought Process:  Coherent and Goal Directed  Orientation:  Other:  Oriented to state, city and president  Thought Content:  Logical  Suicidal Thoughts:  No  Homicidal Thoughts:  No  Memory:  Immediate;    Poor  Judgement:  Impaired  Insight:  Lacking  Psychomotor Activity:  Normal  Concentration:  Concentration: Poor  Recall:  [employer] of Knowledge:  Fair  Language:  Fair  Akathisia:  No      Assets:  Resilience  ADL's:  Intact  Cognition:  Impaired,  Mild  Sleep:  Number of Hours: 8.45     Treatment Plan Summary: 62 yo male admitted due to agitation at group home. He is overall organized and goal directed. He likely has some mild cognitive impairment but is able to care for his ADLs well. He has been very calm with no agitation on the unit.   Plan:  Received Gean Birchwood in ED on 3/19. Next dose due on 4/16 -Continue Risperdal 1 mg qhs -Continue Lithium 450 mg BID -Continue Remeron and Prozac  HTN -HCTZ 25 mg daily -prn clonidine  UTI -Keflex 500 mg BID for 14 days. He is on day 8/14  -Finasteride -Flomax 0.4 mg daily  Malnutrition -Ensure BID  Dispo -Pt has legal guardian. CSW spoke with her. He will return to group home when stable-likely Friday     Haskell Riling, MD 11/13/2017, 3:23 PM

## 2017-11-13 NOTE — BHH Group Notes (Signed)
11/13/2017 1PM  Type of Therapy/Topic:  Group Therapy:  Feelings about Diagnosis  Participation Level:  Did Not Attend   Description of Group:   This group will allow patients to explore their thoughts and feelings about diagnoses they have received. Patients will be guided to explore their level of understanding and acceptance of these diagnoses. Facilitator will encourage patients to process their thoughts and feelings about the reactions of others to their diagnosis and will guide patients in identifying ways to discuss their diagnosis with significant others in their lives. This group will be process-oriented, with patients participating in exploration of their own experiences, giving and receiving support, and processing challenge from other group members.   Therapeutic Goals: 1. Patient will demonstrate understanding of diagnosis as evidenced by identifying two or more symptoms of the disorder 2. Patient will be able to express two feelings regarding the diagnosis 3. Patient will demonstrate their ability to communicate their needs through discussion and/or role play  Summary of Patient Progress: Pt was invited to attend group but chose not to attend. CSW will continue to encourage pt to attend group throughout their admission.     Therapeutic Modalities:   Cognitive Behavioral Therapy Brief Therapy  Heidi DachKelsey Delmon Andrada, MSW, LCSW 11/13/2017 2:01 PM

## 2017-11-13 NOTE — Progress Notes (Signed)
Patient ID: Ray Dikeoy Thomas Culotta Jr., male   DOB: [birth year], 62 y.o.   MRN: [id number] Disheveled looking still, pleasant, visitation by his uncle, Ray Jackson, went well; denied pain, denied SI/HI/AVH; complied with medications.

## 2017-11-13 NOTE — Progress Notes (Signed)
Patient ID: Ray Dikeoy Thomas Nusbaum Jr., male   DOB: 02-14-1956, 62 y.o.   MRN: [id number] CSW spoke with [facility] and Pt's group home to notify them of Discharge on Friday. Unable to reach Guardian.  Jake SharkSara Talayla Doyel, LCSW

## 2017-11-14 LAB — URINALYSIS, COMPLETE (UACMP) WITH MICROSCOPIC
Bacteria, UA: NONE SEEN
Bilirubin Urine: NEGATIVE
GLUCOSE, UA: NEGATIVE mg/dL
HGB URINE DIPSTICK: NEGATIVE
KETONES UR: NEGATIVE mg/dL
LEUKOCYTES UA: NEGATIVE
Nitrite: NEGATIVE
PROTEIN: NEGATIVE mg/dL
Specific Gravity, Urine: 1.009 (ref 1.005–1.030)
Squamous Epithelial / LPF: NONE SEEN
pH: 7 (ref 5.0–8.0)

## 2017-11-14 MED ORDER — PALIPERIDONE PALMITATE 156 MG/ML IM SUSP
156.0000 mg | INTRAMUSCULAR | Status: DC
Start: 2017-11-14 — End: 2022-10-22

## 2017-11-14 MED ORDER — CEPHALEXIN 500 MG PO CAPS: 500.0000 mg | ORAL_CAPSULE | Freq: Two times a day (BID) | ORAL | 0 refills | Status: DC

## 2017-11-14 MED ORDER — HYDROCHLOROTHIAZIDE 25 MG PO TABS: 25.0000 mg | ORAL_TABLET | Freq: Every day | ORAL | 0 refills | Status: DC

## 2017-11-14 NOTE — Plan of Care (Signed)
  Problem: Health Behavior/Discharge Planning: Goal: Ability to remain free from injury will improve Outcome: Progressing Note:  Remains safe on the unit.   Problem: Self-Concept: Goal: Ability to verbalize positive feelings about self will improve Outcome: Progressing   Problem: Coping: Goal: Ability to interact with others will improve Outcome: Progressing Note:  Ray Jackson outside with peers today   Problem: Activity: Goal: Interest or engagement in leisure activities will improve Outcome: Progressing Note:  Ray Jackson outside with peers.  Up to dayroom for meals.     Problem: Education: Goal: Emotional status will improve Outcome: Progressing Note:  Denies SI/HI/AVH.  Denies any depression.  States that he is doing well Goal: Mental status will improve Outcome: Progressing Note:  Denies SI/HI/AVH.  Denies any depression.  States that he is doing well   Problem: Clinical Measurements: Goal: Will remain free from infection Outcome: Progressing   Problem: Nutrition: Goal: Adequate nutrition will be maintained Outcome: Progressing   Problem: Coping: Goal: Level of anxiety will decrease Outcome: Progressing   Problem: Elimination: Goal: Will not experience complications related to urinary retention Outcome: Progressing   Problem: Pain Managment: Goal: General experience of comfort will improve Outcome: Progressing   Problem: Safety: Goal: Ability to remain free from injury will improve Outcome: Progressing   Problem: Skin Integrity: Goal: Risk for impaired skin integrity will decrease Outcome: Progressing   Problem: Spiritual Needs Goal: Ability to function at adequate level Outcome: Progressing   Problem: Education: Goal: Knowledge of [facility] Education information/materials will improve Outcome: Completed/Met

## 2017-11-14 NOTE — [place] FL2 (Signed)
[place] MEDICAID FL2 LEVEL OF CARE SCREENING TOOL     IDENTIFICATION  [facility]: [facility] Birthdate: [birth year] Sex: male Admission Date (Current Location): 11/05/2017  [place]   Facility and Address:  [facility], [address], [place], [place] [address]      Provider Number: [id number]  Attending Physician Name and Address:  Haskell RilingMcNew, Holly R, MD  Relative Name and Phone Number:  [phone], Reggie PileBeth Wicher     Current Level of Care: Hospital Recommended Level of Care: Other (Comment)(Group Home) Prior Approval Number:    Date Approved/Denied:   PASRR Number:    Discharge Plan: Other (Comment)(Group Home)    Current Diagnoses: Patient Active Problem List   Diagnosis Date Noted  . Schizophrenia (HCC) 11/05/2017  . UTI (urinary tract infection) 10/02/2017  . Anemia 10/02/2017    Orientation RESPIRATION BLADDER Height & Weight     Self, Time, Situation, Place  Normal Continent Weight: 126 lb (57.2 kg) Height:  6' (182.9 cm)  BEHAVIORAL SYMPTOMS/MOOD NEUROLOGICAL BOWEL NUTRITION STATUS    (None) Continent (None)  AMBULATORY STATUS COMMUNICATION OF NEEDS Skin   Independent Verbally Normal                       Personal Care Assistance Level of Assistance  (None)           Functional Limitations Info  (None)          SPECIAL CARE FACTORS FREQUENCY  (None)                    Contractures Contractures Info: Not present    Additional Factors Info  (None)               Current Medications (11/14/2017):  This is the current hospital active medication list Current Facility-Administered Medications  Medication Dose Route Frequency Provider Last Rate Last Dose  . acetaminophen (TYLENOL) tablet 650 mg  650 mg Oral Q6H PRN Clapacs, John T, MD      . alum & mag hydroxide-simeth (MAALOX/MYLANTA) 200-200-20 MG/5ML suspension 30 mL  30 mL Oral Q4H PRN Clapacs, John T, MD       . cephALEXin (KEFLEX) capsule 500 mg  500 mg Oral Q12H McNew, Ileene HutchinsonHolly R, MD   500 mg at 11/14/17 0827  . cholecalciferol (VITAMIN D) tablet 1,000 Units  1,000 Units Oral Daily Clapacs, Jackquline DenmarkJohn T, MD   1,000 Units at 11/14/17 [phone]  . clonazePAM (KLONOPIN) tablet 1 mg  1 mg Oral QHS Clapacs, Jackquline DenmarkJohn T, MD   1 mg at 11/13/17 2132  . cloNIDine (CATAPRES) tablet 0.1 mg  0.1 mg Oral BID PRN Haskell RilingMcNew, Holly R, MD   0.1 mg at 11/14/17 [id number]  . docusate sodium (COLACE) capsule 100 mg  100 mg Oral BID Clapacs, Jackquline DenmarkJohn T, MD   100 mg at 11/14/17 0827  . feeding supplement (ENSURE ENLIVE) (ENSURE ENLIVE) liquid 237 mL  237 mL Oral BID BM McNew, Ileene HutchinsonHolly R, MD   237 mL at 11/13/17 1848  . finasteride (PROSCAR) tablet 5 mg  5 mg Oral Daily Clapacs, Jackquline DenmarkJohn T, MD   5 mg at 11/14/17 0826  . FLUoxetine (PROZAC) capsule 20 mg  20 mg Oral Daily McNew, Ileene HutchinsonHolly R, MD   20 mg at 11/14/17 0826  . fluticasone (FLONASE) 50 MCG/ACT nasal spray 2 spray  2 spray Each Nare BID McNew, Ileene HutchinsonHolly R, MD   2 spray at  11/13/17 2133  . hydrochlorothiazide (HYDRODIURIL) tablet 25 mg  25 mg Oral Daily McNew, Ileene Hutchinson, MD   25 mg at 11/14/17 1610  . hydrOXYzine (ATARAX/VISTARIL) tablet 25 mg  25 mg Oral TID PRN Clapacs, Jackquline Denmark, MD   25 mg at 11/07/17 2306  . lactulose (CHRONULAC) 10 GM/15ML solution 20 g  20 g Oral BID Clapacs, Jackquline Denmark, MD   20 g at 11/14/17 0826  . lithium carbonate (ESKALITH) CR tablet 450 mg  450 mg Oral Q12H Clapacs, Jackquline Denmark, MD   450 mg at 11/14/17 0826  . loratadine (CLARITIN) tablet 10 mg  10 mg Oral Daily McNew, Ileene Hutchinson, MD   10 mg at 11/14/17 0826  . magnesium hydroxide (MILK OF MAGNESIA) suspension 30 mL  30 mL Oral Daily PRN Clapacs, John T, MD      . mirtazapine (REMERON) tablet 30 mg  30 mg Oral QHS Clapacs, Jackquline Denmark, MD   30 mg at 11/13/17 2132  . polyethylene glycol (MIRALAX / GLYCOLAX) packet 17 g  17 g Oral Daily Clapacs, Jackquline Denmark, MD   17 g at 11/14/17 0826  . risperiDONE (RISPERDAL M-TABS) disintegrating tablet 1 mg  1 mg Oral QHS  Clapacs, John T, MD   1 mg at 11/13/17 2134  . tamsulosin (FLOMAX) capsule 0.4 mg  0.4 mg Oral Daily Clapacs, Jackquline Denmark, MD   0.4 mg at 11/14/17 0826  . tiotropium (SPIRIVA) inhalation capsule 18 mcg  18 mcg Inhalation Daily Haskell Riling, MD   18 mcg at 11/14/17 [phone]  . traZODone (DESYREL) tablet 100 mg  100 mg Oral QHS PRN Haskell Riling, MD   100 mg at 11/11/17 2129  . vitamin B-12 (CYANOCOBALAMIN) tablet 1,000 mcg  1,000 mcg Oral Daily Clapacs, Jackquline Denmark, MD   1,000 mcg at 11/14/17 5409     Discharge Medications: Please see discharge summary for a list of discharge medications.  Relevant Imaging Results:  Relevant Lab Results:   Additional Information SS# [id number]  Heidi Dach, LCSW

## 2017-11-14 NOTE — B[facility] Group Notes (Signed)
LCSW Group Therapy Note 11/14/2017 9:00 AM  Type of Therapy and Topic:  Group Therapy:  Setting Goals  Participation Level:  Did Not Attend  Description of Group: In this process group, patients discussed using strengths to work toward goals and address challenges.  Patients identified two positive things about themselves and one goal they were working on.  Patients were given the opportunity to share openly and support each other's plan for self-empowerment.  The group discussed the value of gratitude and were encouraged to have a daily reflection of positive characteristics or circumstances.  Patients were encouraged to identify a plan to utilize their strengths to work on current challenges and goals.  Therapeutic Goals 1. Patient will verbalize personal strengths/positive qualities and relate how these can assist with achieving desired personal goals 2. Patients will verbalize affirmation of peers plans for personal change and goal setting 3. Patients will explore the value of gratitude and positive focus as related to successful achievement of goals 4. Patients will verbalize a plan for regular reinforcement of personal positive qualities and circumstances.  Summary of Patient Progress:   Ray Jackson was invited to today's goal, but chose not to attend.    Therapeutic Modalities Cognitive Behavioral Therapy Motivational Interviewing    Ray Jackson, [place] 11/14/2017 11:24 AM

## 2017-11-14 NOTE — Plan of Care (Addendum)
Patient found in day room upon my arrival. Patient is more visible and social this evening. Patient denies all complaints. Denies SI/HI/AVH. Describes mood as "okay." Reports feeling ready for discharge. Affect is brighter and more full range. Patient is polite and cooperative with assessment. Reports continued to report frequent urination. Remains on Keflex for UTI. Reports eating and sleeping adequately. Denies pain. Compliant with HS medications and staff direction. Q 15 minute checks maintained. Will continue to monitor throughout the shift. Patient slept 6.45 hours. No apparent distress. Will endorse care to oncoming shift.  Problem: Health Behavior/Discharge Planning: Goal: Ability to identify and utilize available resources and services will improve Outcome: Progressing Goal: Ability to remain free from injury will improve Outcome: Progressing   Problem: Self-Concept: Goal: Ability to verbalize positive feelings about self will improve Outcome: Progressing   Problem: Coping: Goal: Ability to identify and develop effective coping behavior will improve Outcome: Progressing Goal: Ability to interact with others will improve Outcome: Progressing Goal: Participation in decision-making will improve Outcome: Progressing Goal: Ability to use eye contact when communicating with others will improve Outcome: Progressing

## 2017-11-14 NOTE — Plan of Care (Signed)
Patient slept for Estimated Hours of 8.15; Precautionary checks every 15 minutes for safety maintained, room free of safety hazards, patient sustains no injury or falls during this shift.  Problem: Education: Goal: Ability to incorporate positive changes in behavior to improve self-esteem will improve Outcome: Progressing   Problem: Health Behavior/Discharge Planning: Goal: Ability to identify and utilize available resources and services will improve Outcome: Progressing Goal: Ability to remain free from injury will improve Outcome: Progressing   Problem: Self-Concept: Goal: Ability to verbalize positive feelings about self will improve Outcome: Progressing   Problem: Coping: Goal: Ability to identify and develop effective coping behavior will improve Outcome: Progressing Goal: Ability to interact with others will improve Outcome: Progressing Goal: Participation in decision-making will improve Outcome: Progressing Goal: Ability to use eye contact when communicating with others will improve Outcome: Progressing   Problem: Activity: Goal: Interest or engagement in leisure activities will improve Outcome: Progressing Goal: Imbalance in normal sleep/wake cycle will improve Outcome: Progressing   Problem: Education: Goal: Knowledge of [facility] Education information/materials will improve Outcome: Progressing Goal: Emotional status will improve Outcome: Progressing Goal: Mental status will improve Outcome: Progressing Goal: Verbalization of understanding the information provided will improve Outcome: Progressing   Problem: Clinical Measurements: Goal: Will remain free from infection Outcome: Progressing Goal: Diagnostic test results will improve Outcome: Progressing   Problem: Activity: Goal: Risk for activity intolerance will decrease Outcome: Progressing   Problem: Nutrition: Goal: Adequate nutrition will be maintained Outcome: Progressing   Problem:  Coping: Goal: Level of anxiety will decrease Outcome: Progressing   Problem: Elimination: Goal: Will not experience complications related to urinary retention Outcome: Progressing   Problem: Pain Managment: Goal: General experience of comfort will improve Outcome: Progressing   Problem: Safety: Goal: Ability to remain free from injury will improve Outcome: Progressing   Problem: Skin Integrity: Goal: Risk for impaired skin integrity will decrease Outcome: Progressing   Problem: Spiritual Needs Goal: Ability to function at adequate level Outcome: Progressing   Problem: Activity: Goal: Sleeping patterns will improve Outcome: Progressing

## 2017-11-14 NOTE — [facility] Group Notes (Signed)
11/14/2017  Time: 1PM  Type of Therapy/Topic:  Group Therapy:  Balance in Life  Participation Level:  Did Not Attend  Description of Group:   This group will address the concept of balance and how it feels and looks when one is unbalanced. Patients will be encouraged to process areas in their lives that are out of balance and identify reasons for remaining unbalanced. Facilitators will guide patients in utilizing problem-solving interventions to address and correct the stressor making their life unbalanced. Understanding and applying boundaries will be explored and addressed for obtaining and maintaining a balanced life. Patients will be encouraged to explore ways to assertively make their unbalanced needs known to significant others in their lives, using other group members and facilitator for support and feedback.  Therapeutic Goals: 1. Patient will identify two or more emotions or situations they have that consume much of in their lives. 2. Patient will identify signs/triggers that life has become out of balance:  3. Patient will identify two ways to set boundaries in order to achieve balance in their lives:  4. Patient will demonstrate ability to communicate their needs through discussion and/or role plays  Summary of Patient Progress: Pt was invited to attend group but chose not to attend. CSW will continue to encourage pt to attend group throughout their admission.    Therapeutic Modalities:   Cognitive Behavioral Therapy Solution-Focused Therapy Assertiveness Training  Heidi DachKelsey Jaysten Essner, MSW, LCSW 11/14/2017 2:15 PM

## 2017-11-14 NOTE — Progress Notes (Signed)
[facility] MD Progress Note  11/14/2017 4:03 PM Ray Jackson.  MRN:  [id number] Subjective:  Pt states that he is doing well. He slept well last night. He did spend some time outside today. Mood is good. He is eating better. He is more oriented today. He denies SI or thoughts of self harm. He is trying to decide what to do with the 100 dollar check his uncle gave him. He is not sure what to spend it on.   Principal Problem: Schizophrenia (HCC) Diagnosis:   Patient Active Problem List   Diagnosis Date Noted  . Schizophrenia (HCC) [F20.9] 11/05/2017    Priority: High  . UTI (urinary tract infection) [N39.0] 10/02/2017  . Anemia [D64.9] 10/02/2017   Total Time spent with patient: 15 minutes  Past Psychiatric History: See h&p  Past Medical History:  Past Medical History:  Diagnosis Date  . ARF (acute renal failure) (HCC) 10/02/2017  . Constipation   . Hypertension   . Hypokalemia   . Hyponatremia 10/02/2017  . Noncompliance 11/05/2017  . Schizophrenia [facility])     Past Surgical History:  Procedure Laterality Date  . TONSILLECTOMY     Family History: History reviewed. No pertinent family history. Family Psychiatric  History: See H&P Social History:  Social History   Substance and Sexual Activity  Alcohol Use No  . Frequency: Never     Social History   Substance and Sexual Activity  Drug Use No    Social History   Socioeconomic History  . Marital status: Single    Spouse name: Not on file  . Number of children: Not on file  . Years of education: Not on file  . Highest education level: Not on file  Occupational History  . Not on file  Social Needs  . Financial resource strain: Not on file  . Food insecurity:    Worry: Not on file    Inability: Not on file  . Transportation needs:    Medical: Not on file    Non-medical: Not on file  Tobacco Use  . Smoking status: Current Every Day Smoker    Packs/day: 0.50  . Smokeless tobacco: Never Used  Substance and Sexual  Activity  . Alcohol use: No    Frequency: Never  . Drug use: No  . Sexual activity: Not on file  Lifestyle  . Physical activity:    Days per week: Not on file    Minutes per session: Not on file  . Stress: Not on file  Relationships  . Social connections:    Talks on phone: Not on file    Gets together: Not on file    Attends religious service: Not on file    Active member of club or organization: Not on file    Attends meetings of clubs or organizations: Not on file    Relationship status: Not on file  Other Topics Concern  . Not on file  Social History Narrative  . Not on file   Additional Social History:                         Sleep: Good  Appetite:  Good  Current Medications: Current Facility-Administered Medications  Medication Dose Route Frequency Provider Last Rate Last Dose  . acetaminophen (TYLENOL) tablet 650 mg  650 mg Oral Q6H PRN Clapacs, John T, MD      . alum & mag hydroxide-simeth (MAALOX/MYLANTA) 200-200-20 MG/5ML suspension 30 mL  30 mL  Oral Q4H PRN Clapacs, John T, MD      . cephALEXin (KEFLEX) capsule 500 mg  500 mg Oral Q12H Ferguson Gertner, Ileene Hutchinson, MD   500 mg at 11/14/17 0827  . cholecalciferol (VITAMIN D) tablet 1,000 Units  1,000 Units Oral Daily Clapacs, Jackquline Denmark, MD   1,000 Units at 11/14/17 [phone]  . clonazePAM (KLONOPIN) tablet 1 mg  1 mg Oral QHS Clapacs, Jackquline Denmark, MD   1 mg at 11/13/17 2132  . cloNIDine (CATAPRES) tablet 0.1 mg  0.1 mg Oral BID PRN Haskell Riling, MD   0.1 mg at 11/14/17 5409  . docusate sodium (COLACE) capsule 100 mg  100 mg Oral BID Clapacs, Jackquline Denmark, MD   100 mg at 11/14/17 0827  . feeding supplement (ENSURE ENLIVE) (ENSURE ENLIVE) liquid 237 mL  237 mL Oral BID BM Jaonna Word R, MD   237 mL at 11/14/17 1016  . finasteride (PROSCAR) tablet 5 mg  5 mg Oral Daily Clapacs, Jackquline Denmark, MD   5 mg at 11/14/17 0826  . FLUoxetine (PROZAC) capsule 20 mg  20 mg Oral Daily Jovon Streetman, Ileene Hutchinson, MD   20 mg at 11/14/17 0826  . fluticasone (FLONASE) 50  MCG/ACT nasal spray 2 spray  2 spray Each Nare BID Jaanai Salemi, Ileene Hutchinson, MD   2 spray at 11/13/17 2133  . hydrochlorothiazide (HYDRODIURIL) tablet 25 mg  25 mg Oral Daily Everlynn Sagun, Ileene Hutchinson, MD   25 mg at 11/14/17 8119  . hydrOXYzine (ATARAX/VISTARIL) tablet 25 mg  25 mg Oral TID PRN Clapacs, Jackquline Denmark, MD   25 mg at 11/07/17 2306  . lactulose (CHRONULAC) 10 GM/15ML solution 20 g  20 g Oral BID Clapacs, Jackquline Denmark, MD   20 g at 11/14/17 0826  . lithium carbonate (ESKALITH) CR tablet 450 mg  450 mg Oral Q12H Clapacs, Jackquline Denmark, MD   450 mg at 11/14/17 0826  . loratadine (CLARITIN) tablet 10 mg  10 mg Oral Daily Akeira Lahm, Ileene Hutchinson, MD   10 mg at 11/14/17 0826  . magnesium hydroxide (MILK OF MAGNESIA) suspension 30 mL  30 mL Oral Daily PRN Clapacs, John T, MD      . mirtazapine (REMERON) tablet 30 mg  30 mg Oral QHS Clapacs, Jackquline Denmark, MD   30 mg at 11/13/17 2132  . polyethylene glycol (MIRALAX / GLYCOLAX) packet 17 g  17 g Oral Daily Clapacs, Jackquline Denmark, MD   17 g at 11/14/17 0826  . risperiDONE (RISPERDAL M-TABS) disintegrating tablet 1 mg  1 mg Oral QHS Clapacs, John T, MD   1 mg at 11/13/17 2134  . tamsulosin (FLOMAX) capsule 0.4 mg  0.4 mg Oral Daily Clapacs, Jackquline Denmark, MD   0.4 mg at 11/14/17 0826  . tiotropium (SPIRIVA) inhalation capsule 18 mcg  18 mcg Inhalation Daily Haskell Riling, MD   18 mcg at 11/14/17 [phone]  . traZODone (DESYREL) tablet 100 mg  100 mg Oral QHS PRN Haskell Riling, MD   100 mg at 11/11/17 2129  . vitamin B-12 (CYANOCOBALAMIN) tablet 1,000 mcg  1,000 mcg Oral Daily Clapacs, Jackquline Denmark, MD   1,000 mcg at 11/14/17 2956    Lab Results: No results found for this or any previous visit (from the past 48 hour(s)).  Blood Alcohol level:  Lab Results  Component Value Date   ETH <10 11/05/2017   ETH <10 10/02/2017    Metabolic Disorder Labs: Lab Results  Component Value Date   HGBA1C 4.5 (  L) 11/06/2017   MPG 82.45 11/06/2017   No results found for: PROLACTIN Lab Results  Component Value Date   CHOL 137  11/06/2017   TRIG 73 11/06/2017   HDL 49 11/06/2017   CHOLHDL 2.8 11/06/2017   VLDL 15 11/06/2017   LDLCALC 73 11/06/2017   LDLCALC 47 04/06/2014    Physical Findings: AIMS: Facial and Oral Movements Muscles of Facial Expression: None, normal Lips and Perioral Area: None, normal Jaw: None, normal Tongue: None, normal,Extremity Movements Upper (arms, wrists, hands, fingers): None, normal Lower (legs, knees, ankles, toes): None, normal, Trunk Movements Neck, shoulders, hips: None, normal, Overall Severity Severity of abnormal movements (highest score from questions above): None, normal Incapacitation due to abnormal movements: None, normal Patient's awareness of abnormal movements (rate only patient's report): No Awareness, Dental Status Current problems with teeth and/or dentures?: No Does patient usually wear dentures?: No  CIWA:  CIWA-Ar Total: 2 COWS:  COWS Total Score: 2  Musculoskeletal: Strength & Muscle Tone: within normal limits Gait & Station: normal Patient leans: N/A  Psychiatric Specialty Exam: Physical Exam  Nursing note and vitals reviewed.   Review of Systems  All other systems reviewed and are negative.   Blood pressure 139/85, pulse 90, temperature (!) 97.5 F (36.4 C), temperature source Oral, resp. rate 18, height 6' (1.829 m), weight 57.2 kg (126 lb), SpO2 97 %.Body mass index is 17.09 kg/m.  General Appearance: Casual, he showered today  Eye Contact:  Fair  Speech:  Slow  Volume:  Normal  Mood:  Euthymic  Affect:  Appropriate  Thought Process:  Coherent and Goal Directed  Orientation:  Full (Time, Place, and Person)  Thought Content:  Logical  Suicidal Thoughts:  No  Homicidal Thoughts:  No  Memory:  Immediate;   Poor  Judgement:  Impaired  Insight:  Lacking  Psychomotor Activity:  Normal  Concentration:  Concentration: Poor  Recall:  Fair  Fund of Knowledge:  Fair  Language:  Fair  Akathisia:  No      Assets:  Resilience  ADL's:   Intact  Cognition:  Impaired,  Mild  Sleep:  Number of Hours: 7.25     Treatment Plan Summary: 62 yo male admitted due to agitation. He is being treated for UTI. Urine culture has been negative. He likely has some cognitive impairment but is able to care for his ADLs. He did shower today. He is more oriented today.   Plan:  Received Gean BirchwoodInvega Sustenna on 2/19. Next dose due on 4/16 -Continue Risperdal, lithium, and Remeron, Prozac  HTN -HCTZ 25 mg daily -prn clonidine  UTI -Keflex 500 mg BID for 14 days. He is on day 9/14 -will recheck UA prior to discharge  -Finasteride -Flomax 0.4 mg daily  Malnutrition -Ensure BID  Dispo -Pt has legal guardian. I spoke with her today to provide update and plans for discharge tomorrow. He will return to group home when stable-likely Friday    Haskell RilingHolly R Ziyon Cedotal, MD 11/14/2017, 4:03 PM

## 2017-11-14 NOTE — Progress Notes (Signed)
Patient ID: Ray Dikeoy Thomas Sluder Jr., male   DOB: 06-27-1956, 62 y.o.   MRN: [id number] Disheveled still, bushy and unkempt beards, poorly kept skin appearance, foul body odor, poor hygiene; denied pain, jovial, happy, had BM, denied SI/HI/AVH.

## 2017-11-14 NOTE — Progress Notes (Signed)
Patient ID: Ray Dikeoy Thomas Couse Jr., male   DOB: [birth year], 62 y.o.   MRN: [id number] CSW contacted [name] to inform [facility] that pt will be discharging from the hospital on Friday, November 15, 2017.  [name] informed CSW [facility] 12[facility].  CSW will informed pt's doctor and RN about [facility].  CSW also contacted pt's guardian, [name], to inform her that pt would be discharging from the hospital on 11/15/17.  Ms. [name] asked if pt was medically ready to be discharged.  CSW gave Ms. Witcher pt's attending doctor's, Corinna GabHolly McNew, MD, contact number so she can follow-up with her about her concerns.

## 2017-11-15 NOTE — Tx Team (Signed)
Interdisciplinary Treatment and Diagnostic Plan Update  11/15/2017 Time of Session: 11:00 AM Ray Jackson. MRN: [id number]  Principal Diagnosis: Schizophrenia [facility])  Secondary Diagnoses: Principal Problem:   Schizophrenia (HCC) Active Problems:   UTI (urinary tract infection)   Current Medications:  Current Facility-Administered Medications  Medication Dose Route Frequency Provider Last Rate Last Dose  . acetaminophen (TYLENOL) tablet 650 mg  650 mg Oral Q6H PRN Clapacs, John T, MD      . alum & mag hydroxide-simeth (MAALOX/MYLANTA) 200-200-20 MG/5ML suspension 30 mL  30 mL Oral Q4H PRN Clapacs, John T, MD      . cephALEXin (KEFLEX) capsule 500 mg  500 mg Oral Q12H McNew, Ileene Hutchinson, MD   500 mg at 11/15/17 0859  . cholecalciferol (VITAMIN D) tablet 1,000 Units  1,000 Units Oral Daily Clapacs, Jackquline Denmark, MD   1,000 Units at 11/15/17 0859  . clonazePAM (KLONOPIN) tablet 1 mg  1 mg Oral QHS Clapacs, Jackquline Denmark, MD   1 mg at 11/14/17 2116  . cloNIDine (CATAPRES) tablet 0.1 mg  0.1 mg Oral BID PRN Haskell Riling, MD   0.1 mg at 11/14/17 4098  . docusate sodium (COLACE) capsule 100 mg  100 mg Oral BID Clapacs, Jackquline Denmark, MD   100 mg at 11/15/17 0859  . feeding supplement (ENSURE ENLIVE) (ENSURE ENLIVE) liquid 237 mL  237 mL Oral BID BM McNew, Holly R, MD   237 mL at 11/15/17 1100  . finasteride (PROSCAR) tablet 5 mg  5 mg Oral Daily Clapacs, Jackquline Denmark, MD   5 mg at 11/15/17 0859  . FLUoxetine (PROZAC) capsule 20 mg  20 mg Oral Daily McNew, Ileene Hutchinson, MD   20 mg at 11/15/17 0900  . fluticasone (FLONASE) 50 MCG/ACT nasal spray 2 spray  2 spray Each Nare BID Haskell Riling, MD   2 spray at 11/15/17 0858  . hydrochlorothiazide (HYDRODIURIL) tablet 25 mg  25 mg Oral Daily McNew, Ileene Hutchinson, MD   25 mg at 11/15/17 0859  . hydrOXYzine (ATARAX/VISTARIL) tablet 25 mg  25 mg Oral TID PRN Clapacs, Jackquline Denmark, MD   25 mg at 11/07/17 2306  . lactulose (CHRONULAC) 10 GM/15ML solution 20 g  20 g Oral BID Clapacs, Jackquline Denmark, MD    20 g at 11/14/17 1742  . lithium carbonate (ESKALITH) CR tablet 450 mg  450 mg Oral Q12H Clapacs, Jackquline Denmark, MD   450 mg at 11/15/17 0859  . loratadine (CLARITIN) tablet 10 mg  10 mg Oral Daily McNew, Ileene Hutchinson, MD   10 mg at 11/15/17 0859  . magnesium hydroxide (MILK OF MAGNESIA) suspension 30 mL  30 mL Oral Daily PRN Clapacs, John T, MD      . mirtazapine (REMERON) tablet 30 mg  30 mg Oral QHS Clapacs, Jackquline Denmark, MD   30 mg at 11/14/17 2115  . polyethylene glycol (MIRALAX / GLYCOLAX) packet 17 g  17 g Oral Daily Clapacs, Jackquline Denmark, MD   17 g at 11/14/17 0826  . risperiDONE (RISPERDAL M-TABS) disintegrating tablet 1 mg  1 mg Oral QHS Clapacs, John T, MD   1 mg at 11/14/17 2115  . tamsulosin (FLOMAX) capsule 0.4 mg  0.4 mg Oral Daily Clapacs, John T, MD   0.4 mg at 11/15/17 0859  . tiotropium (SPIRIVA) inhalation capsule 18 mcg  18 mcg Inhalation Daily McNew, Ileene Hutchinson, MD   18 mcg at 11/15/17 0900  . traZODone (DESYREL) tablet 100 mg  100  mg Oral QHS PRN Haskell Riling, MD   100 mg at 11/14/17 2115  . vitamin B-12 (CYANOCOBALAMIN) tablet 1,000 mcg  1,000 mcg Oral Daily Clapacs, Jackquline Denmark, MD   1,000 mcg at 11/15/17 4098   PTA Medications: Medications Prior to Admission  Medication Sig Dispense Refill Last Dose  . clonazePAM (KLONOPIN) 1 MG tablet Take 1 mg by mouth at bedtime.      Marland Kitchen FLUoxetine (PROZAC) 20 MG tablet Take 20 mg by mouth daily.     . hydrochlorothiazide (HYDRODIURIL) 25 MG tablet Take 25 mg by mouth daily.     Marland Kitchen lithium carbonate (ESKALITH) 450 MG CR tablet Take 450 mg by mouth 2 (two) times daily.     . risperiDONE (RISPERDAL) 1 MG tablet Take 1 mg by mouth at bedtime.     . cetirizine (ZYRTEC) 10 MG tablet Take 10 mg by mouth at bedtime.    10/01/2017 at Unknown time  . Cholecalciferol (VITAMIN D3) 2000 units capsule Take 2,000 Units by mouth daily.   10/02/2017 at Unknown time  . docusate sodium (COLACE) 100 MG capsule Take 100 mg by mouth 2 (two) times daily.   10/02/2017 at Unknown time  .  finasteride (PROSCAR) 5 MG tablet Take 5 mg by mouth daily.   10/02/2017 at Unknown time  . fluticasone (FLONASE) 50 MCG/ACT nasal spray Place 2 sprays into both nostrils daily.   10/02/2017 at Unknown time  . lactulose (CHRONULAC) 10 GM/15ML solution Take 20 g by mouth 2 (two) times daily.    10/02/2017 at Unknown time  . mirtazapine (REMERON) 30 MG tablet Take 30 mg by mouth at bedtime.   10/01/2017 at Unknown time  . polyethylene glycol (MIRALAX / GLYCOLAX) packet Take 17 g by mouth daily.   10/02/2017 at Unknown time  . tamsulosin (FLOMAX) 0.4 MG CAPS capsule Take 1 capsule by mouth daily.   10/02/2017 at Unknown time  . traZODone (DESYREL) 100 MG tablet Take 200 mg by mouth at bedtime.   10/01/2017 at Unknown time  . vitamin B-12 (CYANOCOBALAMIN) 1000 MCG tablet Take 1,000 mcg by mouth daily.   10/02/2017 at Unknown time  . [DISCONTINUED] hydrochlorothiazide (HYDRODIURIL) 25 MG tablet Take 0.5 tablets (12.5 mg total) by mouth daily.     . [DISCONTINUED] paliperidone (INVEGA SUSTENNA) 156 MG/ML SUSP injection Inject 156 mg into the muscle every 30 (thirty) days.   09/29/2017 at Unknown time    Patient Stressors: Financial difficulties Health problems Medication change or noncompliance Occupational concerns  Patient Strengths: Ability for insight Capable of independent living Motivation for treatment/growth Supportive family/friends  Treatment Modalities: Medication Management, Group therapy, Case management,  1 to 1 session with clinician, Psychoeducation, Recreational therapy.   Physician Treatment Plan for Primary Diagnosis: Schizophrenia (HCC) Long Term Goal(s): Improvement in symptoms so as ready for discharge   Short Term Goals: Ability to demonstrate self-control will improve  Medication Management: Evaluate patient's response, side effects, and tolerance of medication regimen.  Therapeutic Interventions: 1 to 1 sessions, Unit Group sessions and Medication  administration.  Evaluation of Outcomes: Adequate for Discharge  Physician Treatment Plan for Secondary Diagnosis: Principal Problem:   Schizophrenia (HCC) Active Problems:   UTI (urinary tract infection)  Long Term Goal(s): Improvement in symptoms so as ready for discharge   Short Term Goals: Ability to demonstrate self-control will improve     Medication Management: Evaluate patient's response, side effects, and tolerance of medication regimen.  Therapeutic Interventions: 1 to 1 sessions, Unit Group  sessions and Medication administration.  Evaluation of Outcomes: Adequate for Discharge   RN Treatment Plan for Primary Diagnosis: Schizophrenia (HCC) Long Term Goal(s): Knowledge of disease and therapeutic regimen to maintain health will improve  Short Term Goals: Ability to verbalize feelings will improve, Ability to identify and develop effective coping behaviors will improve and Compliance with prescribed medications will improve  Medication Management: RN will administer medications as ordered by provider, will assess and evaluate patient's response and provide education to patient for prescribed medication. RN will report any adverse and/or side effects to prescribing provider.  Therapeutic Interventions: 1 on 1 counseling sessions, Psychoeducation, Medication administration, Evaluate responses to treatment, Monitor vital signs and CBGs as ordered, Perform/monitor CIWA, COWS, AIMS and Fall Risk screenings as ordered, Perform wound care treatments as ordered.  Evaluation of Outcomes: Adequate for Discharge   LCSW Treatment Plan for Primary Diagnosis: Schizophrenia [facility](HCC) Long Term Goal(s): Safe transition to appropriate next level of care at discharge, Engage patient in therapeutic group addressing interpersonal concerns.  Short Term Goals: Engage patient in aftercare planning with referrals and resources and Increase skills for wellness and recovery  Therapeutic Interventions:  Assess for all discharge needs, 1 to 1 time with Social worker, Explore available resources and support systems, Assess for adequacy in community support network, Educate family and significant other(s) on suicide prevention, Complete Psychosocial Assessment, Interpersonal group therapy.  Evaluation of Outcomes: Adequate for Discharge   Progress in Treatment: Attending groups: Yes. Participating in groups: Yes. Taking medication as prescribed: Yes. Toleration medication: Yes. Family/Significant other contact made: Yes, individual(s) contacted:  [name], Guardian/sister, [phone]  Patient understands diagnosis: Yes. Discussing patient identified problems/goals with staff: Yes. Medical problems stabilized or resolved: Yes. Denies suicidal/homicidal ideation: Yes. Issues/concerns per patient self-inventory: No. Other:   New problem(s) identified: No, Describe:  None  New Short Term/Long Term Goal(s): "To get over this UTI."  Discharge Plan or Barriers: Channing MuttersRoy will be discharged to day back to his group home and continue to work with his ACTT Team.   Reason for Continuation of Hospitalization: Other; describe Channing MuttersRoy is adequate for discharge.  Estimated Length of Stay: 2-3 days Attendees: Patient:  11/15/2017 3:51 PM  Physician: Corinna GabHolly McNew, MD 11/15/2017 3:51 PM  Nursing:  11/15/2017 3:51 PM  RN Care Manager: 11/15/2017 3:51 PM  Social Worker: Huey RomansSonya Kenzlie Disch, LCSW 11/15/2017 3:51 PM  Recreational Therapist:  11/15/2017 3:51 PM  Other: Heidi DachKelsey Craig, LCSW 11/15/2017 3:51 PM  Other:  11/15/2017 3:51 PM  Other: 11/15/2017 3:51 PM    Scribe for Treatment Team: Alease FrameSonya S Malcom Selmer, LCSW 11/15/2017 3:51 PM

## 2017-11-15 NOTE — [facility] Suicide Risk Assessment (Signed)
[facility]   Principal Problem: Schizophrenia [facility](HCC) Discharge Diagnoses:  Patient Active Problem List   Diagnosis Date Noted  . Schizophrenia (HCC) [F20.9] 11/05/2017    Priority: High  . UTI (urinary tract infection) [N39.0] 10/02/2017  . Anemia [D64.9] 10/02/2017      Mental Status Per Nursing Assessment::   On Admission:  NA  Demographic Factors:  Male, Caucasian and Unemployed  Loss Factors: Decline in physical health  Historical Factors: NA  Risk Reduction Factors:   Living with another person, especially a relative, Positive social support and Positive therapeutic relationship  Continued Clinical Symptoms:  Previous Psychiatric Diagnoses and Treatments Medical Diagnoses and Treatments/Surgeries  Cognitive Features That Contribute To Risk:  None    Suicide Risk:  Minimal: No identifiable suicidal ideation.    Follow-up Information    [facility] Go on 11/13/2017.   Why:  Your ACTT Team will be meeting with you on Saturday,  11/15/2017 at 1pn. Thank You! Contact information: [address] [place] [id number] [phone]           Plan Of Care/Follow-up recommendations: Follow up with ACT  Haskell RilingHolly R Marimar Suber, MD 11/15/2017, 10:31 AM

## 2017-11-15 NOTE — [facility] Group Notes (Signed)
LCSW Group Therapy Note  11/15/2017 1:00 pm  Type of Therapy and Topic:  Group Therapy:  Feelings around Relapse and Recovery  Participation Level:  Active   Description of Group:    Patients in this group will discuss emotions they experience before and after a relapse. They will process how experiencing these feelings, or avoidance of experiencing them, relates to having a relapse. Facilitator will guide patients to explore emotions they have related to recovery. Patients will be encouraged to process which emotions are more powerful. They will be guided to discuss the emotional reaction significant others in their lives may have to their relapse or recovery. Patients will be assisted in exploring ways to respond to the emotions of others without this contributing to a relapse.  Therapeutic Goals: 1. Patient will identify two or more emotions that lead to a relapse for them 2. Patient will identify two emotions that result when they relapse 3. Patient will identify two emotions related to recovery 4. Patient will demonstrate ability to communicate their needs through discussion and/or role plays   Summary of Patient Progress:  Ray Jackson came into group late and left early, but he was actively involved in the group discussion during his attendance. Ray Jackson shared that he doesn't think that he has had a "real relapse", but that there have been stressors in his life that have led to him having to be hospitalized to include death in his family, financial issues, discord in his home. Ray Jackson shared that he feels supported by the group home staff and that he can tell any staff member about how he feels especially if he is not doing well physically or emotionally.  Ray Jackson shared that he knows that he is in recovery whenever he sees the world as "looking bright" and he doesn't worry about little things.   Therapeutic Modalities:   Cognitive Behavioral Therapy Solution-Focused Therapy Assertiveness  Training Relapse Prevention Therapy   Alease FrameSonya S Ayriel Texidor, LCSW 11/15/2017 3:12 PM

## 2017-11-15 NOTE — Progress Notes (Signed)
Patient ID: Ray Dikeoy Thomas Deshmukh Jr., male   DOB: 1956-07-14, 62 y.o.   MRN: [id number]   Discharge Note:  Patient denies SI/HI/AVH at this time. Discharge instructions, AVS, SRA, and transition record gone over with patient and family. Patient belongings returned to patient. Patient agrees to comply with medication management, follow-up visit, and outpatient therapy. Patient questions and concerns addressed and answered. Patient ambulatory off unit. Patient discharged back to Group Home with Group Home staff.

## 2017-11-15 NOTE — Progress Notes (Signed)
D- Patient alert and oriented. Patient presents in a pleasant mood on assessment stating that he slept "pretty good" last night, however, he is anxious about "going back to assisted living. Patient rates his anxiety level a "9/10".  Patient denies SI, HI, AVH, and pain at this time. Patient also denies any signs/symptoms of depression at time. Patient's goal for today is to have a "healthy transition".  A- Scheduled medications administered to patient, per MD orders. Support and encouragement provided.  Routine safety checks conducted every 15 minutes. Patient informed to notify staff with problems or concerns.  R- No adverse drug reactions noted. Patient contracts for safety at this time. Patient compliant with medications and treatment plan. Patient receptive, calm, and cooperative. Patient interacts well with others on the unit.  Patient remains safe at this time.

## 2017-11-15 NOTE — Progress Notes (Signed)
  [facility] :  Will you be returning to the same living situation after discharge:  Yes,  Pt will be returning to his group home. At discharge, do you have transportation home?: Yes,  Pt will be picked up by a group home staff member. Do you have the ability to pay for your medications: Yes,  Pt has medical insurance and financial means to pay for his medications.  Release of information consent forms completed and in the chart;  Patient's signature needed at discharge.  Patient to Follow up at: Follow-up Information    [facility] Go on 11/13/2017.   Why:  Your ACTT Team will be meeting with you on Saturday,  11/15/2017 at 1pn. Thank You! Contact information: [address] [place] [address] [phone]           Next level of care provider [facility] Link:no  Safety Planning and Suicide Prevention discussed: Yes,  No safety concerns noted.  Have you used any form of tobacco in the last 30 days? (Cigarettes, Smokeless Tobacco, Cigars, and/or Pipes): Yes  Has patient been referred to the Quitline?: Patient refused referral  Patient has been referred for addiction treatment: N/A  Alease FrameSonya S Kylyn Mcdade, LCSW 11/15/2017, 9:51 AM

## 2017-11-15 NOTE — [place] FL2 (Signed)
[place] MEDICAID FL2 LEVEL OF CARE SCREENING TOOL     IDENTIFICATION  Patient Name: Ray ChadRoy Thomas [facility] Jr[facility] Birthdate: 1955-09-27 Sex: male Admission Date (Current Location): 11/05/2017  [place]   Facility and Address:  [facility], [address], [place], [place] [address]      Provider Number: [id number]  Attending Physician Name and Address:  Haskell RilingMcNew, Holly R, MD  Relative Name and Phone Number:  [phone], Reggie PileBeth Wicher     Current Level of Care: Hospital Recommended Level of Care: Other (Comment)(Group Home) Prior Approval Number:    Date Approved/Denied:   PASRR Number:    Discharge Plan: Other (Comment)(Group Home)    Current Diagnoses: Patient Active Problem List   Diagnosis Date Noted  . Schizophrenia (HCC) 11/05/2017  . UTI (urinary tract infection) 10/02/2017  . Anemia 10/02/2017    Orientation RESPIRATION BLADDER Height & Weight     Self, Time, Situation, Place  Normal Continent Weight: 126 lb (57.2 kg) Height:  6' (182.9 cm)  BEHAVIORAL SYMPTOMS/MOOD NEUROLOGICAL BOWEL NUTRITION STATUS    (None) Continent (None)  AMBULATORY STATUS COMMUNICATION OF NEEDS Skin   Independent Verbally Normal                       Personal Care Assistance Level of Assistance  (None)           Functional Limitations Info  (None)          SPECIAL CARE FACTORS FREQUENCY  (None)                    Contractures Contractures Info: Not present    Additional Factors Info  (None)               Current Medications (11/15/2017):  This is the current hospital active medication list Current Facility-Administered Medications  Medication Dose Route Frequency Provider Last Rate Last Dose  . acetaminophen (TYLENOL) tablet 650 mg  650 mg Oral Q6H PRN Clapacs, John T, MD      . alum & mag hydroxide-simeth (MAALOX/MYLANTA) 200-200-20 MG/5ML suspension 30 mL  30 mL Oral Q4H PRN Clapacs, John T, MD       . cephALEXin (KEFLEX) capsule 500 mg  500 mg Oral Q12H McNew, Ileene HutchinsonHolly R, MD   500 mg at 11/15/17 0859  . cholecalciferol (VITAMIN D) tablet 1,000 Units  1,000 Units Oral Daily Clapacs, Jackquline DenmarkJohn T, MD   1,000 Units at 11/15/17 0859  . clonazePAM (KLONOPIN) tablet 1 mg  1 mg Oral QHS Clapacs, Jackquline DenmarkJohn T, MD   1 mg at 11/14/17 2116  . cloNIDine (CATAPRES) tablet 0.1 mg  0.1 mg Oral BID PRN Haskell RilingMcNew, Holly R, MD   0.1 mg at 11/14/17 [id number]  . docusate sodium (COLACE) capsule 100 mg  100 mg Oral BID Clapacs, Jackquline DenmarkJohn T, MD   100 mg at 11/15/17 0859  . feeding supplement (ENSURE ENLIVE) (ENSURE ENLIVE) liquid 237 mL  237 mL Oral BID BM McNew, Ileene HutchinsonHolly R, MD   237 mL at 11/14/17 1812  . finasteride (PROSCAR) tablet 5 mg  5 mg Oral Daily Clapacs, Jackquline DenmarkJohn T, MD   5 mg at 11/15/17 0859  . FLUoxetine (PROZAC) capsule 20 mg  20 mg Oral Daily McNew, Ileene HutchinsonHolly R, MD   20 mg at 11/15/17 0900  . fluticasone (FLONASE) 50 MCG/ACT nasal spray 2 spray  2 spray Each Nare BID McNew, Ileene HutchinsonHolly R, MD   2 spray at  11/15/17 0858  . hydrochlorothiazide (HYDRODIURIL) tablet 25 mg  25 mg Oral Daily McNew, Ileene Hutchinson, MD   25 mg at 11/15/17 0859  . hydrOXYzine (ATARAX/VISTARIL) tablet 25 mg  25 mg Oral TID PRN Clapacs, Jackquline Denmark, MD   25 mg at 11/07/17 2306  . lactulose (CHRONULAC) 10 GM/15ML solution 20 g  20 g Oral BID Clapacs, Jackquline Denmark, MD   20 g at 11/14/17 1742  . lithium carbonate (ESKALITH) CR tablet 450 mg  450 mg Oral Q12H Clapacs, Jackquline Denmark, MD   450 mg at 11/15/17 0859  . loratadine (CLARITIN) tablet 10 mg  10 mg Oral Daily McNew, Ileene Hutchinson, MD   10 mg at 11/15/17 0859  . magnesium hydroxide (MILK OF MAGNESIA) suspension 30 mL  30 mL Oral Daily PRN Clapacs, John T, MD      . mirtazapine (REMERON) tablet 30 mg  30 mg Oral QHS Clapacs, Jackquline Denmark, MD   30 mg at 11/14/17 2115  . polyethylene glycol (MIRALAX / GLYCOLAX) packet 17 g  17 g Oral Daily Clapacs, Jackquline Denmark, MD   17 g at 11/14/17 0826  . risperiDONE (RISPERDAL M-TABS) disintegrating tablet 1 mg  1 mg Oral QHS  Clapacs, John T, MD   1 mg at 11/14/17 2115  . tamsulosin (FLOMAX) capsule 0.4 mg  0.4 mg Oral Daily Clapacs, John T, MD   0.4 mg at 11/15/17 0859  . tiotropium (SPIRIVA) inhalation capsule 18 mcg  18 mcg Inhalation Daily McNew, Ileene Hutchinson, MD   18 mcg at 11/15/17 0900  . traZODone (DESYREL) tablet 100 mg  100 mg Oral QHS PRN Haskell Riling, MD   100 mg at 11/14/17 2115  . vitamin B-12 (CYANOCOBALAMIN) tablet 1,000 mcg  1,000 mcg Oral Daily Clapacs, Jackquline Denmark, MD   1,000 mcg at 11/15/17 4098     Discharge Medications: Please see discharge summary for a list of discharge medications.  Relevant Imaging Results:  Relevant Lab Results:   Additional Information SS# [id number]  Alease Frame, LCSW

## 2017-11-15 NOTE — Progress Notes (Signed)
   11/15/17 1530  Clinical Encounter Type  Visited With Patient  Visit Type Initial (group)   Patient participated in chaplain led nature meditation group.

## 2017-11-15 NOTE — [facility] Group Notes (Signed)
[facility] Group Notes:  (Nursing/MHT/Case Management/Adjunct)  Date:  11/15/2017  Time:  12:29 AM  Type of Therapy:  Group Therapy  Participation Level:  Did Not Attend  Mayra NeerJackie L Joven Mom 11/15/2017, 12:29 AM

## 2017-11-15 NOTE — Discharge Summary (Signed)
Physician Discharge Summary Note  Patient:  Ray Jackson. is an 62 y.o., male MRN:  [id number] DOB:  [birth year] Patient phone:  [phone] (home)  Patient address:   [facility] [address] [place] [address],  Total Time spent with patient: 15 minutes  Plus 20 minutes of medication reconciliation, discharge planning, and discharge documentation   Date of Admission:  11/05/2017 Date of Discharge: 11/15/17  Reason for Admission:  Agitation at group home  Principal Problem: Schizophrenia [facility]) Discharge Diagnoses: Patient Active Problem List   Diagnosis Date Noted  . Schizophrenia (HCC) [F20.9] 11/05/2017    Priority: High  . UTI (urinary tract infection) [N39.0] 10/02/2017  . Anemia [D64.9] 10/02/2017    Past Psychiatric History: See H&P  Past Medical History:  Past Medical History:  Diagnosis Date  . ARF (acute renal failure) (HCC) 10/02/2017  . Constipation   . Hypertension   . Hypokalemia   . Hyponatremia 10/02/2017  . Noncompliance 11/05/2017  . Schizophrenia [facility])     Past Surgical History:  Procedure Laterality Date  . TONSILLECTOMY     Family History: History reviewed. No pertinent family history. Family Psychiatric  History: See H&P Social History:  Social History   Substance and Sexual Activity  Alcohol Use No  . Frequency: Never     Social History   Substance and Sexual Activity  Drug Use No    Social History   Socioeconomic History  . Marital status: Single    Spouse name: Not on file  . Number of children: Not on file  . Years of education: Not on file  . Highest education level: Not on file  Occupational History  . Not on file  Social Needs  . Financial resource strain: Not on file  . Food insecurity:    Worry: Not on file    Inability: Not on file  . Transportation needs:    Medical: Not on file    Non-medical: Not on file  Tobacco Use  . Smoking status: Current Every Day Smoker    Packs/day: 0.50  . Smokeless  tobacco: Never Used  Substance and Sexual Activity  . Alcohol use: No    Frequency: Never  . Drug use: No  . Sexual activity: Not on file  Lifestyle  . Physical activity:    Days per week: Not on file    Minutes per session: Not on file  . Stress: Not on file  Relationships  . Social connections:    Talks on phone: Not on file    Gets together: Not on file    Attends religious service: Not on file    Active member of club or organization: Not on file    Attends meetings of clubs or organizations: Not on file    Relationship status: Not on file  Other Topics Concern  . Not on file  Social History Narrative  . Not on file    Hospital Course:  Pt was restarted on all home medications with no changes. He received [place] injection 156 while in the ED. He was started on 14 day course of Keflex for UTI. He was very calm and cooperative the entire stay on the unit. He has no outbursts. He showered when he was asked to. Appetite was better and was drinking Ensure daily. He likely has some mild cognitive impairment but is able to care for his ADLs well. He was overall organized and goal directed during hospital stay with no evidence of  psychosis. On day of discharge, he had bright affect. HE was looking forward to discharging and possibly seeing his sister over the weekend. He denied any pain, particularly on urination. He will have 9 remaining doses of keflex. Repeat UA negative for UTI and Urine Culture showed no growth. He denied SI, HI, AH, VH  Physical Findings: AIMS: Facial and Oral Movements Muscles of Facial Expression: None, normal Lips and Perioral Area: None, normal Jaw: None, normal Tongue: None, normal,Extremity Movements Upper (arms, wrists, hands, fingers): None, normal Lower (legs, knees, ankles, toes): None, normal, Trunk Movements Neck, shoulders, hips: None, normal, Overall Severity Severity of abnormal movements (highest score from questions above): None,  normal Incapacitation due to abnormal movements: None, normal Patient's awareness of abnormal movements (rate only patient's report): No Awareness, Dental Status Current problems with teeth and/or dentures?: No Does patient usually wear dentures?: No  CIWA:  CIWA-Ar Total: 2 COWS:  COWS Total Score: 2  Musculoskeletal: Strength & Muscle Tone: within normal limits Gait & Station: unsteady Patient leans: N/A  Psychiatric Specialty Exam: Physical Exam  Nursing note and vitals reviewed.   Review of Systems  All other systems reviewed and are negative.   Blood pressure 126/86, pulse 88, temperature 98.4 F (36.9 C), temperature source Oral, resp. rate 18, height 6' (1.829 m), weight 57.2 kg (126 lb), SpO2 97 %.Body mass index is 17.09 kg/m.  General Appearance: Disheveled  Eye Contact:  Good  Speech:  Slow  Volume:  Normal  Mood:  Euthymic  Affect:  Congruent  Thought Process:  Coherent and Goal Directed  Orientation:  Other:  Orietned to Month, year and president, not to city  Thought Content:  Logical  Suicidal Thoughts:  No  Homicidal Thoughts:  No  Memory:  Immediate;   Poor  Judgement:  Fair  Insight:  Lacking  Psychomotor Activity:  Normal  Concentration:  Concentration: Poor  Recall:  Poor  Fund of Knowledge:  Fair  Language:  Fair  Akathisia:  No      Assets:  Resilience  ADL's:  Intact  Cognition:  Impaired,  Mild  Sleep:  Number of Hours: 6.45     Have you used any form of tobacco in the last 30 days? (Cigarettes, Smokeless Tobacco, Cigars, and/or Pipes): Yes  Has this patient used any form of tobacco in the last 30 days? (Cigarettes, Smokeless Tobacco, Cigars, and/or Pipes) Yes, Yes, A prescription for an FDA-approved tobacco cessation medication was offered at discharge and the patient refused  Blood Alcohol level:  Lab Results  Component Value Date   [facility] <10 11/05/2017   ETH <10 10/02/2017    Metabolic Disorder Labs:  Lab Results  Component  Value Date   HGBA1C 4.5 (L) 11/06/2017   MPG 82.45 11/06/2017   No results found for: PROLACTIN Lab Results  Component Value Date   CHOL 137 11/06/2017   TRIG 73 11/06/2017   HDL 49 11/06/2017   CHOLHDL 2.8 11/06/2017   VLDL 15 11/06/2017   LDLCALC 73 11/06/2017   LDLCALC 47 04/06/2014    See Psychiatric Specialty Exam and Suicide Risk Assessment completed by Attending Physician prior to discharge.  Discharge destination:  Home-group home  Is patient on multiple antipsychotic therapies at discharge:  No   Has Patient had three or more failed trials of antipsychotic monotherapy by history:  No  Recommended Plan for Multiple Antipsychotic Therapies: NA  Discharge Instructions    Call MD for:  difficulty breathing, headache or visual disturbances  Complete by:  As directed    Call MD for:  persistant nausea and vomiting   Complete by:  As directed    Call MD for:  redness, tenderness, or signs of infection (pain, swelling, redness, odor or green/yellow discharge around incision site)   Complete by:  As directed    Call MD for:  severe uncontrolled pain   Complete by:  As directed    Call MD for:  temperature >100.4   Complete by:  As directed    Diet - low sodium heart healthy   Complete by:  As directed    Increase activity slowly   Complete by:  As directed      Allergies as of 11/15/2017   No Known Allergies     Medication List    TAKE these medications     Indication  cephALEXin 500 MG capsule Commonly known as:  KEFLEX Take 1 capsule (500 mg total) by mouth every 12 (twelve) hours. Last dose on 4/3 Notes to patient:  You will have 9 more doses to take  Indication:  Infection of Genitals and/or Urinary Tract   cetirizine 10 MG tablet Commonly known as:  ZYRTEC Take 10 mg by mouth at bedtime.  Indication:  Acute Urticaria   clonazePAM 1 MG tablet Commonly known as:  KLONOPIN Take 1 mg by mouth at bedtime.  Indication:  anxiety   docusate sodium 100  MG capsule Commonly known as:  COLACE Take 100 mg by mouth 2 (two) times daily.  Indication:  Constipation   finasteride 5 MG tablet Commonly known as:  PROSCAR Take 5 mg by mouth daily.  Indication:  Benign Enlargement of Prostate   FLUoxetine 20 MG tablet Commonly known as:  PROZAC Take 20 mg by mouth daily.  Indication:  Depressive Phase of Manic-Depression   fluticasone 50 MCG/ACT nasal spray Commonly known as:  FLONASE Place 2 sprays into both nostrils daily.  Indication:  Allergic Rhinitis   hydrochlorothiazide 25 MG tablet Commonly known as:  HYDRODIURIL Take 25 mg by mouth daily. What changed:  Another medication with the same name was removed. Continue taking this medication, and follow the directions you see here.  Indication:  High Blood Pressure Disorder   lactulose 10 GM/15ML solution Commonly known as:  CHRONULAC Take 20 g by mouth 2 (two) times daily.  Indication:  Chronic Constipation   lithium carbonate 450 MG CR tablet Commonly known as:  ESKALITH Take 450 mg by mouth 2 (two) times daily.  Indication:  Schizoaffective Disorder   mirtazapine 30 MG tablet Commonly known as:  REMERON Take 30 mg by mouth at bedtime.  Indication:  Major Depressive Disorder   paliperidone 156 MG/ML Susp injection Commonly known as:  INVEGA SUSTENNA Inject 1 mL (156 mg total) into the muscle every 30 (thirty) days. Next dose due on 4/16 What changed:  additional instructions  Indication:  Schizoaffective Disorder   polyethylene glycol packet Commonly known as:  MIRALAX / GLYCOLAX Take 17 g by mouth daily.  Indication:  Constipation   risperiDONE 1 MG tablet Commonly known as:  RISPERDAL Take 1 mg by mouth at bedtime.  Indication:  Psychosis   tamsulosin 0.4 MG Caps capsule Commonly known as:  FLOMAX Take 1 capsule by mouth daily.  Indication:  Benign Enlargement of Prostate   traZODone 100 MG tablet Commonly known as:  DESYREL Take 200 mg by mouth at  bedtime.  Indication:  Trouble Sleeping   vitamin B-12 1000 MCG tablet Commonly  known as:  CYANOCOBALAMIN Take 1,000 mcg by mouth daily.  Indication:  Inadequate Vitamin B12   Vitamin D3 2000 units capsule Take 2,000 Units by mouth daily.  Indication:  Def      Follow-up Information    [facility] on 11/13/2017.   Why:  Your ACTT Team will be meeting with you on Saturday,  11/15/2017 at 1pn. Thank You! Contact information: [address] [place] [address] [phone]           Follow-up recommendations: Follow up with ACT team  Signed: Haskell Riling, MD 11/15/2017, 10:33 AM

## 2017-11-30 ENCOUNTER — Other Ambulatory Visit: Payer: Self-pay

## 2017-11-30 ENCOUNTER — Encounter (HOSPITAL_COMMUNITY): Payer: Self-pay | Admitting: Emergency Medicine

## 2017-11-30 ENCOUNTER — Emergency Department (HOSPITAL_COMMUNITY): Payer: Medicaid Other

## 2017-11-30 ENCOUNTER — Emergency Department (HOSPITAL_COMMUNITY)
Admission: EM | Admit: 2017-11-30 | Discharge: 2017-11-30 | Disposition: A | Discharge status: Home / Self Care | Payer: Medicaid Other | Attending: Emergency Medicine | Admitting: Emergency Medicine

## 2017-11-30 DIAGNOSIS — F17210 Nicotine dependence, cigarettes, uncomplicated: Secondary | ICD-10-CM | POA: Diagnosis not present

## 2017-11-30 DIAGNOSIS — N1330 Unspecified hydronephrosis: Secondary | ICD-10-CM | POA: Diagnosis not present

## 2017-11-30 DIAGNOSIS — Z79899 Other long term (current) drug therapy: Secondary | ICD-10-CM | POA: Insufficient documentation

## 2017-11-30 DIAGNOSIS — N390 Urinary tract infection, site not specified: Secondary | ICD-10-CM

## 2017-11-30 DIAGNOSIS — I10 Essential (primary) hypertension: Secondary | ICD-10-CM | POA: Insufficient documentation

## 2017-11-30 DIAGNOSIS — R339 Retention of urine, unspecified: Secondary | ICD-10-CM

## 2017-11-30 DIAGNOSIS — N35011 Post-traumatic bulbous urethral stricture: Secondary | ICD-10-CM | POA: Diagnosis not present

## 2017-11-30 DIAGNOSIS — R338 Other retention of urine: Secondary | ICD-10-CM | POA: Diagnosis not present

## 2017-11-30 DIAGNOSIS — Z466 Encounter for fitting and adjustment of urinary device: Secondary | ICD-10-CM | POA: Diagnosis not present

## 2017-11-30 DIAGNOSIS — N35819 Other urethral stricture, male, unspecified site: Secondary | ICD-10-CM

## 2017-11-30 DIAGNOSIS — R350 Frequency of micturition: Secondary | ICD-10-CM | POA: Diagnosis present

## 2017-11-30 LAB — CBC WITH DIFFERENTIAL/PLATELET
BASOS ABS: 0 10*3/uL (ref 0.0–0.1)
BASOS PCT: 0 %
Eosinophils Absolute: 0.4 10*3/uL (ref 0.0–0.7)
Eosinophils Relative: 4 %
HEMATOCRIT: 39.7 % (ref 39.0–52.0)
HEMOGLOBIN: 12.5 g/dL — AB (ref 13.0–17.0)
LYMPHS PCT: 15 %
Lymphs Abs: 1.5 10*3/uL (ref 0.7–4.0)
MCH: 32 pg (ref 26.0–34.0)
MCHC: 31.5 g/dL (ref 30.0–36.0)
MCV: 101.5 fL — AB (ref 78.0–100.0)
MONOS PCT: 9 %
Monocytes Absolute: 0.9 10*3/uL (ref 0.1–1.0)
NEUTROS ABS: 7.1 10*3/uL (ref 1.7–7.7)
NEUTROS PCT: 72 %
Platelets: 311 10*3/uL (ref 150–400)
RBC: 3.91 MIL/uL — ABNORMAL LOW (ref 4.22–5.81)
RDW: 14.5 % (ref 11.5–15.5)
WBC: 9.9 10*3/uL (ref 4.0–10.5)

## 2017-11-30 LAB — URINALYSIS, ROUTINE W REFLEX MICROSCOPIC
BILIRUBIN URINE: NEGATIVE
Bacteria, UA: NONE SEEN
Glucose, UA: NEGATIVE mg/dL
HGB URINE DIPSTICK: NEGATIVE
KETONES UR: NEGATIVE mg/dL
Nitrite: NEGATIVE
PH: 7 (ref 5.0–8.0)
Protein, ur: NEGATIVE mg/dL
SPECIFIC GRAVITY, URINE: 1.009 (ref 1.005–1.030)
Squamous Epithelial / LPF: NONE SEEN

## 2017-11-30 LAB — COMPREHENSIVE METABOLIC PANEL
ALK PHOS: 50 U/L (ref 38–126)
ALT: 13 U/L — ABNORMAL LOW (ref 17–63)
AST: 13 U/L — AB (ref 15–41)
Albumin: 3 g/dL — ABNORMAL LOW (ref 3.5–5.0)
Anion gap: 10 (ref 5–15)
BILIRUBIN TOTAL: 0.3 mg/dL (ref 0.3–1.2)
BUN: 16 mg/dL (ref 6–20)
CALCIUM: 9.9 mg/dL (ref 8.9–10.3)
CO2: 34 mmol/L — ABNORMAL HIGH (ref 22–32)
Chloride: 99 mmol/L — ABNORMAL LOW (ref 101–111)
Creatinine, Ser: 1.29 mg/dL — ABNORMAL HIGH (ref 0.61–1.24)
GFR calc Af Amer: >60 mL/min (ref >60)
GFR, EST NON AFRICAN AMERICAN: 58 mL/min — AB (ref >60)
GLUCOSE: 115 mg/dL — AB (ref 65–99)
Potassium: 2.9 mmol/L — ABNORMAL LOW (ref 3.5–5.1)
Sodium: 143 mmol/L (ref 135–145)
TOTAL PROTEIN: 6.1 g/dL — AB (ref 6.5–8.1)

## 2017-11-30 LAB — LITHIUM LEVEL: LITHIUM LVL: 0.83 mmol/L (ref 0.60–1.20)

## 2017-11-30 LAB — MAGNESIUM: Magnesium: 2 mg/dL (ref 1.7–2.4)

## 2017-11-30 MED ORDER — LIDOCAINE HCL 2 % EX GEL
1.0000 "application " | Freq: Once | CUTANEOUS | Status: AC
  Administered 2017-11-30: 1 via URETHRAL
  Filled 2017-11-30: qty 10

## 2017-11-30 MED ORDER — POTASSIUM CHLORIDE CRYS ER 20 MEQ PO TBCR: 20.0000 meq | EXTENDED_RELEASE_TABLET | Freq: Every day | ORAL | 0 refills | Status: DC

## 2017-11-30 MED ORDER — POTASSIUM CHLORIDE CRYS ER 20 MEQ PO TBCR
40.0000 meq | EXTENDED_RELEASE_TABLET | Freq: Once | ORAL | Status: AC
  Administered 2017-11-30: 40 meq via ORAL
  Filled 2017-11-30: qty 2

## 2017-11-30 MED ORDER — PHENAZOPYRIDINE HCL 100 MG PO TABS
100.0000 mg | ORAL_TABLET | Freq: Once | ORAL | Status: AC
  Administered 2017-11-30: 100 mg via ORAL
  Filled 2017-11-30: qty 1

## 2017-11-30 MED ORDER — CEFIXIME 400 MG PO CAPS
400.0000 mg | ORAL_CAPSULE | Freq: Once | ORAL | Status: AC
  Administered 2017-11-30: 400 mg via ORAL
  Filled 2017-11-30: qty 1

## 2017-11-30 NOTE — ED Notes (Signed)
Foley catheter emptied of amber colored urine.

## 2017-11-30 NOTE — Consult Note (Signed)
Consultation: Urinary retention, difficult Foley Requested by: Ivery Quale, PA-C  History of Present Illness: 62 year old white male with history of urinary retention.  He was seen in our office February 2019 after a bout of retention.  At that time ultrasound showed a distended bladder without hydronephrosis.  He passed a voiding trial and on follow-up a bladder scan read 232 mL.  He failed to return.  More recently, he has had incontinence, nocturia and frequency.  His bladder is noticeably distended on exam.  CT lumbar spine today revealed right greater than left hydronephrosis and about 2 L of urine in the bladder.  I reviewed all the images.  Nurse note reports attempt at 46 French Foley and 16 [place] coud catheter without success.   The patient recalls having to wear catheter before and he describes what sounds like CIC.  Past Medical History:  Diagnosis Date  . ARF (acute renal failure) (HCC) 10/02/2017  . Constipation   . Hypertension   . Hypokalemia   . Hyponatremia 10/02/2017  . Noncompliance 11/05/2017  . Schizophrenia [facility])    Past Surgical History:  Procedure Laterality Date  . TONSILLECTOMY      Home Medications:   (Not in a hospital admission) Allergies: No Known Allergies  History reviewed. No pertinent family history. Social History:  reports that he has been smoking.  He has been smoking about 0.50 packs per day. He has never used smokeless tobacco. He reports that he does not drink alcohol or use drugs.  ROS: A complete review of systems was performed.  All systems are negative except for pertinent findings as noted. Review of Systems  All other systems reviewed and are negative.   Physical Exam:  Vital signs in last 24 hours: Temp:  [98.2 F (36.8 C)] 98.2 F (36.8 C) (04/13 0820) Pulse Rate:  [60-66] 60 (04/13 1030) BP: (139-159)/(81-91) 139/91 (04/13 0900) SpO2:  [93 %-97 %] 97 % (04/13 1030) Weight:  [59 kg (130 lb)] 59 kg (130 lb) (04/13  0810) General:  Alert and oriented, No acute distress HEENT: Normocephalic, atraumatic Cardiovascular: Regular rate and rhythm Lungs: Clear bilaterally Abdomen: Soft, nontender, nondistended, no abdominal masses, bladder palpably distended  Back: No CVA tenderness Genitourinary:  Normal male phallus, circumcised, testes are descended bilaterally and non-tender and without masses, scrotum is normal in appearance without lesions or masses. Extremities: No edema Neurologic: Grossly intact  Laboratory Data:  Results for orders placed or performed during the hospital encounter of 11/30/17 (from the past 24 hour(s))  Urinalysis, Routine w reflex microscopic     Status: Abnormal   Collection Time: 11/30/17  8:15 AM  Result Value Ref Range   Color, Urine YELLOW YELLOW   APPearance HAZY (A) CLEAR   Specific Gravity, Urine 1.009 1.005 - 1.030   pH 7.0 5.0 - 8.0   Glucose, UA NEGATIVE NEGATIVE mg/dL   Hgb urine dipstick NEGATIVE NEGATIVE   Bilirubin Urine NEGATIVE NEGATIVE   Ketones, ur NEGATIVE NEGATIVE mg/dL   Protein, ur NEGATIVE NEGATIVE mg/dL   Nitrite NEGATIVE NEGATIVE   Leukocytes, UA SMALL (A) NEGATIVE   RBC / HPF 0-5 0 - 5 RBC/hpf   WBC, UA 6-30 0 - 5 WBC/hpf   Bacteria, UA NONE SEEN NONE SEEN   Squamous Epithelial / LPF NONE SEEN NONE SEEN   Mucus PRESENT    Budding Yeast PRESENT   CBC with Differential     Status: Abnormal   Collection Time: 11/30/17  9:27 AM  Result Value Ref  Range   WBC 9.9 4.0 - 10.5 K/uL   RBC 3.91 (L) 4.22 - 5.81 MIL/uL   Hemoglobin 12.5 (L) 13.0 - 17.0 g/dL   HCT 16.1 09.6 - 04.5 %   MCV 101.5 (H) 78.0 - 100.0 fL   MCH 32.0 26.0 - 34.0 pg   MCHC 31.5 30.0 - 36.0 g/dL   RDW 40.9 81.1 - 91.4 %   Platelets 311 150 - 400 K/uL   Neutrophils Relative % 72 %   Neutro Abs 7.1 1.7 - 7.7 K/uL   Lymphocytes Relative 15 %   Lymphs Abs 1.5 0.7 - 4.0 K/uL   Monocytes Relative 9 %   Monocytes Absolute 0.9 0.1 - 1.0 K/uL   Eosinophils Relative 4 %    Eosinophils Absolute 0.4 0.0 - 0.7 K/uL   Basophils Relative 0 %   Basophils Absolute 0.0 0.0 - 0.1 K/uL  Comprehensive metabolic panel     Status: Abnormal   Collection Time: 11/30/17  9:27 AM  Result Value Ref Range   Sodium 143 135 - 145 mmol/L   Potassium 2.9 (L) 3.5 - 5.1 mmol/L   Chloride 99 (L) 101 - 111 mmol/L   CO2 34 (H) 22 - 32 mmol/L   Glucose, Bld 115 (H) 65 - 99 mg/dL   BUN 16 6 - 20 mg/dL   Creatinine, Ser 7.82 (H) 0.61 - 1.24 mg/dL   Calcium 9.9 8.9 - 95.6 mg/dL   Total Protein 6.1 (L) 6.5 - 8.1 g/dL   Albumin 3.0 (L) 3.5 - 5.0 g/dL   AST 13 (L) 15 - 41 U/L   ALT 13 (L) 17 - 63 U/L   Alkaline Phosphatase 50 38 - 126 U/L   Total Bilirubin 0.3 0.3 - 1.2 mg/dL   GFR calc non Af Amer 58 (L) >60 mL/min   GFR calc Af Amer >60 >60 mL/min   Anion gap 10 5 - 15  Lithium level     Status: None   Collection Time: 11/30/17  9:27 AM  Result Value Ref Range   Lithium Lvl 0.83 0.60 - 1.20 mmol/L  Magnesium     Status: None   Collection Time: 11/30/17  9:27 AM  Result Value Ref Range   Magnesium 2.0 1.7 - 2.4 mg/dL   No results found for this or any previous visit (from the past 240 hour(s)). Creatinine: Recent Labs    11/30/17 0927  CREATININE 1.29*    Impression/Assessment/plan:  Urinary retention causing bilateral hydronephrosisJerilee Field 11/30/2017, 6:06 PM

## 2017-11-30 NOTE — ED Notes (Signed)
Ivery QualeHobson Bryant, PA notified that 16 Fr urinary catheter unable to pass as well as 16 Fr coude catheter.

## 2017-11-30 NOTE — ED Notes (Signed)
Leg bag attached to patient.  Patient educated on how to care for foley.

## 2017-11-30 NOTE — ED Provider Notes (Signed)
[facility] EMERGENCY DEPARTMENT Provider Note   CSN: [id number] Arrival date & time: 11/30/17  0809     History   Chief Complaint Chief Complaint  Patient presents with  . Urinary Frequency    HPI Ray Jackson. is a 62 y.o. male.  Patient is a 61 year old male who presents to the emergency department with complaint of urine frequency.  The information is obtained from the patient and the local nursing home.  Patient has been having a problem with some suprapubic discomfort as well as increased urine frequency and at times dysuria.  He was evaluated at the [facility] emergency department at hospital where he was found to have a urinary tract infection.  The patient was placed on Keflex.  The nursing facility reports the patient recently finished the Keflex and now he is having increased urine frequency and pain again.  He presents now for evaluation of this urine frequency.       Past Medical History:  Diagnosis Date  . ARF (acute renal failure) (HCC) 10/02/2017  . Constipation   . Hypertension   . Hypokalemia   . Hyponatremia 10/02/2017  . Noncompliance 11/05/2017  . Schizophrenia [facility])     Patient Active Problem List   Diagnosis Date Noted  . Schizophrenia (HCC) 11/05/2017  . UTI (urinary tract infection) 10/02/2017  . Anemia 10/02/2017    Past Surgical History:  Procedure Laterality Date  . TONSILLECTOMY          Home Medications    Prior to Admission medications   Medication Sig Start Date End Date Taking? Authorizing Provider  cephALEXin (KEFLEX) 500 MG capsule Take 1 capsule (500 mg total) by mouth every 12 (twelve) hours. Last dose on 4/3 11/14/17   McNew, Ileene Hutchinson, MD  cetirizine (ZYRTEC) 10 MG tablet Take 10 mg by mouth at bedtime.     [provider]  Cholecalciferol (VITAMIN D3) 2000 units capsule Take 2,000 Units by mouth daily.    [provider]  clonazePAM (KLONOPIN) 1 MG tablet Take 1 mg by mouth at bedtime.      [provider]  docusate sodium (COLACE) 100 MG capsule Take 100 mg by mouth 2 (two) times daily.    [provider]  finasteride (PROSCAR) 5 MG tablet Take 5 mg by mouth daily.    [provider]  FLUoxetine (PROZAC) 20 MG tablet Take 20 mg by mouth daily.    [provider]  fluticasone (FLONASE) 50 MCG/ACT nasal spray Place 2 sprays into both nostrils daily.    [provider]  hydrochlorothiazide (HYDRODIURIL) 25 MG tablet Take 25 mg by mouth daily.    [provider]  lactulose (CHRONULAC) 10 GM/15ML solution Take 20 g by mouth 2 (two) times daily.     [provider]  lithium carbonate (ESKALITH) 450 MG CR tablet Take 450 mg by mouth 2 (two) times daily.    [provider]  mirtazapine (REMERON) 30 MG tablet Take 30 mg by mouth at bedtime.    [provider]  paliperidone (INVEGA SUSTENNA) 156 MG/ML SUSP injection Inject 1 mL (156 mg total) into the muscle every 30 (thirty) days. Next dose due on 4/16 11/14/17   McNew, Ileene Hutchinson, MD  polyethylene glycol (MIRALAX / GLYCOLAX) packet Take 17 g by mouth daily.    [provider]  risperiDONE (RISPERDAL) 1 MG tablet Take 1 mg by mouth at bedtime.    [provider]  tamsulosin (FLOMAX) 0.4 MG  CAPS capsule Take 1 capsule by mouth daily. 09/13/17   [provider]  traZODone (DESYREL) 100 MG tablet Take 200 mg by mouth at bedtime.    [provider]  vitamin B-12 (CYANOCOBALAMIN) 1000 MCG tablet Take 1,000 mcg by mouth daily.    [provider]    Family History History reviewed. No pertinent family history.  Social History Social History   Tobacco Use  . Smoking status: Current Every Day Smoker    Packs/day: 0.50  . Smokeless tobacco: Never Used  Substance Use Topics  . Alcohol use: No    Frequency: Never  . Drug use: No     Allergies   Patient has no known allergies.   Review of Systems Review of Systems    Constitutional: Negative for activity change.       All ROS Neg except as noted in HPI  HENT: Negative for nosebleeds.   Eyes: Negative for photophobia and discharge.  Respiratory: Negative for cough, shortness of breath and wheezing.   Cardiovascular: Negative for chest pain and palpitations.  Gastrointestinal: Negative for abdominal pain and blood in stool.  Genitourinary: Positive for dysuria and frequency. Negative for hematuria.  Musculoskeletal: Negative for arthralgias, back pain and neck pain.  Skin: Negative.   Neurological: Negative for dizziness, seizures and speech difficulty.  Psychiatric/Behavioral: Negative for confusion and hallucinations.     Physical Exam Updated Vital Signs BP (!) 159/81   Pulse 66   Temp 98.2 F (36.8 C)   Ht 5\' 8"  (1.727 m)   Wt 59 kg (130 lb)   SpO2 95%   BMI 19.77 kg/m   Physical Exam  Constitutional: He is oriented to person, place, and time. He appears well-developed and well-nourished.  Non-toxic appearance.  HENT:  Head: Normocephalic.  Right Ear: Tympanic membrane and external ear normal.  Left Ear: Tympanic membrane and external ear normal.  Eyes: Pupils are equal, round, and reactive to light. EOM and lids are normal.  Neck: Normal range of motion. Neck supple. Carotid bruit is not present.  Cardiovascular: Normal rate, regular rhythm, normal heart sounds, intact distal pulses and normal pulses.  Pulmonary/Chest: Breath sounds normal. No respiratory distress.  Abdominal: Soft. Bowel sounds are normal. There is tenderness in the suprapubic area. There is no guarding and no CVA tenderness.  Significant bladder distention  Genitourinary: Prostate normal and penis normal.  Musculoskeletal: Normal range of motion.  Lymphadenopathy:       Head (right side): No submandibular adenopathy present.       Head (left side): No submandibular adenopathy present.    He has no cervical adenopathy.  Neurological: He is alert and oriented to  person, place, and time. He has normal strength. No cranial nerve deficit or sensory deficit.  Skin: Skin is warm and dry.  Psychiatric: He has a normal mood and affect. His speech is normal.  Nursing note and vitals reviewed.    ED Treatments / Results  Labs (all labs ordered are listed, but only abnormal results are displayed) Labs Reviewed  URINALYSIS, ROUTINE W REFLEX MICROSCOPIC - Abnormal; Notable for the following components:      Result Value   APPearance HAZY (*)    Leukocytes, UA SMALL (*)    All other components within normal limits  CBC WITH DIFFERENTIAL/PLATELET  COMPREHENSIVE METABOLIC PANEL  LITHIUM LEVEL    EKG None  Radiology No results found.  Procedures Procedures (including critical care time)  Medications Ordered in ED Medications - No  data to display   Initial Impression / Assessment and Plan / ED Course  I have reviewed the triage vital signs and the nursing notes.  Pertinent labs & imaging results that were available during my care of the patient were reviewed by me and considered in my medical decision making (see chart for details).       Final Clinical Impressions(s) / ED Diagnoses MDM Case discussed and reviewed with Dr Clarene Duke. Vital signs reviewed.  Patient states that he is having problems controlling his urine at times.  He says that he frequently urinates in his diaper before he can get to a bathroom.  She Bladder scan over by nursing staff. Attempt pass foley unsuccessful.  Complete blood count shows the hemoglobin to be slightly low at 12.5, the comprehensive metabolic panel shows potassium to be low at 2.9.  Oral potassium given. .  The CO2 was slightly elevated at 34, the glucose is elevated at 115.  The creatinine is elevated at 1.29.  No other significant changes noted on the metabolic panel.  CBC non-acute. Lithium level wnl. MAg 2.0. UA reveals a hazy sample with small leukocytes and 6-30 WBC. Culture sent to the lab.  Pt given suprex po.  Will obtain CT to r/o lesion causing urinary problem. CT scan of L spine CT reveals massive distention of the urinary bladder with a possible volume of between 1200 mL and 2000 mL.  There is mild lumbar spine degeneration, there is also some mild lumbar spinal stenosis present.  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 We will again try to pass a catheter for this patient.  There is a delay in obtaining new catheter kits.  Case discussed with urology. Dr Mena Goes will come in to see the patient.   Final diagnoses:  None    ED Discharge Orders    None       Ivery Quale, PA-C 12/01/17 1943    Samuel Jester, DO 12/07/17 [phone]

## 2017-11-30 NOTE — ED Notes (Signed)
Dr. Mena GoesEskridge (Urology) in the rm at this time.

## 2017-11-30 NOTE — ED Triage Notes (Signed)
[place] [facility] [place] for c/o recurrent increased urinary frequency and burning x2 days. PT finished Keflex antibiotics on 11/23/17 per his MAR from facility.

## 2017-11-30 NOTE — ED Notes (Signed)
Patient transported to CT via Doctor, general practicetretcher by Ronaldo MiyamotoKyle.

## 2017-11-30 NOTE — ED Notes (Signed)
[facility] [phone].  Will be here to pick up patient within an hour.

## 2017-11-30 NOTE — Discharge Instructions (Signed)
Indwelling Urinary Catheter Care, Adult  Take good care of your catheter to keep it working and to prevent problems.  How to wear your catheter  Attach your catheter to your leg with tape (adhesive tape) or a leg strap. Make sure it is not too tight. If you use tape, remove any bits of tape that are already on the catheter.  How to wear a drainage bag  You should have:   A large overnight bag.   A small leg bag.    Overnight Bag  You may wear the overnight bag at any time. Always keep the bag below the level of your bladder but off the floor. When you sleep, put a clean plastic bag in a wastebasket. Then hang the bag inside the wastebasket.  Leg Bag  Never wear the leg bag at night. Always wear the leg bag below your knee. Keep the leg bag secure with a leg strap or tape.  How to care for your skin   Clean the skin around the catheter at least once every day.   Shower every day. Do not take baths.   Put creams, lotions, or ointments on your genital area only as told by your doctor.   Do not use powders, sprays, or lotions on your genital area.  How to clean your catheter and your skin  1. Wash your hands with soap and water.  2. Wet a washcloth in warm water and gentle (mild) soap.  3. Use the washcloth to clean the skin where the catheter enters your body. Clean downward and wipe away from the catheter in small circles. Do not wipe toward the catheter.  4. Pat the area dry with a clean towel. Make sure to clean off all soap.  How to care for your drainage bags  Empty your drainage bag when it is ?- full or at least 2-3 times a day. Replace your drainage bag once a month or sooner if it starts to smell bad or look dirty. Do not clean your drainage bag unless told by your doctor.  Emptying a drainage bag    Supplies Needed   Rubbing alcohol.   Gauze pad or cotton ball.   Tape or a leg strap.    Steps  1. Wash your hands with soap and water.  2. Separate (detach) the bag from your leg.  3. Hold the bag over  the toilet or a clean container. Keep the bag below your hips and bladder. This stops pee (urine) from going back into the tube.  4. Open the pour spout at the bottom of the bag.  5. Empty the pee into the toilet or container. Do not let the pour spout touch any surface.  6. Put rubbing alcohol on a gauze pad or cotton ball.  7. Use the gauze pad or cotton ball to clean the pour spout.  8. Close the pour spout.  9. Attach the bag to your leg with tape or a leg strap.  10. Wash your hands.    Changing a drainage bag  Supplies Needed   Alcohol wipes.   A clean drainage bag.   Adhesive tape or a leg strap.    Steps  1. Wash your hands with soap and water.  2. Separate the dirty bag from your leg.  3. Pinch the rubber catheter with your fingers so that pee does not spill out.  4. Separate the catheter tube from the drainage tube where these tubes connect (at the   connection valve). Do not let the tubes touch any surface.  5. Clean the end of the catheter tube with an alcohol wipe. Use a different alcohol wipe to clean the end of the drainage tube.  6. Connect the catheter tube to the drainage tube of the clean bag.  7. Attach the new bag to the leg with adhesive tape or a leg strap.  8. Wash your hands.    How to prevent infection and other problems   Never pull on your catheter or try to remove it. Pulling can damage tissue in your body.   Always wash your hands before and after touching your catheter.   If a leg strap gets wet, replace it with a dry one.   Drink enough fluids to keep your pee clear or pale yellow, or as told by your doctor.   Do not let the drainage bag or tubing touch the floor.   Wear cotton underwear.   If you are male, wipe from front to back after you poop (have a bowel movement).   Check on the catheter often to make sure it works and the tubing is not twisted.  Get help if:   Your pee is cloudy.   Your pee smells unusually bad.   Your pee is not draining into the bag.   Your  tube gets clogged.   Your catheter starts to leak.   Your bladder feels full.  Get help right away if:   You have redness, swelling, or pain where the catheter enters your body.   You have fluid, pus, or a bad smell coming from the area where the catheter enters your body.   The area where the catheter enters your body feels warm.   You have a fever.   You have pain in your:  ? Stomach (abdomen).  ? Legs.  ? Lower back.  ? Bladder.   You see blood fill the catheter.   Your pee is pink or red.   You feel sick to your stomach (nauseous).   You throw up (vomit).   You have chills.   Your catheter gets pulled out.  This information is not intended to replace advice given to you by your health care provider. Make sure you discuss any questions you have with your health care provider.  Document Released: 12/01/2012 Document Revised: 07/04/2016 Document Reviewed: 01/19/2014  Elsevier Interactive Patient Education  2018 [employer]

## 2017-11-30 NOTE — Progress Notes (Signed)
Procedure note-  Procedure: Flexible cystoscopy, dilation of urethral stricture from 12-22 French, Foley catheter placement  Preprocedure diagnosis: Urinary retention, difficult Foley Post procedure diagnosis: Urinary retention, bulb urethral stricture   Findings: 1 flexible cystoscopy there was a short, narrow bulb urethral stricture of about 5 JamaicaFrench.  Proximal to this the bulb urethra appeared normal, the prostate was short and nonobstructive, the bladder was distended with mild trabeculation.  No stone or foreign body in the bladder.  Description of procedure: I discussed with the patient the nature risks benefits and alternatives to Foley catheter placement and he elected to proceed.  The penis was prepped in the usual sterile fashion. I instilled lidocaine jelly per urethra and let this sit for a few minutes. I gently passed an 6218 JamaicaFrench coud catheter which reached an obstruction in the bulb or prostate.  I discussed with the patient the nature risks benefits and alternatives to flexible cystoscopy and elected to proceed.  The 15 French flexible cystoscope was passed per urethra and there was a tight 5 French stricture in the bulb urethra.  I discussed with the patient the cystoscopic findings and the nature, risks benefits and alternatives to urethral stricture dilation including bleeding, infection, recurrence, incontinence among others.  All questions answered and he elected to proceed.  There were no Hayman dilators on the cystoscopy cart, only filiforms and followers.  I took a 4 JamaicaFrench filiform and this passed without difficulty.  I then passed the flexible cystoscope beside the filiform and noted it traversed the stricture normally.  I then attached the 12 French dilator and passed this into the bladder.  The dilator went easily and normally.  Clear, orange urine drained.  I then sequentially connected the 14 through 22 JamaicaFrench dilators and passed these without difficulty.  I then removed  the filiform and passed the flexible cystoscope without difficulty into the bladder.  I then passed the 2118 JamaicaFrench coud catheter without difficulty.  The balloon was inflated and it was seated at the bladder neck and drained about 1800 mL of clear orange urine.  The patient's abdomen was now flat.  He tolerated the procedures well and conversed the entire time.  Discussed with him importance of follow-up and to notify his caregivers if he notes his bladder is distended in the future.

## 2017-11-30 NOTE — ED Notes (Signed)
Patient transported to CT 

## 2017-12-02 LAB — URINE CULTURE

## 2017-12-11 ENCOUNTER — Ambulatory Visit (INDEPENDENT_AMBULATORY_CARE_PROVIDER_SITE_OTHER): Payer: Medicaid Other | Admitting: Urology

## 2017-12-11 DIAGNOSIS — N401 Enlarged prostate with lower urinary tract symptoms: Secondary | ICD-10-CM

## 2017-12-11 DIAGNOSIS — R338 Other retention of urine: Secondary | ICD-10-CM

## 2018-01-27 ENCOUNTER — Other Ambulatory Visit: Payer: Self-pay | Admitting: Urology

## 2018-01-28 ENCOUNTER — Encounter (HOSPITAL_COMMUNITY): Payer: Self-pay | Admitting: *Deleted

## 2018-01-28 ENCOUNTER — Other Ambulatory Visit: Payer: Self-pay

## 2018-01-28 ENCOUNTER — Emergency Department (HOSPITAL_COMMUNITY)
Admission: EM | Admit: 2018-01-28 | Discharge: 2018-01-28 | Disposition: A | Discharge status: Home / Self Care | Payer: Medicaid Other | Attending: Emergency Medicine | Admitting: Emergency Medicine

## 2018-01-28 DIAGNOSIS — F17210 Nicotine dependence, cigarettes, uncomplicated: Secondary | ICD-10-CM | POA: Insufficient documentation

## 2018-01-28 DIAGNOSIS — I10 Essential (primary) hypertension: Secondary | ICD-10-CM | POA: Insufficient documentation

## 2018-01-28 DIAGNOSIS — T839XXA Unspecified complication of genitourinary prosthetic device, implant and graft, initial encounter: Secondary | ICD-10-CM | POA: Diagnosis present

## 2018-01-28 DIAGNOSIS — Y658 Other specified misadventures during surgical and medical care: Secondary | ICD-10-CM | POA: Insufficient documentation

## 2018-01-28 DIAGNOSIS — Z79899 Other long term (current) drug therapy: Secondary | ICD-10-CM | POA: Diagnosis not present

## 2018-01-28 NOTE — ED Notes (Signed)
Pt's sister called back and this RN informed her of pt's condition and his return to the facility; Ival BibleGina AC called the facility and spoke with staff to state pt is ready for discharge; they stated some is to come get him

## 2018-01-28 NOTE — ED Provider Notes (Signed)
[facility] Provider Note   CSN: [id number] Arrival date & time: 01/28/18  2121     History   Chief Complaint Chief Complaint  Patient presents with  . foley bag replacement    HPI Ray ChadRoy Thomas Margaretmary EddyBoykin Jr. is a 62 y.o. male.  HPI   Ray ChadRoy Thomas Margaretmary EddyBoykin Jr. is a 62 y.o. male who presents to the Emergency Department by EMS from [facility] facility for a change of the patient's foley leg bag.  His current bag has a leak and the facility did not have a replacement.  Patient denies pain or difficulty urinating.  No fever, chills, or abdominal pain.   Past Medical History:  Diagnosis Date  . ARF (acute renal failure) (HCC) 10/02/2017  . Constipation   . Hypertension   . Hypokalemia   . Hyponatremia 10/02/2017  . Noncompliance 11/05/2017  . Schizophrenia [facility](HCC)     Patient Active Problem List   Diagnosis Date Noted  . Schizophrenia (HCC) 11/05/2017  . UTI (urinary tract infection) 10/02/2017  . Anemia 10/02/2017    Past Surgical History:  Procedure Laterality Date  . TONSILLECTOMY       Home Medications    Prior to Admission medications   Medication Sig Start Date End Date Taking? Authorizing Provider  cephALEXin (KEFLEX) 500 MG capsule Take 1 capsule (500 mg total) by mouth every 12 (twelve) hours. Last dose on 4/3 Patient not taking: Reported on 11/30/2017 11/14/17   Haskell RilingMcNew, Holly R, MD  cetirizine (ZYRTEC) 10 MG tablet Take 10 mg by mouth at bedtime.     [provider]  Cholecalciferol (VITAMIN D3) 2000 units capsule Take 2,000 Units by mouth daily.    [provider]  clonazePAM (KLONOPIN) 1 MG tablet Take 1 mg by mouth at bedtime.     [provider]  docusate sodium (COLACE) 100 MG capsule Take 100 mg by mouth 2 (two) times daily.    [provider]  finasteride (PROSCAR) 5 MG tablet Take 5 mg by mouth daily.    [provider]  FLUoxetine (PROZAC) 20 MG tablet Take 20 mg by mouth daily.    [provider]  fluticasone (FLONASE) 50 MCG/ACT nasal spray Place 2 sprays into both nostrils daily.    [provider]  hydrochlorothiazide (HYDRODIURIL) 25 MG tablet Take 25 mg by mouth daily.    [provider]  lactulose (CHRONULAC) 10 GM/15ML solution Take 20 g by mouth 2 (two) times daily.     [provider]  lithium carbonate (ESKALITH) 450 MG CR tablet Take 450 mg by mouth 2 (two) times daily.    [provider]  mirtazapine (REMERON) 30 MG tablet Take 30 mg by mouth at bedtime.    [provider]  paliperidone (INVEGA SUSTENNA) 156 MG/ML SUSP injection Inject 1 mL (156 mg total) into the muscle every 30 (thirty) days. Next dose due on 4/16 11/14/17   McNew, Ileene HutchinsonHolly R, MD  polyethylene glycol (MIRALAX / GLYCOLAX) packet Take 17 g by mouth daily.    [provider]  potassium chloride SA (K-DUR,KLOR-CON) 20 MEQ tablet Take 1 tablet (20 mEq total) by mouth daily. 12/01/17   Loren RacerYelverton, David, MD  risperiDONE (RISPERDAL M-TABS) 1 MG disintegrating tablet Take 1 tablet by mouth at bedtime. 09/12/17   [provider]  risperiDONE (RISPERDAL) 1 MG tablet Take 1 mg by mouth at bedtime.    [provider]  tamsulosin (FLOMAX) 0.4 MG CAPS capsule Take 1  capsule by mouth daily. 09/13/17   [provider]  tiotropium (SPIRIVA) 18 MCG inhalation capsule Place 18 mcg into inhaler and inhale daily.    [provider]  traZODone (DESYREL) 100 MG tablet Take 200 mg by mouth at bedtime.    [provider]  vitamin B-12 (CYANOCOBALAMIN) 1000 MCG tablet Take 1,000 mcg by mouth daily.    [provider]    Family History History reviewed. No pertinent family history.  Social History Social History   Tobacco Use  . Smoking status: Current Every Day Smoker    Packs/day: 0.50  . Smokeless tobacco: Never Used  Substance Use Topics  . Alcohol use: No    Frequency: Never  . Drug use: No      Allergies   Patient has no known allergies.   Review of Systems Review of Systems  Constitutional: Negative for activity change, appetite change and fever.  Respiratory: Negative for shortness of breath.   Cardiovascular: Negative for chest pain.  Gastrointestinal: Negative for abdominal pain, nausea and vomiting.  Genitourinary: Negative for decreased urine volume, difficulty urinating and flank pain.       Foley catheter bag is leaking  Musculoskeletal: Negative for arthralgias and neck pain.  Skin: Negative for rash.     Physical Exam Updated Vital Signs BP (!) 169/97 (BP Location: Right Arm)   Pulse 80   Temp 97.6 F (36.4 C) (Oral)   Resp 18   Ht 6' (1.829 m)   Wt 62.1 kg (137 lb)   SpO2 93%   BMI 18.58 kg/m   Physical Exam  Constitutional: He is oriented to person, place, and time. No distress.  Cardiovascular: Normal rate and regular rhythm.  Pulmonary/Chest: Effort normal and breath sounds normal.  Abdominal: Soft. He exhibits no distension and no mass. There is no tenderness. There is no guarding.  Genitourinary:  Genitourinary Comments: Foley catheter is functional.  Small hole in the leg bag, leaking urine.    Musculoskeletal: Normal range of motion. He exhibits no edema.  Neurological: He is alert and oriented to person, place, and time.  Skin: Skin is warm.  Psychiatric: He has a normal mood and affect.  Nursing note and vitals reviewed.    ED Treatments / Results  Labs (all labs ordered are listed, but only abnormal results are displayed) Labs Reviewed - No data to display  EKG None  Radiology No results found.  Procedures Procedures (including critical care time)  Medications Ordered in ED Medications - No data to display   Initial Impression / Assessment and Plan / ED Course  I have reviewed the triage vital signs and the nursing notes.  Pertinent labs & imaging results that were available during my care of the patient were  reviewed by me and considered in my medical decision making (see chart for details).     Leg bag replaced by nursing staff.  No further leaking.   On recheck, straw colored urine present in the bag.  Pt denies pain.    Final Clinical Impressions(s) / ED Diagnoses   Final diagnoses:  Foley catheter problem, initial encounter [facility])    ED Discharge Orders    None       Pauline Aus, PA-C 01/28/18 2232    Samuel Jester, DO 02/01/18 2338

## 2018-01-28 NOTE — Discharge Instructions (Addendum)
Return if needed.  Your foley catheter bag has been replaced.

## 2018-01-28 NOTE — ED Triage Notes (Signed)
[facility] replacement; pt has a hole in the current bag and the facility does not have a replacement

## 2018-02-19 NOTE — Patient Instructions (Signed)
Ray Jackson Ray Jackson.  02/19/2018     @PREFPERIOPPHARMACY @   Your procedure is scheduled on  03/03/2018   Report to [facility] at  800   A.M.  Call this number if you have problems the morning of surgery:  [phone]   Remember:  Do not eat or drink after midnight.  You may drink clear liquids until  12 midnight 03/02/2018 .  Clear liquids allowed are:                    Water, Juice (non-citric and without pulp), Carbonated beverages, Clear Tea, Black Coffee only, Plain Jell-O only, Gatorade and Plain Popsicles only    Take these medicines the morning of surgery with A SIP OF WATER  Proscar, prozac, lithium, flomax. Use your spiriva before you come.    Do not wear jewelry, make-up or nail polish.  Do not wear lotions, powders, or perfumes, or deodorant.  Do not shave 48 hours prior to surgery.  Men may shave face and neck.  Do not bring valuables to the hospital.  [facility] is not responsible for any belongings or valuables.  Contacts, dentures or bridgework may not be worn into surgery.  Leave your suitcase in the car.  After surgery it may be brought to your room.  For patients admitted to the hospital, discharge time will be determined by your treatment team.  Patients discharged the day of surgery will not be allowed to drive home.   Name and phone number of your driver:   family Special instructions:  None  Please read over the following fact sheets that you were given. Anesthesia Post-op Instructions and Care and Recovery After Surgery       Cystoscopy Cystoscopy is a procedure that is used to help diagnose and sometimes treat conditions that affect that lower urinary tract. The lower urinary tract includes the bladder and the tube that drains urine from the bladder out of the body (urethra). Cystoscopy is performed with a thin, tube-shaped instrument with a light and camera at the end (cystoscope). The cystoscope may be hard (rigid) or flexible, depending on  the goal of the procedure.The cystoscope is inserted through the urethra, into the bladder. Cystoscopy may be recommended if you have:  Urinary tractinfections that keep coming back (recurring).  Blood in the urine (hematuria).  Loss of bladder control (urinary incontinence) or an overactive bladder.  Unusual cells found in a urine sample.  A blockage in the urethra.  Painful urination.  An abnormality in the bladder found during an intravenous pyelogram (IVP) or CT scan.  Cystoscopy may also be done to remove a sample of tissue to be examined under a microscope (biopsy). Tell a health care provider about:  Any allergies you have.  All medicines you are taking, including vitamins, herbs, eye drops, creams, and over-the-counter medicines.  Any problems you or family members have had with anesthetic medicines.  Any blood disorders you have.  Any surgeries you have had.  Any medical conditions you have.  Whether you are pregnant or may be pregnant. What are the risks? Generally, this is a safe procedure. However, problems may occur, including:  Infection.  Bleeding.  Allergic reactions to medicines.  Damage to other structures or organs.  What happens before the procedure?  Ask your health care provider about: ? Changing or stopping your regular medicines. This is especially important if you are taking diabetes medicines or blood thinners. ?  Taking medicines such as aspirin and ibuprofen. These medicines can thin your blood. Do not take these medicines before your procedure if your health care provider instructs you not to.  Follow instructions from your health care provider about eating or drinking restrictions.  You may be given antibiotic medicine to help prevent infection.  You may have an exam or testing, such as X-rays of the bladder, urethra, or kidneys.  You may have urine tests to check for signs of infection.  Plan to have someone take you home after  the procedure. What happens during the procedure?  To reduce your risk of infection,your health care team will wash or sanitize their hands.  You will be given one or more of the following: ? A medicine to help you relax (sedative). ? A medicine to numb the area (local anesthetic).  The area around the opening of your urethra will be cleaned.  The cystoscope will be passed through your urethra into your bladder.  Germ-free (sterile)fluid will flow through the cystoscope to fill your bladder. The fluid will stretch your bladder so that your surgeon can clearly examine your bladder walls.  The cystoscope will be removed and your bladder will be emptied. The procedure may vary among health care providers and hospitals. What happens after the procedure?  You may have some soreness or pain in your abdomen and urethra. Medicines will be available to help you.  You may have some blood in your urine.  Do not drive for 24 hours if you received a sedative. This information is not intended to replace advice given to you by your health care provider. Make sure you discuss any questions you have with your health care provider. Document Released: 08/03/2000 Document Revised: 12/15/2015 Document Reviewed: 06/23/2015 Elsevier Interactive Patient Education  2018 [employer]. Cystoscopy, Care After Refer to this sheet in the next few weeks. These instructions provide you with information about caring for yourself after your procedure. Your health care provider may also give you more specific instructions. Your treatment has been planned according to current medical practices, but problems sometimes occur. Call your health care provider if you have any problems or questions after your procedure. What can I expect after the procedure? After the procedure, it is common to have:  Mild pain when you urinate. Pain should stop within a few minutes after you urinate. This may last for up to 1 week.  A  small amount of blood in your urine for several days.  Feeling like you need to urinate but producing only a small amount of urine.  Follow these instructions at home:  Medicines  Take over-the-counter and prescription medicines only as told by your health care provider.  If you were prescribed an antibiotic medicine, take it as told by your health care provider. Do not stop taking the antibiotic even if you start to feel better. General instructions   Return to your normal activities as told by your health care provider. Ask your health care provider what activities are safe for you.  Do not drive for 24 hours if you received a sedative.  Watch for any blood in your urine. If the amount of blood in your urine increases, call your health care provider.  Follow instructions from your health care provider about eating or drinking restrictions.  If a tissue sample was removed for testing (biopsy) during your procedure, it is your responsibility to get your test results. Ask your health care provider or the department performing the  test when your results will be ready.  Drink enough fluid to keep your urine clear or pale yellow.  Keep all follow-up visits as told by your health care provider. This is important. Contact a health care provider if:  You have pain that gets worse or does not get better with medicine, especially pain when you urinate.  You have difficulty urinating. Get help right away if:  You have more blood in your urine.  You have blood clots in your urine.  You have abdominal pain.  You have a fever or chills.  You are unable to urinate. This information is not intended to replace advice given to you by your health care provider. Make sure you discuss any questions you have with your health care provider. Document Released: 02/23/2005 Document Revised: 01/12/2016 Document Reviewed: 06/23/2015 Elsevier Interactive Patient Education  [employer]2018 [employer]

## 2018-02-25 ENCOUNTER — Encounter (HOSPITAL_COMMUNITY)
Admission: RE | Admit: 2018-02-25 | Discharge: 2018-02-25 | Disposition: A | Discharge status: Home / Self Care | Payer: Medicaid Other | Source: Ambulatory Visit | Attending: Urology | Admitting: Urology

## 2018-02-26 NOTE — Patient Instructions (Signed)
Your procedure is scheduled on: 03/03/2018  [facility] at 8:00    AM.  Call this number if you have problems the morning of surgery: [phone]   Remember:   Do not drink or eat food:After Midnight.  :  Take these medicines the morning of surgery with A SIP OF WATER: Prozac, Lithium Spiriva and Flowmax   Do not wear jewelry, make-up or nail polish.  Do not wear lotions, powders, or perfumes. You may wear deodorant.  Do not shave 48 hours prior to surgery. Men may shave face and neck.  Do not bring valuables to the hospital.  Contacts, dentures or bridgework may not be worn into surgery.  Leave suitcase in the car. After surgery it may be brought to your room.  For patients admitted to the hospital, checkout time is 11:00 AM the day of discharge.   Patients discharged the day of surgery will not be allowed to drive home.    Special Instructions: Shower using CHG night before surgery and shower the day of surgery use CHG.  Use special wash - you have one bottle of CHG for all showers.  You should use approximately 1/2 of the bottle for each shower.  Cystoscopy, Care After Refer to this sheet in the next few weeks. These instructions provide you with information about caring for yourself after your procedure. Your health care provider may also give you more specific instructions. Your treatment has been planned according to current medical practices, but problems sometimes occur. Call your health care provider if you have any problems or questions after your procedure. What can I expect after the procedure? After the procedure, it is common to have:  Mild pain when you urinate. Pain should stop within a few minutes after you urinate. This may last for up to 1 week.  A small amount of blood in your urine for several days.  Feeling like you need to urinate but producing only a small amount of urine.  Follow these instructions at home:  Medicines  Take over-the-counter  and prescription medicines only as told by your health care provider.  If you were prescribed an antibiotic medicine, take it as told by your health care provider. Do not stop taking the antibiotic even if you start to feel better. General instructions   Return to your normal activities as told by your health care provider. Ask your health care provider what activities are safe for you.  Do not drive for 24 hours if you received a sedative.  Watch for any blood in your urine. If the amount of blood in your urine increases, call your health care provider.  Follow instructions from your health care provider about eating or drinking restrictions.  If a tissue sample was removed for testing (biopsy) during your procedure, it is your responsibility to get your test results. Ask your health care provider or the department performing the test when your results will be ready.  Drink enough fluid to keep your urine clear or pale yellow.  Keep all follow-up visits as told by your health care provider. This is important. Contact a health care provider if:  You have pain that gets worse or does not get better with medicine, especially pain when you urinate.  You have difficulty urinating. Get help right away if:  You have more blood in your urine.  You have blood clots in your urine.  You have abdominal pain.  You have a fever or chills.  You  are unable to urinate. This information is not intended to replace advice given to you by your health care provider. Make sure you discuss any questions you have with your health care provider. Document Released: 02/23/2005 Document Revised: 01/12/2016 Document Reviewed: 06/23/2015 Elsevier Interactive Patient Education  2018 [employer]. General Anesthesia, Adult, Care After These instructions provide you with information about caring for yourself after your procedure. Your health care provider may also give you more specific instructions. Your  treatment has been planned according to current medical practices, but problems sometimes occur. Call your health care provider if you have any problems or questions after your procedure. What can I expect after the procedure? After the procedure, it is common to have:  Vomiting.  A sore throat.  Mental slowness.  It is common to feel:  Nauseous.  Cold or shivery.  Sleepy.  Tired.  Sore or achy, even in parts of your body where you did not have surgery.  Follow these instructions at home: For at least 24 hours after the procedure:  Do not: ? Participate in activities where you could fall or become injured. ? Drive. ? Use heavy machinery. ? Drink alcohol. ? Take sleeping pills or medicines that cause drowsiness. ? Make important decisions or sign legal documents. ? Take care of children on your own.  Rest. Eating and drinking  If you vomit, drink water, juice, or soup when you can drink without vomiting.  Drink enough fluid to keep your urine clear or pale yellow.  Make sure you have little or no nausea before eating solid foods.  Follow the diet recommended by your health care provider. General instructions  Have a responsible adult stay with you until you are awake and alert.  Return to your normal activities as told by your health care provider. Ask your health care provider what activities are safe for you.  Take over-the-counter and prescription medicines only as told by your health care provider.  If you smoke, do not smoke without supervision.  Keep all follow-up visits as told by your health care provider. This is important. Contact a health care provider if:  You continue to have nausea or vomiting at home, and medicines are not helpful.  You cannot drink fluids or start eating again.  You cannot urinate after 8-12 hours.  You develop a skin rash.  You have fever.  You have increasing redness at the site of your procedure. Get help right  away if:  You have difficulty breathing.  You have chest pain.  You have unexpected bleeding.  You feel that you are having a life-threatening or urgent problem. This information is not intended to replace advice given to you by your health care provider. Make sure you discuss any questions you have with your health care provider. Document Released: 11/12/2000 Document Revised: 01/09/2016 Document Reviewed: 07/21/2015 Elsevier Interactive Patient Education  [employer].

## 2018-02-27 ENCOUNTER — Encounter (HOSPITAL_COMMUNITY)
Admission: RE | Admit: 2018-02-27 | Discharge: 2018-02-27 | Disposition: A | Discharge status: Home / Self Care | Payer: Medicaid Other | Source: Ambulatory Visit | Attending: Urology | Admitting: Urology

## 2018-02-27 ENCOUNTER — Encounter (HOSPITAL_COMMUNITY): Payer: Self-pay

## 2018-02-27 ENCOUNTER — Other Ambulatory Visit: Payer: Self-pay

## 2018-02-27 DIAGNOSIS — R339 Retention of urine, unspecified: Secondary | ICD-10-CM | POA: Insufficient documentation

## 2018-02-27 DIAGNOSIS — Z01818 Encounter for other preprocedural examination: Secondary | ICD-10-CM | POA: Diagnosis not present

## 2018-02-27 LAB — CBC
HCT: 41.2 % (ref 39.0–52.0)
Hemoglobin: 13.1 g/dL (ref 13.0–17.0)
MCH: 31 pg (ref 26.0–34.0)
MCHC: 31.8 g/dL (ref 30.0–36.0)
MCV: 97.4 fL (ref 78.0–100.0)
PLATELETS: 280 10*3/uL (ref 150–400)
RBC: 4.23 MIL/uL (ref 4.22–5.81)
RDW: 15.5 % (ref 11.5–15.5)
WBC: 10.7 10*3/uL — AB (ref 4.0–10.5)

## 2018-02-27 LAB — BASIC METABOLIC PANEL
ANION GAP: 9 (ref 5–15)
BUN: 16 mg/dL (ref 8–23)
CO2: 35 mmol/L — ABNORMAL HIGH (ref 22–32)
Calcium: 9.9 mg/dL (ref 8.9–10.3)
Chloride: 96 mmol/L — ABNORMAL LOW (ref 98–111)
Creatinine, Ser: 1.34 mg/dL — ABNORMAL HIGH (ref 0.61–1.24)
GFR calc Af Amer: >60 mL/min (ref >60)
GFR, EST NON AFRICAN AMERICAN: 55 mL/min — AB (ref >60)
GLUCOSE: 135 mg/dL — AB (ref 70–99)
POTASSIUM: 3.4 mmol/L — AB (ref 3.5–5.1)
SODIUM: 140 mmol/L (ref 135–145)

## 2018-03-03 ENCOUNTER — Other Ambulatory Visit: Payer: Self-pay | Admitting: Urology

## 2018-03-03 NOTE — OR Nursing (Signed)
[facility]  Called and stated that they are not going to be able to  Make this 0800 appointment, the earliest the can be here is at 0930.  Dr. Ronne BinningmcKenzie notified. He request to move patient to another day. Alcario Droughtrica called back informed of Dr. Ronne BinningMcKenzie request. And appropriate arrangements made with our scheduler Carloyn Jaegerarolyn Young. Also OR notified of issue that case will be moved to another day.

## 2018-03-05 ENCOUNTER — Other Ambulatory Visit (HOSPITAL_COMMUNITY): Payer: Medicaid Other

## 2018-03-07 ENCOUNTER — Encounter (HOSPITAL_COMMUNITY): Payer: Self-pay | Admitting: Emergency Medicine

## 2018-03-07 ENCOUNTER — Other Ambulatory Visit: Payer: Self-pay

## 2018-03-07 ENCOUNTER — Emergency Department (HOSPITAL_COMMUNITY)
Admission: EM | Admit: 2018-03-07 | Discharge: 2018-03-07 | Disposition: A | Discharge status: Home / Self Care | Payer: Medicaid Other | Attending: Emergency Medicine | Admitting: Emergency Medicine

## 2018-03-07 DIAGNOSIS — Y69 Unspecified misadventure during surgical and medical care: Secondary | ICD-10-CM | POA: Diagnosis not present

## 2018-03-07 DIAGNOSIS — F17290 Nicotine dependence, other tobacco product, uncomplicated: Secondary | ICD-10-CM | POA: Insufficient documentation

## 2018-03-07 DIAGNOSIS — Z79899 Other long term (current) drug therapy: Secondary | ICD-10-CM | POA: Insufficient documentation

## 2018-03-07 DIAGNOSIS — T839XXA Unspecified complication of genitourinary prosthetic device, implant and graft, initial encounter: Secondary | ICD-10-CM

## 2018-03-07 DIAGNOSIS — I10 Essential (primary) hypertension: Secondary | ICD-10-CM | POA: Diagnosis not present

## 2018-03-07 DIAGNOSIS — T83098A Other mechanical complication of other indwelling urethral catheter, initial encounter: Secondary | ICD-10-CM | POA: Insufficient documentation

## 2018-03-07 DIAGNOSIS — F209 Schizophrenia, unspecified: Secondary | ICD-10-CM | POA: Diagnosis not present

## 2018-03-07 NOTE — ED Triage Notes (Signed)
Pt states that his catheter is leaking

## 2018-03-07 NOTE — ED Notes (Signed)
Leg bag changed per VO

## 2018-03-07 NOTE — Discharge Instructions (Addendum)
Follow-up with your urologist as scheduled.  Return to the ER if needed.

## 2018-03-09 NOTE — ED Provider Notes (Signed)
[facility] EMERGENCY DEPARTMENT Provider Note   CSN: [id number] Arrival date & time: 03/07/18  1034     History   Chief Complaint Chief Complaint  Patient presents with  . foley catheter problem    HPI Ray ChadRoy Thomas Margaretmary EddyBoykin Jr. is a 62 y.o. male.  HPI  Ray ChadRoy Thomas Margaretmary EddyBoykin Jr. is a 62 y.o. male who presents to the Emergency Department requesting evaluation of his foley catheter leg bag.  He states the bag has been leaking for 2-3 days.  He states the bag is leaking from the bottom.  Foley was last changed 2-3 months ago.  He denies pain or complications at the insertion.  Urine draining well.  He is scheduled to have prostate surgery on Monday.  Denies abdominal pain, pressure, fever, chills or back pain.    Past Medical History:  Diagnosis Date  . ARF (acute renal failure) (HCC) 10/02/2017  . Constipation   . Hypertension   . Hypokalemia   . Hyponatremia 10/02/2017  . Noncompliance 11/05/2017  . Schizophrenia [facility](HCC)     Patient Active Problem List   Diagnosis Date Noted  . Schizophrenia (HCC) 11/05/2017  . UTI (urinary tract infection) 10/02/2017  . Anemia 10/02/2017    Past Surgical History:  Procedure Laterality Date  . TONSILLECTOMY        Home Medications    Prior to Admission medications   Medication Sig Start Date End Date Taking? Authorizing Provider  cetirizine (ZYRTEC) 10 MG tablet Take 10 mg by mouth at bedtime.     [provider]  Cholecalciferol (VITAMIN D3) 2000 units capsule Take 2,000 Units by mouth daily.    [provider]  clonazePAM (KLONOPIN) 1 MG tablet Take 1 mg by mouth at bedtime.     [provider]  docusate sodium (COLACE) 100 MG capsule Take 100 mg by mouth 2 (two) times daily.    [provider]  finasteride (PROSCAR) 5 MG tablet Take 5 mg by mouth daily.    [provider]  FLUoxetine (PROZAC) 20 MG tablet Take 20 mg by mouth daily.    [provider]  fluticasone (FLONASE) 50  MCG/ACT nasal spray Place 2 sprays into both nostrils daily.    [provider]  hydrochlorothiazide (HYDRODIURIL) 25 MG tablet Take 25 mg by mouth daily.    [provider]  lactulose (CHRONULAC) 10 GM/15ML solution Take 20 g by mouth 2 (two) times daily.     [provider]  lithium carbonate (ESKALITH) 450 MG CR tablet Take 450 mg by mouth 2 (two) times daily.    [provider]  mirtazapine (REMERON) 30 MG tablet Take 30 mg by mouth at bedtime.    [provider]  paliperidone (INVEGA SUSTENNA) 156 MG/ML SUSP injection Inject 1 mL (156 mg total) into the muscle every 30 (thirty) days. Next dose due on 4/16 11/14/17   McNew, Ileene HutchinsonHolly R, MD  polyethylene glycol (MIRALAX / GLYCOLAX) packet Take 17 g by mouth daily.    [provider]  risperiDONE (RISPERDAL M-TABS) 1 MG disintegrating tablet Take 1 tablet by mouth at bedtime. 09/12/17   [provider]  tamsulosin (FLOMAX) 0.4 MG CAPS capsule Take 0.4 mg by mouth daily.  09/13/17   [provider]  traZODone (DESYREL) 100 MG tablet Take 200 mg by mouth at bedtime.    [provider]  vitamin B-12 (CYANOCOBALAMIN) 1000 MCG tablet Take 1,000 mcg by mouth daily.    [provider]  Family History No family history on file.  Social History Social History   Tobacco Use  . Smoking status: Current Every Day Smoker    Packs/day: 0.50  . Smokeless tobacco: Never Used  Substance Use Topics  . Alcohol use: No    Frequency: Never  . Drug use: No     Allergies   Patient has no known allergies.   Review of Systems Review of Systems  Constitutional: Negative for chills, fatigue and fever.  Respiratory: Negative for cough, shortness of breath and wheezing.   Cardiovascular: Negative for chest pain.  Gastrointestinal: Negative for abdominal pain, nausea and vomiting.  Genitourinary: Negative for decreased urine volume, dysuria, flank pain, hematuria, penile  pain, penile swelling, scrotal swelling and testicular pain.  Musculoskeletal: Negative for arthralgias, back pain and myalgias.  Skin: Negative for rash.  Neurological: Negative for dizziness, weakness and numbness.  Hematological: Does not bruise/bleed easily.     Physical Exam Updated Vital Signs BP (!) 176/106 (BP Location: Right Arm)   Pulse 69   Temp 98.3 F (36.8 C) (Oral)   Resp 16   Ht 6' (1.829 m)   Wt 62.1 kg (137 lb)   SpO2 98%   BMI 18.58 kg/m   Physical Exam  Constitutional: He appears well-developed and well-nourished. No distress.  HENT:  Head: Atraumatic.  Mouth/Throat: Oropharynx is clear and moist.  Cardiovascular: Normal rate, regular rhythm and intact distal pulses.  Pulmonary/Chest: Effort normal and breath sounds normal. No respiratory distress.  Abdominal: Soft. He exhibits no distension and no mass. There is no tenderness. There is no guarding.  Genitourinary:  Genitourinary Comments: Catheter in place with leg bag attached, small amt of urine leaking from the bottom.  No leaking at insertion.  Straw colored urine in the bag.    Musculoskeletal: Normal range of motion. He exhibits no edema.  Neurological: He is alert. No sensory deficit.  Skin: Skin is warm. No rash noted.  Psychiatric: He has a normal mood and affect.  Nursing note and vitals reviewed.    ED Treatments / Results  Labs (all labs ordered are listed, but only abnormal results are displayed) Labs Reviewed - No data to display  EKG None  Radiology No results found.  Procedures Procedures (including critical care time)  Medications Ordered in ED Medications - No data to display   Initial Impression / Assessment and Plan / ED Course  I have reviewed the triage vital signs and the nursing notes.  Pertinent labs & imaging results that were available during my care of the patient were reviewed by me and considered in my medical decision making (see chart for details).      Leg bag switched out by nursing staff.  Pt observed and no further leaking.  Pt's guardian notified.    Final Clinical Impressions(s) / ED Diagnoses   Final diagnoses:  Foley catheter problem, initial encounter [facility])    ED Discharge Orders    None       Pauline Aus, PA-C 03/09/18 0912    Samuel Jester, DO 03/09/18 1818

## 2018-03-18 ENCOUNTER — Encounter (HOSPITAL_COMMUNITY): Payer: Self-pay

## 2018-03-18 ENCOUNTER — Encounter (HOSPITAL_COMMUNITY)
Admission: RE | Admit: 2018-03-18 | Discharge: 2018-03-18 | Disposition: A | Discharge status: Home / Self Care | Payer: Medicaid Other | Source: Ambulatory Visit | Attending: Urology | Admitting: Urology

## 2018-03-24 ENCOUNTER — Encounter (HOSPITAL_COMMUNITY)
Admission: RE | Disposition: A | Discharge status: Home / Self Care | Payer: Self-pay | Source: Ambulatory Visit | Attending: Urology

## 2018-03-24 ENCOUNTER — Encounter (HOSPITAL_COMMUNITY): Payer: Self-pay

## 2018-03-24 ENCOUNTER — Ambulatory Visit (HOSPITAL_COMMUNITY)
Admission: RE | Admit: 2018-03-24 | Discharge: 2018-03-24 | Disposition: A | Discharge status: Home / Self Care | Payer: Medicaid Other | Source: Ambulatory Visit | Attending: Urology | Admitting: Urology

## 2018-03-24 ENCOUNTER — Ambulatory Visit (HOSPITAL_COMMUNITY): Payer: Medicaid Other | Admitting: Anesthesiology

## 2018-03-24 ENCOUNTER — Other Ambulatory Visit: Payer: Self-pay

## 2018-03-24 DIAGNOSIS — E876 Hypokalemia: Secondary | ICD-10-CM | POA: Diagnosis not present

## 2018-03-24 DIAGNOSIS — F17210 Nicotine dependence, cigarettes, uncomplicated: Secondary | ICD-10-CM | POA: Diagnosis not present

## 2018-03-24 DIAGNOSIS — N138 Other obstructive and reflux uropathy: Secondary | ICD-10-CM | POA: Insufficient documentation

## 2018-03-24 DIAGNOSIS — R338 Other retention of urine: Secondary | ICD-10-CM | POA: Insufficient documentation

## 2018-03-24 DIAGNOSIS — N401 Enlarged prostate with lower urinary tract symptoms: Secondary | ICD-10-CM | POA: Insufficient documentation

## 2018-03-24 DIAGNOSIS — Z79899 Other long term (current) drug therapy: Secondary | ICD-10-CM | POA: Diagnosis not present

## 2018-03-24 DIAGNOSIS — I10 Essential (primary) hypertension: Secondary | ICD-10-CM | POA: Insufficient documentation

## 2018-03-24 DIAGNOSIS — F209 Schizophrenia, unspecified: Secondary | ICD-10-CM | POA: Diagnosis not present

## 2018-03-24 DIAGNOSIS — N320 Bladder-neck obstruction: Secondary | ICD-10-CM

## 2018-03-24 HISTORY — PX: CYSTOSCOPY WITH INSERTION OF UROLIFT: SHX6678

## 2018-03-24 HISTORY — DX: Dyspnea, unspecified: R06.00

## 2018-03-24 SURGERY — CYSTOSCOPY WITH INSERTION OF UROLIFT
Anesthesia: Monitor Anesthesia Care

## 2018-03-24 MED ORDER — LACTATED RINGERS IV SOLN: INTRAVENOUS | Status: DC

## 2018-03-24 MED ORDER — PROPOFOL 10 MG/ML IV BOLUS
INTRAVENOUS | Status: DC | PRN
  Administered 2018-03-24 (×2): 20 mg via INTRAVENOUS

## 2018-03-24 MED ORDER — LACTATED RINGERS IV SOLN
INTRAVENOUS | Status: DC | PRN
  Administered 2018-03-24: 10:00:00 via INTRAVENOUS

## 2018-03-24 MED ORDER — FENTANYL CITRATE (PF) 100 MCG/2ML IJ SOLN
INTRAMUSCULAR | Status: AC
  Filled 2018-03-24: qty 2

## 2018-03-24 MED ORDER — STERILE WATER FOR IRRIGATION IR SOLN
Status: DC | PRN
  Administered 2018-03-24: 1000 mL

## 2018-03-24 MED ORDER — MIDAZOLAM HCL 5 MG/5ML IJ SOLN
INTRAMUSCULAR | Status: DC | PRN
  Administered 2018-03-24 (×2): 1 mg via INTRAVENOUS

## 2018-03-24 MED ORDER — CEFAZOLIN SODIUM-DEXTROSE 2-4 GM/100ML-% IV SOLN
INTRAVENOUS | Status: AC
  Filled 2018-03-24: qty 100

## 2018-03-24 MED ORDER — MIDAZOLAM HCL 2 MG/2ML IJ SOLN
INTRAMUSCULAR | Status: AC
  Filled 2018-03-24: qty 2

## 2018-03-24 MED ORDER — LIDOCAINE HCL 2 % EX GEL
CUTANEOUS | Status: DC | PRN
  Administered 2018-03-24: 1 via URETHRAL

## 2018-03-24 MED ORDER — SODIUM CHLORIDE 0.9 % IR SOLN
Status: DC | PRN
  Administered 2018-03-24: 3000 mL via INTRAVESICAL

## 2018-03-24 MED ORDER — TRAMADOL HCL 50 MG PO TABS: 50.0000 mg | ORAL_TABLET | Freq: Four times a day (QID) | ORAL | 0 refills | Status: AC | PRN

## 2018-03-24 MED ORDER — LIDOCAINE HCL URETHRAL/MUCOSAL 2 % EX GEL
CUTANEOUS | Status: AC
  Filled 2018-03-24: qty 10

## 2018-03-24 MED ORDER — CEFAZOLIN SODIUM-DEXTROSE 2-4 GM/100ML-% IV SOLN: 2.0000 g | INTRAVENOUS | Status: DC

## 2018-03-24 MED ORDER — CEFAZOLIN SODIUM-DEXTROSE 2-4 GM/100ML-% IV SOLN
2.0000 g | INTRAVENOUS | Status: AC
  Administered 2018-03-24: 2 g via INTRAVENOUS

## 2018-03-24 MED ORDER — PROPOFOL 500 MG/50ML IV EMUL
INTRAVENOUS | Status: DC | PRN
  Administered 2018-03-24: 100 ug/kg/min via INTRAVENOUS

## 2018-03-24 SURGICAL SUPPLY — 21 items
BAG DRAIN URO TABLE W/ADPT NS (BAG) ×3 IMPLANT
CLOTH BEACON ORANGE TIMEOUT ST (SAFETY) ×3 IMPLANT
GLOVE BIO SURGEON STRL SZ8 (GLOVE) ×3 IMPLANT
GLOVE BIOGEL PI IND STRL 6.5 (GLOVE) ×1 IMPLANT
GLOVE BIOGEL PI IND STRL 7.0 (GLOVE) ×2 IMPLANT
GLOVE BIOGEL PI INDICATOR 6.5 (GLOVE) ×2
GLOVE BIOGEL PI INDICATOR 7.0 (GLOVE) ×4
GLOVE ECLIPSE 6.5 STRL STRAW (GLOVE) ×3 IMPLANT
GOWN STRL REUS W/TWL LRG LVL3 (GOWN DISPOSABLE) ×3 IMPLANT
GOWN STRL REUS W/TWL XL LVL3 (GOWN DISPOSABLE) ×3 IMPLANT
IV NS IRRIG 3000ML ARTHROMATIC (IV SOLUTION) ×3 IMPLANT
KIT TURNOVER CYSTO (KITS) ×3 IMPLANT
MANIFOLD NEPTUNE II (INSTRUMENTS) ×3 IMPLANT
PACK CYSTO (CUSTOM PROCEDURE TRAY) ×3 IMPLANT
PAD ARMBOARD 7.5X6 YLW CONV (MISCELLANEOUS) ×3 IMPLANT
SYSTEM UROLIFT (Male Continence) ×12 IMPLANT
TOWEL OR 17X26 4PK STRL BLUE (TOWEL DISPOSABLE) ×3 IMPLANT
TRAY FOLEY W/BAG SLVR 16FR (SET/KITS/TRAYS/PACK) ×2
TRAY FOLEY W/BAG SLVR 16FR ST (SET/KITS/TRAYS/PACK) ×1 IMPLANT
WATER STERILE IRR 1000ML POUR (IV SOLUTION) ×3 IMPLANT
WATER STERILE IRR 500ML POUR (IV SOLUTION) ×3 IMPLANT

## 2018-03-24 NOTE — Transfer of Care (Signed)
Immediate Anesthesia Transfer of Care Note  Patient: Ray Jackson.  Procedure(s) Performed: CYSTOSCOPY WITH INSERTION OF UROLIFT (N/A )  Patient Location: PACU  Anesthesia Type:MAC  Level of Consciousness: awake, alert  and oriented  Airway & Oxygen Therapy: Patient Spontanous Breathing  Post-op Assessment: Report given to RN  Post vital signs: Reviewed  Last Vitals:  Vitals Value Taken Time  BP 153/93 03/24/2018 11:32 AM  Temp    Pulse 59 03/24/2018 11:34 AM  Resp 12 03/24/2018 11:34 AM  SpO2 99 % 03/24/2018 11:34 AM  Vitals shown include unvalidated device data.  Last Pain:  Vitals:   03/24/18 0953  TempSrc: Oral  PainSc: 0-No pain      Patients Stated Pain Goal: 6 (37/10/62 6948)  Complications: No apparent anesthesia complications

## 2018-03-24 NOTE — Discharge Instructions (Signed)
Indwelling Urinary Catheter Care, Adult  Take good care of your catheter to keep it working and to prevent problems.  How to wear your catheter  Attach your catheter to your leg with tape (adhesive tape) or a leg strap. Make sure it is not too tight. If you use tape, remove any bits of tape that are already on the catheter.  How to wear a drainage bag  You should have:   A large overnight bag.   A small leg bag.    Overnight Bag  You may wear the overnight bag at any time. Always keep the bag below the level of your bladder but off the floor. When you sleep, put a clean plastic bag in a wastebasket. Then hang the bag inside the wastebasket.  Leg Bag  Never wear the leg bag at night. Always wear the leg bag below your knee. Keep the leg bag secure with a leg strap or tape.  How to care for your skin   Clean the skin around the catheter at least once every day.   Shower every day. Do not take baths.   Put creams, lotions, or ointments on your genital area only as told by your doctor.   Do not use powders, sprays, or lotions on your genital area.  How to clean your catheter and your skin  1. Wash your hands with soap and water.  2. Wet a washcloth in warm water and gentle (mild) soap.  3. Use the washcloth to clean the skin where the catheter enters your body. Clean downward and wipe away from the catheter in small circles. Do not wipe toward the catheter.  4. Pat the area dry with a clean towel. Make sure to clean off all soap.  How to care for your drainage bags  Empty your drainage bag when it is ?- full or at least 2-3 times a day. Replace your drainage bag once a month or sooner if it starts to smell bad or look dirty. Do not clean your drainage bag unless told by your doctor.  Emptying a drainage bag    Supplies Needed   Rubbing alcohol.   Gauze pad or cotton ball.   Tape or a leg strap.    Steps  1. Wash your hands with soap and water.  2. Separate (detach) the bag from your leg.  3. Hold the bag over  the toilet or a clean container. Keep the bag below your hips and bladder. This stops pee (urine) from going back into the tube.  4. Open the pour spout at the bottom of the bag.  5. Empty the pee into the toilet or container. Do not let the pour spout touch any surface.  6. Put rubbing alcohol on a gauze pad or cotton ball.  7. Use the gauze pad or cotton ball to clean the pour spout.  8. Close the pour spout.  9. Attach the bag to your leg with tape or a leg strap.  10. Wash your hands.    Changing a drainage bag  Supplies Needed   Alcohol wipes.   A clean drainage bag.   Adhesive tape or a leg strap.    Steps  1. Wash your hands with soap and water.  2. Separate the dirty bag from your leg.  3. Pinch the rubber catheter with your fingers so that pee does not spill out.  4. Separate the catheter tube from the drainage tube where these tubes connect (at the   connection valve). Do not let the tubes touch any surface.  5. Clean the end of the catheter tube with an alcohol wipe. Use a different alcohol wipe to clean the end of the drainage tube.  6. Connect the catheter tube to the drainage tube of the clean bag.  7. Attach the new bag to the leg with adhesive tape or a leg strap.  8. Wash your hands.    How to prevent infection and other problems   Never pull on your catheter or try to remove it. Pulling can damage tissue in your body.   Always wash your hands before and after touching your catheter.   If a leg strap gets wet, replace it with a dry one.   Drink enough fluids to keep your pee clear or pale yellow, or as told by your doctor.   Do not let the drainage bag or tubing touch the floor.   Wear cotton underwear.   If you are male, wipe from front to back after you poop (have a bowel movement).   Check on the catheter often to make sure it works and the tubing is not twisted.  Get help if:   Your pee is cloudy.   Your pee smells unusually bad.   Your pee is not draining into the bag.   Your  tube gets clogged.   Your catheter starts to leak.   Your bladder feels full.  Get help right away if:   You have redness, swelling, or pain where the catheter enters your body.   You have fluid, pus, or a bad smell coming from the area where the catheter enters your body.   The area where the catheter enters your body feels warm.   You have a fever.   You have pain in your:  ? Stomach (abdomen).  ? Legs.  ? Lower back.  ? Bladder.   You see blood fill the catheter.   Your pee is pink or red.   You feel sick to your stomach (nauseous).   You throw up (vomit).   You have chills.   Your catheter gets pulled out.  This information is not intended to replace advice given to you by your health care provider. Make sure you discuss any questions you have with your health care provider.  Document Released: 12/01/2012 Document Revised: 07/04/2016 Document Reviewed: 01/19/2014  Elsevier Interactive Patient Education  2018 [employer]

## 2018-03-24 NOTE — H&P (Signed)
Urology Admission H&P  Chief Complaint: incomplete bladder emptying  History of Present Illness: Mr [name] is a 62yo with a hx of BPH who has failed medical therapy. He has severe urgency, frequency and incomplete bladder emptying. He denies any fevers/chills/sweats  Past Medical History:  Diagnosis Date  . ARF (acute renal failure) (HCC) 10/02/2017  . Constipation   . Dyspnea   . Hypertension   . Hypokalemia   . Hyponatremia 10/02/2017  . Noncompliance 11/05/2017  . Schizophrenia [facility](HCC)    Past Surgical History:  Procedure Laterality Date  . TONSILLECTOMY      Home Medications:  Current Facility-Administered Medications  Medication Dose Route Frequency Provider Last Rate Last Dose  . ceFAZolin (ANCEF) IVPB 2g/100 mL premix  2 g Intravenous 30 min Pre-Op [place] Downs, Mardene CelestePatrick L, MD       Allergies: No Known Allergies  History reviewed. No pertinent family history. Social History:  reports that he has been smoking.  He has been smoking about 0.50 packs per day. He has never used smokeless tobacco. He reports that he does not drink alcohol or use drugs.  Review of Systems  Genitourinary: Positive for frequency and urgency.  All other systems reviewed and are negative.   Physical Exam:  Vital signs in last 24 hours: Temp:  [98.5 F (36.9 C)] 98.5 F (36.9 C) (08/05 0953) Pulse Rate:  [68] 68 (08/05 0953) Resp:  [14] 14 (08/05 0953) BP: (149)/(101) 149/101 (08/05 0953) SpO2:  [87 %-97 %] 97 % (08/05 1000) Weight:  [62.1 kg (137 lb)] 62.1 kg (137 lb) (08/05 0953) Physical Exam  Constitutional: He is oriented to person, place, and time. He appears well-developed and well-nourished.  HENT:  Head: Normocephalic and atraumatic.  Eyes: Pupils are equal, round, and reactive to light. EOM are normal.  Neck: Normal range of motion. No thyromegaly present.  Cardiovascular: Normal rate and regular rhythm.  Respiratory: Effort normal. No respiratory distress.  GI: Soft. He exhibits no  distension.  Musculoskeletal: Normal range of motion. He exhibits no edema.  Neurological: He is alert and oriented to person, place, and time.  Skin: Skin is warm and dry.  Psychiatric: He has a normal mood and affect. His behavior is normal. Judgment and thought content normal.    Laboratory Data:  No results found for this or any previous visit (from the past 24 hour(s)). No results found for this or any previous visit (from the past 240 hour(s)). Creatinine: No results for input(s): CREATININE in the last 168 hours. Baseline Creatinine: unknwon  Impression/Assessment:  62yo with BPH with LUTS, incomplete emptying  Plan:  The risks/benefits/alternatives to Urolift was explained to the patient and he understands and wishes to proceed with surgery  Wilkie AyePatrick Jazlyne Gauger 03/24/2018, 10:24 AM

## 2018-03-24 NOTE — Anesthesia Preprocedure Evaluation (Addendum)
Anesthesia Evaluation  Patient identified by MRN, date of birth, ID band Patient awake    Reviewed: Allergy & Precautions, NPO status , Patient's Chart, lab work & pertinent test results  Airway Mallampati: I       Dental  (+) Edentulous Upper, Edentulous Lower   Pulmonary neg pulmonary ROS, shortness of breath, Current Smoker,  Smokes - "as many as I can have "   Pulmonary exam normal        Cardiovascular hypertension, Pt. on medications negative cardio ROS Normal cardiovascular examI  Poor historian - denies CP/MI/DOE   Neuro/Psych PSYCHIATRIC DISORDERS Schizophrenia negative neurological ROS  negative psych ROS   GI/Hepatic negative GI ROS, Neg liver ROS,   Endo/Other  negative endocrine ROS  Renal/GU Renal InsufficiencyRenal diseasenegative Renal ROS  negative genitourinary   Musculoskeletal negative musculoskeletal ROS (+)   Abdominal   Peds negative pediatric ROS (+)  Hematology negative hematology ROS (+) anemia ,   Anesthesia Other Findings   Reproductive/Obstetrics negative OB ROS                            Anesthesia Physical Anesthesia Plan  ASA: III  Anesthesia Plan: MAC   Post-op Pain Management:    Induction:   PONV Risk Score and Plan:   Airway Management Planned: Simple Face Mask  Additional Equipment:   Intra-op Plan:   Post-operative Plan:   Informed Consent:   Dental advisory given  Plan Discussed with:   Anesthesia Plan Comments: (MAC vs LMA)        Anesthesia Quick Evaluation

## 2018-03-24 NOTE — Op Note (Signed)
   PREOPERATIVE DIAGNOSIS: Benign prostatic hypertrophy with bladder outlet obstruction and urinary retention  POSTOPERATIVE DIAGNOSIS: Benign prostatic hypertrophy with bladder outlet obstruction and urinary retention  PROCEDURE: Cystoscopy with implantation of UroLift devices, 4 implants.  SURGEON: Wilkie AyePatrick McKenzie, M.D.  ANESTHESIA: General  ANTIBIOTICS: ancef  SPECIMEN: None.  DRAINS: A 16-French Foley catheter.  BLOOD LOSS: Minimal.  COMPLICATIONS: None.  INDICATIONS:The Patient is an 62 year old white male with BPH and bladder outlet obstruction with urinary retention. He has failed medical therapy and has elected UroLift for definitive treatment.  FINDINGS OF PROCEDURE: He was taken to the operating room where a genral anesthetic was induced. He was placed in lithotomy position and was fitted with PAS hose. His perineum and genitalia were prepped with chlorhexidine, and he was draped in usual sterile fashion.  Cystoscopy was performed using the UroLift scope and 0 degree lens. Examination revealed a normal urethra. The external sphincter was intact. Prostatic urethra was approximately 4 cm in length with lateral lobe enlargement. There was also little bit of bladder neck elevation. Inspection of bladder revealed mild-to-moderate trabeculation with no tumors, stones, or inflammation. No cellules or diverticula were noted. Ureteral orifices were in their normal anatomic position effluxing clear urine.  After initial cystoscopy, the visual obturator was replaced with the first UroLift device. This was turned to the 9 o'clock position and pulled back to the veru and then slightly advanced. Pressure was then applied to the right lateral lobe and the UroLift device was deployed.  The second UroLift device was then inserted and applied to the left lateral lobe at 3 o'clock and deployed in the mid prostatic urethra. After this, there was still some  apparent obstruction closer to the bladder neck. So a second level of UroLift your left device was applied between the mid urethra and the proximal urethra providing further patency to the prostatic urethra. At this point, there was mild bleeding but the patient did have a spinal anesthetic. So it was thought that a Foley catheter was indicated. The scope was removed and a 16-French Foley catheter was inserted without difficulty. The balloon was filled with 10 mL sterile fluid, and the catheter was placed to straight drainage.  COMPLICATIONS: None   CONDITION: Stable, extubated, transferred to PACU  PLAN: The patient will be discharged home and followup in 2 days for a voiding trial.

## 2018-03-24 NOTE — Anesthesia Postprocedure Evaluation (Signed)
Anesthesia Post Note  Patient: Ray Jackson.  Procedure(s) Performed: CYSTOSCOPY WITH INSERTION OF UROLIFT (N/A )  Patient location during evaluation: PACU Anesthesia Type: MAC Level of consciousness: awake and alert and oriented Pain management: pain level controlled Vital Signs Assessment: post-procedure vital signs reviewed and stable Respiratory status: spontaneous breathing Cardiovascular status: blood pressure returned to baseline and stable Postop Assessment: no apparent nausea or vomiting Anesthetic complications: no     Last Vitals:  Vitals:   03/24/18 1200 03/24/18 1223  BP: (!) 159/91 (!) 153/84  Pulse: 61 61  Resp: 15 16  Temp:  36.7 C  SpO2: 95% 92%    Last Pain:  Vitals:   03/24/18 1223  TempSrc: Oral  PainSc: 0-No pain                 Daisean Brodhead

## 2018-03-25 ENCOUNTER — Encounter (HOSPITAL_COMMUNITY): Payer: Self-pay | Admitting: Urology

## 2018-05-09 ENCOUNTER — Encounter (HOSPITAL_COMMUNITY): Payer: Self-pay | Admitting: *Deleted

## 2018-05-09 ENCOUNTER — Observation Stay (HOSPITAL_COMMUNITY)
Admission: EM | Admit: 2018-05-09 | Discharge: 2018-05-10 | Disposition: A | Discharge status: Home / Self Care | Payer: Medicaid Other | Attending: Family Medicine | Admitting: Family Medicine

## 2018-05-09 ENCOUNTER — Other Ambulatory Visit: Payer: Self-pay

## 2018-05-09 DIAGNOSIS — F17200 Nicotine dependence, unspecified, uncomplicated: Secondary | ICD-10-CM | POA: Diagnosis not present

## 2018-05-09 DIAGNOSIS — I10 Essential (primary) hypertension: Principal | ICD-10-CM | POA: Insufficient documentation

## 2018-05-09 DIAGNOSIS — F209 Schizophrenia, unspecified: Secondary | ICD-10-CM | POA: Diagnosis not present

## 2018-05-09 DIAGNOSIS — R799 Abnormal finding of blood chemistry, unspecified: Secondary | ICD-10-CM | POA: Diagnosis present

## 2018-05-09 DIAGNOSIS — E876 Hypokalemia: Secondary | ICD-10-CM | POA: Diagnosis present

## 2018-05-09 DIAGNOSIS — Z79899 Other long term (current) drug therapy: Secondary | ICD-10-CM | POA: Diagnosis not present

## 2018-05-09 LAB — BASIC METABOLIC PANEL WITH GFR
Anion gap: 8 (ref 5–15)
BUN: 15 mg/dL (ref 8–23)
CO2: 35 mmol/L — ABNORMAL HIGH (ref 22–32)
Calcium: 8.9 mg/dL (ref 8.9–10.3)
Chloride: 101 mmol/L (ref 98–111)
Creatinine, Ser: 1.27 mg/dL — ABNORMAL HIGH (ref 0.61–1.24)
GFR calc Af Amer: >60 mL/min (ref >60)
GFR calc non Af Amer: 59 mL/min — ABNORMAL LOW (ref >60)
Glucose, Bld: 90 mg/dL (ref 70–99)
Potassium: 2.2 mmol/L — CL (ref 3.5–5.1)
Sodium: 144 mmol/L (ref 135–145)

## 2018-05-09 LAB — LITHIUM LEVEL: Lithium Lvl: <0.06 mmol/L — ABNORMAL LOW (ref 0.60–1.20)

## 2018-05-09 LAB — BASIC METABOLIC PANEL
Anion gap: 6 (ref 5–15)
BUN: 14 mg/dL (ref 8–23)
CALCIUM: 8.6 mg/dL — AB (ref 8.9–10.3)
CO2: 34 mmol/L — AB (ref 22–32)
Chloride: 104 mmol/L (ref 98–111)
Creatinine, Ser: 1.2 mg/dL (ref 0.61–1.24)
GFR calc Af Amer: >60 mL/min (ref >60)
Glucose, Bld: 101 mg/dL — ABNORMAL HIGH (ref 70–99)
Potassium: 3.3 mmol/L — ABNORMAL LOW (ref 3.5–5.1)
Sodium: 144 mmol/L (ref 135–145)

## 2018-05-09 LAB — CBC
HCT: 43.4 % (ref 39.0–52.0)
Hemoglobin: 14.2 g/dL (ref 13.0–17.0)
MCH: 30.9 pg (ref 26.0–34.0)
MCHC: 32.7 g/dL (ref 30.0–36.0)
MCV: 94.6 fL (ref 78.0–100.0)
Platelets: 285 K/uL (ref 150–400)
RBC: 4.59 MIL/uL (ref 4.22–5.81)
RDW: 13.5 % (ref 11.5–15.5)
WBC: 8.4 K/uL (ref 4.0–10.5)

## 2018-05-09 LAB — MAGNESIUM: Magnesium: 2 mg/dL (ref 1.7–2.4)

## 2018-05-09 LAB — MRSA PCR SCREENING: MRSA by PCR: NEGATIVE

## 2018-05-09 MED ORDER — SODIUM CHLORIDE 0.9% FLUSH
3.0000 mL | Freq: Two times a day (BID) | INTRAVENOUS | Status: DC
  Administered 2018-05-09 – 2018-05-10 (×3): 3 mL via INTRAVENOUS

## 2018-05-09 MED ORDER — CLONAZEPAM 0.5 MG PO TABS
1.0000 mg | ORAL_TABLET | Freq: Every day | ORAL | Status: DC
  Administered 2018-05-09: 1 mg via ORAL
  Filled 2018-05-09: qty 2

## 2018-05-09 MED ORDER — RISPERIDONE 1 MG PO TBDP
1.0000 mg | ORAL_TABLET | Freq: Every day | ORAL | Status: DC
  Administered 2018-05-09: 1 mg via ORAL
  Filled 2018-05-09 (×3): qty 1

## 2018-05-09 MED ORDER — POTASSIUM CHLORIDE CRYS ER 20 MEQ PO TBCR
40.0000 meq | EXTENDED_RELEASE_TABLET | Freq: Once | ORAL | Status: AC
  Administered 2018-05-09: 40 meq via ORAL
  Filled 2018-05-09: qty 2

## 2018-05-09 MED ORDER — LABETALOL HCL 5 MG/ML IV SOLN: 10.0000 mg | INTRAVENOUS | Status: DC | PRN

## 2018-05-09 MED ORDER — LITHIUM CARBONATE ER 450 MG PO TBCR
450.0000 mg | EXTENDED_RELEASE_TABLET | Freq: Two times a day (BID) | ORAL | Status: DC
  Administered 2018-05-09 – 2018-05-10 (×3): 450 mg via ORAL
  Filled 2018-05-09 (×7): qty 1

## 2018-05-09 MED ORDER — MIRTAZAPINE 15 MG PO TABS
30.0000 mg | ORAL_TABLET | Freq: Every day | ORAL | Status: DC
  Administered 2018-05-09: 30 mg via ORAL
  Filled 2018-05-09: qty 2

## 2018-05-09 MED ORDER — AMLODIPINE BESYLATE 5 MG PO TABS
10.0000 mg | ORAL_TABLET | Freq: Every day | ORAL | Status: DC
  Administered 2018-05-09 – 2018-05-10 (×2): 10 mg via ORAL
  Filled 2018-05-09 (×2): qty 2

## 2018-05-09 MED ORDER — AMLODIPINE BESYLATE 5 MG PO TABS
5.0000 mg | ORAL_TABLET | Freq: Every day | ORAL | Status: DC
  Administered 2018-05-09: 5 mg via ORAL
  Filled 2018-05-09: qty 1

## 2018-05-09 MED ORDER — SODIUM CHLORIDE 0.9 % IV SOLN
Freq: Once | INTRAVENOUS | Status: AC
  Administered 2018-05-09: 07:00:00 via INTRAVENOUS

## 2018-05-09 MED ORDER — SODIUM CHLORIDE 0.9% FLUSH: 3.0000 mL | INTRAVENOUS | Status: DC | PRN

## 2018-05-09 MED ORDER — HYDRALAZINE HCL 25 MG PO TABS
50.0000 mg | ORAL_TABLET | Freq: Three times a day (TID) | ORAL | Status: DC
  Administered 2018-05-09 – 2018-05-10 (×3): 50 mg via ORAL
  Filled 2018-05-09 (×3): qty 2

## 2018-05-09 MED ORDER — ACETAMINOPHEN 650 MG RE SUPP: 650.0000 mg | Freq: Four times a day (QID) | RECTAL | Status: DC | PRN

## 2018-05-09 MED ORDER — SODIUM CHLORIDE 0.9 % IV SOLN: 250.0000 mL | INTRAVENOUS | Status: DC | PRN

## 2018-05-09 MED ORDER — FINASTERIDE 5 MG PO TABS
5.0000 mg | ORAL_TABLET | Freq: Every day | ORAL | Status: DC
  Administered 2018-05-09 – 2018-05-10 (×2): 5 mg via ORAL
  Filled 2018-05-09 (×4): qty 1

## 2018-05-09 MED ORDER — ALBUTEROL SULFATE (2.5 MG/3ML) 0.083% IN NEBU: 2.5000 mg | INHALATION_SOLUTION | RESPIRATORY_TRACT | Status: DC | PRN

## 2018-05-09 MED ORDER — VITAMIN B-12 1000 MCG PO TABS
1000.0000 ug | ORAL_TABLET | Freq: Every day | ORAL | Status: DC
  Administered 2018-05-09 – 2018-05-10 (×2): 1000 ug via ORAL
  Filled 2018-05-09 (×2): qty 1

## 2018-05-09 MED ORDER — HYDRALAZINE HCL 20 MG/ML IJ SOLN: 10.0000 mg | Freq: Four times a day (QID) | INTRAMUSCULAR | Status: DC | PRN

## 2018-05-09 MED ORDER — TRAZODONE HCL 50 MG PO TABS
200.0000 mg | ORAL_TABLET | Freq: Every day | ORAL | Status: DC
  Administered 2018-05-09: 200 mg via ORAL
  Filled 2018-05-09: qty 4

## 2018-05-09 MED ORDER — ACETAMINOPHEN 325 MG PO TABS: 650.0000 mg | ORAL_TABLET | Freq: Four times a day (QID) | ORAL | Status: DC | PRN

## 2018-05-09 MED ORDER — ONDANSETRON HCL 4 MG/2ML IJ SOLN: 4.0000 mg | Freq: Four times a day (QID) | INTRAMUSCULAR | Status: DC | PRN

## 2018-05-09 MED ORDER — POLYETHYLENE GLYCOL 3350 17 G PO PACK: 17.0000 g | PACK | Freq: Every day | ORAL | Status: DC | PRN

## 2018-05-09 MED ORDER — VITAMIN D 1000 UNITS PO TABS
2000.0000 [IU] | ORAL_TABLET | Freq: Every day | ORAL | Status: DC
  Administered 2018-05-09 – 2018-05-10 (×2): 2000 [IU] via ORAL
  Filled 2018-05-09 (×2): qty 2

## 2018-05-09 MED ORDER — FLUTICASONE PROPIONATE 50 MCG/ACT NA SUSP
2.0000 | Freq: Every day | NASAL | Status: DC
  Administered 2018-05-09 – 2018-05-10 (×2): 2 via NASAL
  Filled 2018-05-09: qty 16

## 2018-05-09 MED ORDER — LACTULOSE 10 GM/15ML PO SOLN
20.0000 g | Freq: Two times a day (BID) | ORAL | Status: DC
  Administered 2018-05-09 – 2018-05-10 (×3): 20 g via ORAL
  Filled 2018-05-09 (×3): qty 30

## 2018-05-09 MED ORDER — ONDANSETRON HCL 4 MG PO TABS: 4.0000 mg | ORAL_TABLET | Freq: Four times a day (QID) | ORAL | Status: DC | PRN

## 2018-05-09 MED ORDER — INVEGA SUSTENNA 156 MG/ML IM SUSP: 156.0000 mg | INTRAMUSCULAR | Status: DC

## 2018-05-09 MED ORDER — POTASSIUM CHLORIDE 10 MEQ/100ML IV SOLN
10.0000 meq | INTRAVENOUS | Status: AC
  Administered 2018-05-09 (×4): 10 meq via INTRAVENOUS
  Filled 2018-05-09 (×4): qty 100

## 2018-05-09 MED ORDER — SODIUM CHLORIDE 0.9 % IV SOLN
INTRAVENOUS | Status: DC | PRN
  Administered 2018-05-09: 1000 mL via INTRAVENOUS

## 2018-05-09 MED ORDER — TRAZODONE HCL 50 MG PO TABS: 50.0000 mg | ORAL_TABLET | Freq: Every evening | ORAL | Status: DC | PRN

## 2018-05-09 MED ORDER — HEPARIN SODIUM (PORCINE) 5000 UNIT/ML IJ SOLN
5000.0000 [IU] | Freq: Three times a day (TID) | INTRAMUSCULAR | Status: DC
  Administered 2018-05-09 – 2018-05-10 (×2): 5000 [IU] via SUBCUTANEOUS
  Filled 2018-05-09 (×3): qty 1

## 2018-05-09 MED ORDER — LORATADINE 10 MG PO TABS
10.0000 mg | ORAL_TABLET | Freq: Every day | ORAL | Status: DC
  Administered 2018-05-09 – 2018-05-10 (×2): 10 mg via ORAL
  Filled 2018-05-09 (×2): qty 1

## 2018-05-09 MED ORDER — ISOSORBIDE MONONITRATE ER 60 MG PO TB24
30.0000 mg | ORAL_TABLET | Freq: Every day | ORAL | Status: DC
  Administered 2018-05-09 – 2018-05-10 (×2): 30 mg via ORAL
  Filled 2018-05-09 (×2): qty 1

## 2018-05-09 MED ORDER — FLUOXETINE HCL 20 MG PO CAPS
20.0000 mg | ORAL_CAPSULE | Freq: Every day | ORAL | Status: DC
  Administered 2018-05-09 – 2018-05-10 (×2): 20 mg via ORAL
  Filled 2018-05-09 (×2): qty 1

## 2018-05-09 NOTE — ED Triage Notes (Signed)
Pt brought in by [facility] from [facility] for abnormal labs; facility told ems his potassium is low; pt states he was sleeping when he was woke up and told he had to come to the hospital; pt denies any pain

## 2018-05-09 NOTE — ED Notes (Signed)
Pt's O2 84%, pt placed on 2L via [place]

## 2018-05-09 NOTE — ED Notes (Signed)
CRITICAL VALUE ALERT  Critical Value:  Potassium 2.2  Date & Time Notied:  0610 05/09/18  Provider Notified: [email or web address]  Orders Received/Actions taken: md notified

## 2018-05-09 NOTE — H&P (Signed)
Patient Demographics:    Ray Jackson, is a 62 y.o. male  MRN: [id number]   DOB - 1956-04-02  Admit Date - 05/09/2018  Outpatient Primary MD for the patient is Anselm JunglingHatchett, Mary, NP   Assessment & Plan:    Principal Problem:   Hypokalemia Active Problems:   Schizophrenia (HCC)   HTN (hypertension)    Plan:- 1)Hypokalemia--- patient complains of fatigue, no palpitations no dizziness no chest pains potassium is 2.2 suspect secondary to HCTZ use, patient denies vomiting or diarrhea, magnesium is normal at 2.0, replace potassium, stop HCTZ, EKG with prolonged QTc of 515  2)HTN--- stage II, blood pressure was up to 210/112, patient admits to noncompliance, start amlodipine up to 10 mg daily, hydralazine 50 mg 3 times daily and isosorbide 30 mg daily,   may use IV labetalol when necessary  Every 4 hours for systolic blood pressure over 160 mmhg  3)Schizophrenia--- lithium level is not therapeutic, patient admits to flushing his lithium pills down the toilet, overall appears stable, resume home regimen   With History of - Reviewed by me  Past Medical History:  Diagnosis Date  . ARF (acute renal failure) (HCC) 10/02/2017  . Constipation   . Dyspnea   . Hypertension   . Hypokalemia   . Hyponatremia 10/02/2017  . Noncompliance 11/05/2017  . Schizophrenia [facility](HCC)       Past Surgical History:  Procedure Laterality Date  . CYSTOSCOPY WITH INSERTION OF UROLIFT N/A 03/24/2018   Procedure: CYSTOSCOPY WITH INSERTION OF UROLIFT;  Surgeon: Malen GauzeMcKenzie, Patrick L, MD;  Location: [facility];  Service: Urology;  Laterality: N/A;  . TONSILLECTOMY       Chief Complaint  Patient presents with  . abnormal labs      HPI:    Ray Jackson  is a 62 y.o. male with past medical history relevant for hypertension on HCTZ, schizophrenia on  antipsychotic medications who presents to the ED from assisted living facility after being found to have potassium of 2.2 on his labs   patient complains of fatigue, no palpitations no dizziness no chest pains,  potassium is 2.2 suspect secondary to HCTZ use, patient denies vomiting or diarrhea, magnesium is normal at 2.0,   EKG with prolonged QTc of 515   In ED blood pressure was up to 210/112, patient admits to noncompliance with dietary restriction  No leg swelling, no shortness of breath or dyspnea on exertion     Review of systems:    In addition to the HPI above,   A full Review of  Systems was done, all other systems reviewed are negative except as noted above in HPI , .    Social History:  Reviewed by me    Social History   Tobacco Use  . Smoking status: Current Every Day Smoker    Packs/day: 0.50  . Smokeless tobacco: Never Used  Substance Use Topics  . Alcohol use: No    Frequency: Never  Family History :  Reviewed by me   HTN  Home Medications:   Prior to Admission medications   Medication Sig Start Date End Date Taking? Authorizing Provider  cetirizine (ZYRTEC) 10 MG tablet Take 10 mg by mouth at bedtime.    Yes [provider]  Cholecalciferol (VITAMIN D3) 2000 units capsule Take 2,000 Units by mouth daily.   Yes [provider]  clonazePAM (KLONOPIN) 1 MG tablet Take 1 mg by mouth at bedtime.    Yes [provider]  docusate sodium (COLACE) 100 MG capsule Take 100 mg by mouth 2 (two) times daily.   Yes [provider]  finasteride (PROSCAR) 5 MG tablet Take 5 mg by mouth daily.   Yes [provider]  FLUoxetine (PROZAC) 20 MG tablet Take 20 mg by mouth daily.   Yes [provider]  fluticasone (FLONASE) 50 MCG/ACT nasal spray Place 2 sprays into both nostrils daily.   Yes [provider]  hydrochlorothiazide (HYDRODIURIL) 25 MG tablet Take 25 mg by mouth daily.   Yes [provider]  lactulose (CHRONULAC) 10 GM/15ML solution Take 20 g by mouth 2 (two) times daily.    Yes [provider]  lithium carbonate (ESKALITH) 450 MG CR tablet Take 450 mg by mouth 2 (two) times daily.   Yes [provider]  mirtazapine (REMERON) 30 MG tablet Take 30 mg by mouth at bedtime.   Yes [provider]  paliperidone (INVEGA SUSTENNA) 156 MG/ML SUSP injection Inject 1 mL (156 mg total) into the muscle every 30 (thirty) days. Next dose due on 4/16 11/14/17  Yes McNew, Ileene Hutchinson, MD  polyethylene glycol (MIRALAX / GLYCOLAX) packet Take 17 g by mouth daily.   Yes [provider]  risperiDONE (RISPERDAL M-TABS) 1 MG disintegrating tablet Take 1 tablet by mouth at bedtime. 09/12/17  Yes [provider]  traMADol (ULTRAM) 50 MG tablet Take 1 tablet (50 mg total) by mouth every 6 (six) hours as needed. 03/24/18 03/24/19 Yes McKenzie, Mardene Celeste, MD  traZODone (DESYREL) 100 MG tablet Take 200 mg by mouth at bedtime.   Yes [provider]  vitamin B-12 (CYANOCOBALAMIN) 1000 MCG tablet Take 1,000 mcg by mouth daily.   Yes [provider]     Allergies:    No Known Allergies   Physical Exam:   Vitals  Blood pressure (!) 175/114, pulse 62, temperature 98 F (36.7 C), temperature source Oral, resp. rate 16, height 6\' 2"  (1.88 m), weight 58.1 kg, SpO2 96 %.  Physical Examination: General appearance - alert, looks older than stated age, and in no distress Mental status - alert, oriented to person, place, and time,  Eyes - sclera anicteric Neck - supple, no JVD elevation , Chest - clear  to auscultation bilaterally, symmetrical air movement, Heart - S1 and S2 normal, regular Abdomen - soft, nontender, nondistended, no masses or organomegaly Neurological - screening mental status exam normal, neck supple without rigidity, cranial nerves II through XII intact, DTR's normal and symmetric Extremities - no pedal edema noted, intact peripheral  pulses  Skin - warm, dry     Data Review:    CBC Recent Labs  Lab 05/09/18 0542  WBC 8.4  HGB 14.2  HCT 43.4  PLT 285  MCV 94.6  MCH 30.9  MCHC 32.7  RDW 13.5   ------------------------------------------------------------------------------------------------------------------  Chemistries  Recent Labs  Lab 05/09/18 0542 05/09/18 1348  NA 144 144  K 2.2* 3.3*  CL 101  104  CO2 35* 34*  GLUCOSE 90 101*  BUN 15 14  CREATININE 1.27* 1.20  CALCIUM 8.9 8.6*  MG 2.0  --    ------------------------------------------------------------------------------------------------------------------ estimated creatinine clearance is 52.5 mL/min (by C-G formula based on SCr of 1.2 mg/dL). ------------------------------------------------------------------------------------------------------------------ No results for input(s): TSH, T4TOTAL, T3FREE, THYROIDAB in the last 72 hours.  Invalid input(s): FREET3   Coagulation profile No results for input(s): INR, PROTIME in the last 168 hours. ------------------------------------------------------------------------------------------------------------------- No results for input(s): DDIMER in the last 72 hours. -------------------------------------------------------------------------------------------------------------------  Cardiac Enzymes No results for input(s): CKMB, TROPONINI, MYOGLOBIN in the last 168 hours.  Invalid input(s): CK ------------------------------------------------------------------------------------------------------------------ No results found for: BNP   ---------------------------------------------------------------------------------------------------------------  Urinalysis    Component Value Date/Time   COLORURINE YELLOW 11/30/2017 0815   APPEARANCEUR HAZY (A) 11/30/2017 0815   APPEARANCEUR Clear 04/01/2014 0703   LABSPEC 1.009 11/30/2017 0815   LABSPEC 1.006 04/01/2014 0703   PHURINE 7.0 11/30/2017 0815     GLUCOSEU NEGATIVE 11/30/2017 0815   GLUCOSEU Negative 04/01/2014 0703   HGBUR NEGATIVE 11/30/2017 0815   BILIRUBINUR NEGATIVE 11/30/2017 0815   BILIRUBINUR Negative 04/01/2014 0703   KETONESUR NEGATIVE 11/30/2017 0815   PROTEINUR NEGATIVE 11/30/2017 0815   NITRITE NEGATIVE 11/30/2017 0815   LEUKOCYTESUR SMALL (A) 11/30/2017 0815   LEUKOCYTESUR Negative 04/01/2014 0703    ----------------------------------------------------------------------------------------------------------------   Imaging Results:    No results found.  Radiological Exams on Admission: No results found.  DVT Prophylaxis -SCD  /heparin AM Labs Ordered, also please review Full Orders  Family Communication: Admission, patients condition and plan of care including tests being ordered have been discussed with the patient who indicate understanding and agree with the plan   Code Status - Full Code  Likely DC to ALF  Condition   -stable  Shon Hale M.D on 05/09/2018 at 4:15 PM Pager---[phone] Go to [email or web address] - [email or web address] for contact info  [facility] - Office  [phone]

## 2018-05-09 NOTE — ED Provider Notes (Signed)
[facility] EMERGENCY DEPARTMENT Provider Note   CSN: [id number] Arrival date & time: 05/09/18  0522     History   Chief Complaint Chief Complaint  Patient presents with  . abnormal labs    HPI Ray Jackson. is a 62 y.o. male.  Patient sent to the emergency department from assisted living because of abnormal labs.  Patient reportedly had labs returned sometime tonight that showed that he had low potassium.  Lab work was not sent with the patient from assisted living.  Patient reports that he was sleeping when EMS arrived.  They woke him up and brought him to the ER, he is angry about this.     Past Medical History:  Diagnosis Date  . ARF (acute renal failure) (HCC) 10/02/2017  . Constipation   . Dyspnea   . Hypertension   . Hypokalemia   . Hyponatremia 10/02/2017  . Noncompliance 11/05/2017  . Schizophrenia [facility])     Patient Active Problem List   Diagnosis Date Noted  . Schizophrenia (HCC) 11/05/2017  . UTI (urinary tract infection) 10/02/2017  . Anemia 10/02/2017    Past Surgical History:  Procedure Laterality Date  . CYSTOSCOPY WITH INSERTION OF UROLIFT N/A 03/24/2018   Procedure: CYSTOSCOPY WITH INSERTION OF UROLIFT;  Surgeon: Malen Gauze, MD;  Location: [facility] OR[facility];  Service: Urology;  Laterality: N/A;  . TONSILLECTOMY          Home Medications    Prior to Admission medications   Medication Sig Start Date End Date Taking? Authorizing Provider  cetirizine (ZYRTEC) 10 MG tablet Take 10 mg by mouth at bedtime.     [provider]  Cholecalciferol (VITAMIN D3) 2000 units capsule Take 2,000 Units by mouth daily.    [provider]  clonazePAM (KLONOPIN) 1 MG tablet Take 1 mg by mouth at bedtime.     [provider]  docusate sodium (COLACE) 100 MG capsule Take 100 mg by mouth 2 (two) times daily.    [provider]  finasteride (PROSCAR) 5 MG tablet Take 5 mg by mouth daily.    [provider]  FLUoxetine  (PROZAC) 20 MG tablet Take 20 mg by mouth daily.    [provider]  fluticasone (FLONASE) 50 MCG/ACT nasal spray Place 2 sprays into both nostrils daily.    [provider]  hydrochlorothiazide (HYDRODIURIL) 25 MG tablet Take 25 mg by mouth daily.    [provider]  lactulose (CHRONULAC) 10 GM/15ML solution Take 20 g by mouth 2 (two) times daily.     [provider]  lithium carbonate (ESKALITH) 450 MG CR tablet Take 450 mg by mouth 2 (two) times daily.    [provider]  mirtazapine (REMERON) 30 MG tablet Take 30 mg by mouth at bedtime.    [provider]  paliperidone (INVEGA SUSTENNA) 156 MG/ML SUSP injection Inject 1 mL (156 mg total) into the muscle every 30 (thirty) days. Next dose due on 4/16 11/14/17   McNew, Ileene Hutchinson, MD  polyethylene glycol (MIRALAX / GLYCOLAX) packet Take 17 g by mouth daily.    [provider]  risperiDONE (RISPERDAL M-TABS) 1 MG disintegrating tablet Take 1 tablet by mouth at bedtime. 09/12/17   [provider]  traMADol (ULTRAM) 50 MG tablet Take 1 tablet (50 mg total) by mouth every 6 (six) hours as needed. 03/24/18 03/24/19  Malen Gauze, MD  traZODone (DESYREL) 100 MG tablet Take 200 mg by mouth at bedtime.  [provider]  vitamin B-12 (CYANOCOBALAMIN) 1000 MCG tablet Take 1,000 mcg by mouth daily.    [provider]    Family History History reviewed. No pertinent family history.  Social History Social History   Tobacco Use  . Smoking status: Current Every Day Smoker    Packs/day: 0.50  . Smokeless tobacco: Never Used  Substance Use Topics  . Alcohol use: No    Frequency: Never  . Drug use: No     Allergies   Patient has no known allergies.   Review of Systems Review of Systems  Respiratory: Negative for shortness of breath.   Cardiovascular: Negative for chest pain.  Gastrointestinal: Negative for diarrhea, nausea and vomiting.  All other systems  reviewed and are negative.    Physical Exam Updated Vital Signs BP (!) 192/102 (BP Location: Left Arm)   Pulse (!) 56   Temp 97.9 F (36.6 C) (Oral)   Resp 14   Ht 6\' 2"  (1.88 m)   Wt 62.1 kg   SpO2 90%   BMI 17.58 kg/m   Physical Exam  Constitutional: He is oriented to person, place, and time. He appears well-developed and well-nourished. No distress.  HENT:  Head: Normocephalic and atraumatic.  Right Ear: Hearing normal.  Left Ear: Hearing normal.  Nose: Nose normal.  Mouth/Throat: Oropharynx is clear and moist and mucous membranes are normal.  Eyes: Pupils are equal, round, and reactive to light. Conjunctivae and EOM are normal.  Neck: Normal range of motion. Neck supple.  Cardiovascular: Regular rhythm, S1 normal and S2 normal. Exam reveals no gallop and no friction rub.  No murmur heard. Pulmonary/Chest: Effort normal and breath sounds normal. No respiratory distress. He exhibits no tenderness.  Abdominal: Soft. Normal appearance and bowel sounds are normal. There is no hepatosplenomegaly. There is no tenderness. There is no rebound, no guarding, no tenderness at McBurney's point and negative Murphy's sign. No hernia.  Musculoskeletal: Normal range of motion.  Neurological: He is alert and oriented to person, place, and time. He has normal strength. No cranial nerve deficit or sensory deficit. Coordination normal. GCS eye subscore is 4. GCS verbal subscore is 5. GCS motor subscore is 6.  Skin: Skin is warm, dry and intact. No rash noted. No cyanosis.  Psychiatric: He has a normal mood and affect. His speech is normal and behavior is normal. Thought content normal.  Nursing note and vitals reviewed.    ED Treatments / Results  Labs (all labs ordered are listed, but only abnormal results are displayed) Labs Reviewed  BASIC METABOLIC PANEL - Abnormal; Notable for the following components:      Result Value   Potassium 2.2 (*)    CO2 35 (*)    Creatinine, Ser 1.27 (*)     GFR calc non Af Amer 59 (*)    All other components within normal limits  MAGNESIUM  CBC  LITHIUM LEVEL    EKG EKG Interpretation  Date/Time:  Friday May 09 2018 06:22:55 EDT Ventricular Rate:  52 PR Interval:    QRS Duration: 101 QT Interval:  553 QTC Calculation: 515 R Axis:     Text Interpretation:  Sinus rhythm Prolonged QT interval Confirmed by Gilda CreasePollina, Christopher J [phone]([id number]) on 05/09/2018 6:28:27 AM   Radiology No results found.  Procedures Procedures (including critical care time)  Medications Ordered in ED Medications  potassium chloride 10 mEq in 100 mL IVPB (has no administration in time range)  potassium chloride SA (K-DUR,KLOR-CON) CR tablet  40 mEq (40 mEq Oral Given 05/09/18 0630)     Initial Impression / Assessment and Plan / ED Course  I have reviewed the triage vital signs and the nursing notes.  Pertinent labs & imaging results that were available during my care of the patient were reviewed by me and considered in my medical decision making (see chart for details).     Patient sent to the ER from assisted living because of abnormal outpatient labs.  Patient's labs reportedly resulted sometime overnight and he was sent to the ER because of low potassium.  Repeat here shows a potassium of 2.2.  He does have a prolonged QT on EKG.  Will require hospitalization for potassium repletion.  His magnesium is normal.  Final Clinical Impressions(s) / ED Diagnoses   Final diagnoses:  Hypokalemia  Essential hypertension    ED Discharge Orders    None       Pollina, Canary Brim, MD 05/09/18 [id number]

## 2018-05-10 DIAGNOSIS — E876 Hypokalemia: Secondary | ICD-10-CM | POA: Diagnosis not present

## 2018-05-10 LAB — BASIC METABOLIC PANEL
Anion gap: 7 (ref 5–15)
BUN: 12 mg/dL (ref 8–23)
CALCIUM: 8.9 mg/dL (ref 8.9–10.3)
CO2: 30 mmol/L (ref 22–32)
Chloride: 106 mmol/L (ref 98–111)
Creatinine, Ser: 1.03 mg/dL (ref 0.61–1.24)
GFR calc Af Amer: >60 mL/min (ref >60)
Glucose, Bld: 149 mg/dL — ABNORMAL HIGH (ref 70–99)
Potassium: 3.5 mmol/L (ref 3.5–5.1)
Sodium: 143 mmol/L (ref 135–145)

## 2018-05-10 LAB — CBC
HCT: 40.8 % (ref 39.0–52.0)
HEMOGLOBIN: 13.1 g/dL (ref 13.0–17.0)
MCH: 30.6 pg (ref 26.0–34.0)
MCHC: 32.1 g/dL (ref 30.0–36.0)
MCV: 95.3 fL (ref 78.0–100.0)
PLATELETS: 256 10*3/uL (ref 150–400)
RBC: 4.28 MIL/uL (ref 4.22–5.81)
RDW: 13.7 % (ref 11.5–15.5)
WBC: 10.7 10*3/uL — ABNORMAL HIGH (ref 4.0–10.5)

## 2018-05-10 MED ORDER — TRAZODONE HCL 50 MG PO TABS: 50.0000 mg | ORAL_TABLET | Freq: Every day | ORAL | 3 refills | Status: DC

## 2018-05-10 MED ORDER — LACTULOSE 10 GM/15ML PO SOLN: 20.0000 g | Freq: Every day | ORAL | 2 refills | Status: DC

## 2018-05-10 MED ORDER — AMLODIPINE BESYLATE 5 MG PO TABS: 5.0000 mg | ORAL_TABLET | Freq: Every day | ORAL | 2 refills | Status: DC

## 2018-05-10 MED ORDER — HYDRALAZINE HCL 50 MG PO TABS: 50.0000 mg | ORAL_TABLET | Freq: Three times a day (TID) | ORAL | 2 refills | Status: DC

## 2018-05-10 MED ORDER — ISOSORBIDE MONONITRATE ER 30 MG PO TB24: 30.0000 mg | ORAL_TABLET | Freq: Every day | ORAL | 2 refills | Status: DC

## 2018-05-10 MED ORDER — ACETAMINOPHEN 325 MG PO TABS: 650.0000 mg | ORAL_TABLET | Freq: Four times a day (QID) | ORAL | 0 refills | Status: DC | PRN

## 2018-05-10 MED ORDER — POTASSIUM CHLORIDE ER 20 MEQ PO TBCR: 20.0000 meq | EXTENDED_RELEASE_TABLET | Freq: Every day | ORAL | 0 refills | Status: DC

## 2018-05-10 MED ORDER — POTASSIUM CHLORIDE CRYS ER 20 MEQ PO TBCR
40.0000 meq | EXTENDED_RELEASE_TABLET | Freq: Once | ORAL | Status: AC
  Administered 2018-05-10: 40 meq via ORAL
  Filled 2018-05-10: qty 2

## 2018-05-10 NOTE — Progress Notes (Signed)
Spoke with sister and legal guardian, [name], and informed her that patient would be discharged today back to [facility].

## 2018-05-10 NOTE — Discharge Summary (Signed)
Ray Hamme Maximo Spratling., is a 62 y.o. male  DOB 03/03/1956  MRN [id number].  Admission date:  05/09/2018  Admitting Physician  Meredeth Ide, MD  Discharge Date:  05/10/2018   Primary MD  Anselm Jungling, NP  Recommendations for primary care physician for things to follow:  1) stop HCTZ/Hydrocorthiazide due to persistent hypokalemia (low potassium) 2) take blood pressure medications including isosorbide/hydralazine and amlodipine as prescribed for high blood pressure 3) low-salt diet advised 4) smoking cessation strongly advised 5) repeat BMP/kidney blood test with PCP on Monday, 05/12/2018   Admission Diagnosis  Hypokalemia [E87.6] Essential hypertension [I10]   Discharge Diagnosis  Hypokalemia [E87.6] Essential hypertension [I10]    Principal Problem:   Hypokalemia Active Problems:   Schizophrenia (HCC)   HTN (hypertension)      Past Medical History:  Diagnosis Date  . ARF (acute renal failure) (HCC) 10/02/2017  . Constipation   . Dyspnea   . Hypertension   . Hypokalemia   . Hyponatremia 10/02/2017  . Noncompliance 11/05/2017  . Schizophrenia [facility])     Past Surgical History:  Procedure Laterality Date  . CYSTOSCOPY WITH INSERTION OF UROLIFT N/A 03/24/2018   Procedure: CYSTOSCOPY WITH INSERTION OF UROLIFT;  Surgeon: Malen Gauze, MD;  Location: [facility];  Service: Urology;  Laterality: N/A;  . TONSILLECTOMY       HPI  from the history and physical done on the day of admission:      Ray Jackson  is a 62 y.o. male with past medical history relevant for hypertension on HCTZ, schizophrenia on antipsychotic medications who presents to the ED from assisted living facility after being found to have potassium of 2.2 on his labs   patient complains of fatigue, no palpitations no dizziness no chest pains,  potassium is 2.2 suspect secondary to HCTZ use, patient denies vomiting or diarrhea,  magnesium is normal at 2.0,   EKG with prolonged QTc of 515   In ED blood pressure was up to 210/112, patient admits to noncompliance with dietary restriction  No leg swelling, no shortness of breath or dyspnea on exertion   Hospital Course:   Plan:- 1)Hypokalemia---  admitted with potassium of 2.2 suspect secondary to HCTZ use, patient denies vomiting or diarrhea, magnesium is normal at 2.0, replaced potassium, stopped HCTZ,   2)HTN--- stage II, blood pressure was up to 210/112, patient admits to noncompliance, BP much improved on amlodipine, hydralazine 50 mg TID and isosorbide 30 mg daily,   3)Schizophrenia--- lithium level is not therapeutic, patient admits to flushing his lithium pills down the toilet, overall appears stable, resume home antipsychotic medication regimen, compliance advised  4)Tobacco Abuse-smoking cessation strongly advised  Discharge Condition: stable  Follow UP- BP for recheck within 1 week  Diet and Activity recommendation:  As advised  Discharge Instructions    Discharge Instructions    Diet - low sodium heart healthy   Complete by:  As directed    Discharge instructions   Complete by:  As directed  1) stop HCTZ/Hydrocorthiazide due to persistent hypokalemia (low potassium) 2) take blood pressure medications including isosorbide/hydralazine and amlodipine as prescribed for high blood pressure 3) low-salt diet advised 4) smoking cessation strongly advised 5) repeat BMP/kidney blood test with PCP on Monday, 05/12/2018   Increase activity slowly   Complete by:  As directed       Discharge Medications     Allergies as of 05/10/2018   No Known Allergies     Medication List    STOP taking these medications   docusate sodium 100 MG capsule Commonly known as:  COLACE   hydrochlorothiazide 25 MG tablet Commonly known as:  HYDRODIURIL     TAKE these medications   acetaminophen 325 MG tablet Commonly known as:  TYLENOL Take 2 tablets  (650 mg total) by mouth every 6 (six) hours as needed for mild pain (or Fever >/= 101).   amLODipine 5 MG tablet Commonly known as:  NORVASC Take 1 tablet (5 mg total) by mouth daily. Start taking on:  05/11/2018   cetirizine 10 MG tablet Commonly known as:  ZYRTEC Take 10 mg by mouth at bedtime.   clonazePAM 1 MG tablet Commonly known as:  KLONOPIN Take 1 mg by mouth at bedtime.   finasteride 5 MG tablet Commonly known as:  PROSCAR Take 5 mg by mouth daily.   FLUoxetine 20 MG tablet Commonly known as:  PROZAC Take 20 mg by mouth daily.   fluticasone 50 MCG/ACT nasal spray Commonly known as:  FLONASE Place 2 sprays into both nostrils daily.   hydrALAZINE 50 MG tablet Commonly known as:  APRESOLINE Take 1 tablet (50 mg total) by mouth 3 (three) times daily.   isosorbide mononitrate 30 MG 24 hr tablet Commonly known as:  IMDUR Take 1 tablet (30 mg total) by mouth daily. Start taking on:  05/11/2018   lactulose 10 GM/15ML solution Commonly known as:  CHRONULAC Take 30 mLs (20 g total) by mouth daily. What changed:  when to take this   lithium carbonate 450 MG CR tablet Commonly known as:  ESKALITH Take 450 mg by mouth 2 (two) times daily.   mirtazapine 30 MG tablet Commonly known as:  REMERON Take 30 mg by mouth at bedtime.   paliperidone 156 MG/ML Susp injection Commonly known as:  INVEGA SUSTENNA Inject 1 mL (156 mg total) into the muscle every 30 (thirty) days. Next dose due on 4/16   polyethylene glycol packet Commonly known as:  MIRALAX / GLYCOLAX Take 17 g by mouth daily.   Potassium Chloride ER 20 MEQ Tbcr Take 20 mEq by mouth daily for 3 days. 1 tab daily by mouth   risperiDONE 1 MG disintegrating tablet Commonly known as:  RISPERDAL M-TABS Take 1 tablet by mouth at bedtime.   traMADol 50 MG tablet Commonly known as:  ULTRAM Take 1 tablet (50 mg total) by mouth every 6 (six) hours as needed.   traZODone 50 MG tablet Commonly known as:   DESYREL Take 1 tablet (50 mg total) by mouth at bedtime. What changed:    medication strength  how much to take   vitamin B-12 1000 MCG tablet Commonly known as:  CYANOCOBALAMIN Take 1,000 mcg by mouth daily.   Vitamin D3 2000 units capsule Take 2,000 Units by mouth daily.       Major procedures and Radiology Reports - PLEASE review detailed and final reports for all details, in brief -   No results found.  Micro Results   Recent  Results (from the past 240 hour(s))  MRSA PCR Screening     Status: None   Collection Time: 05/09/18  8:08 AM  Result Value Ref Range Status   MRSA by PCR NEGATIVE NEGATIVE Final    Comment:        The GeneXpert MRSA Assay (FDA approved for NASAL specimens only), is one component of a comprehensive MRSA colonization surveillance program. It is not intended to diagnose MRSA infection nor to guide or monitor treatment for MRSA infections. Performed at [facility], [address] [place], [place] [address]        Today   Subjective    Ray Jackson today has no new complaints, eating and drinking well, no chest pains and palpitations no dizziness no shortness of breath, patient is eager to go home       Patient has been seen and examined prior to discharge   Objective   Blood pressure 127/73, pulse 63, temperature 98 F (36.7 C), temperature source Oral, resp. rate 18, height 6\' 2"  (1.88 m), weight 58.1 kg, SpO2 95 %.   Intake/Output Summary (Last 24 hours) at 05/10/2018 1333 Last data filed at 05/09/2018 1800 Gross per 24 hour  Intake 240 ml  Output 800 ml  Net -560 ml    Exam Physical Examination: General appearance - alert, looks older than stated age, and in no distress Mental status - alert, oriented to person, place, and time,  Eyes - sclera anicteric Neck - supple, no JVD elevation , Chest - clear  to auscultation bilaterally, symmetrical air movement, Heart - S1 and S2 normal, regular Abdomen - soft, nontender,  nondistended, no masses or organomegaly Neurological - screening mental status exam normal, neck supple without rigidity, cranial nerves II through XII intact, DTR's normal and symmetric Extremities - no pedal edema noted, intact peripheral pulses  Skin - warm, dry   Data Review   CBC w Diff:  Lab Results  Component Value Date   WBC 10.7 (H) 05/10/2018   HGB 13.1 05/10/2018   HGB 15.1 03/31/2014   HCT 40.8 05/10/2018   HCT 46.0 03/31/2014   PLT 256 05/10/2018   PLT 295 03/31/2014   LYMPHOPCT 15 11/30/2017   MONOPCT 9 11/30/2017   EOSPCT 4 11/30/2017   BASOPCT 0 11/30/2017    CMP:  Lab Results  Component Value Date   NA 143 05/10/2018   NA 145 03/31/2014   K 3.5 05/10/2018   K 3.6 03/31/2014   CL 106 05/10/2018   CL 109 (H) 03/31/2014   CO2 30 05/10/2018   CO2 29 03/31/2014   BUN 12 05/10/2018   BUN 14 03/31/2014   CREATININE 1.03 05/10/2018   CREATININE 1.11 03/31/2014   PROT 6.1 (L) 11/30/2017   PROT 7.0 03/31/2014   ALBUMIN 3.0 (L) 11/30/2017   ALBUMIN 3.3 (L) 03/31/2014   BILITOT 0.3 11/30/2017   BILITOT 0.4 03/31/2014   ALKPHOS 50 11/30/2017   ALKPHOS 112 03/31/2014   AST 13 (L) 11/30/2017   AST 25 03/31/2014   ALT 13 (L) 11/30/2017   ALT 25 03/31/2014    Total Discharge time is about 33 minutes  Shon Haleourage Azariel Banik M.D on 05/10/2018 at 1:34 PM  Pager---[phone]  Go to [email or web address] - password [email or web address] for contact info  [facility] - Office  [phone]

## 2018-05-10 NOTE — Progress Notes (Signed)
Pt refused labwork this morning.

## 2018-05-10 NOTE — Discharge Instructions (Signed)
1) stop HCTZ/Hydrocorthiazide due to persistent hypokalemia (low potassium) 2) take blood pressure medications including isosorbide/hydralazine and amlodipine as prescribed for high blood pressure 3) low-salt diet advised 4) smoking cessation strongly advised 5) repeat BMP/kidney blood test with PCP on Monday, 05/12/2018

## 2018-05-10 NOTE — Progress Notes (Signed)
Patient discharged home today per MD orders. Patient vital signs WDL. IV removed and site WDL. Discharge Instructions including follow up appointments, medications, and education reviewed with patient. Patient verbalizes understanding. Patient is transported by EMS to [facility]

## 2019-09-25 IMAGING — CR DG CHEST 2V
2 series · 2 of 2 positions shown · non-contrast
Comparison: 08/10/2017

CLINICAL DATA: Bladder pain, shortness of breath, hypertension,
smoker, schizophrenia

EXAM:
CHEST - 2 VIEW

[chest pa]
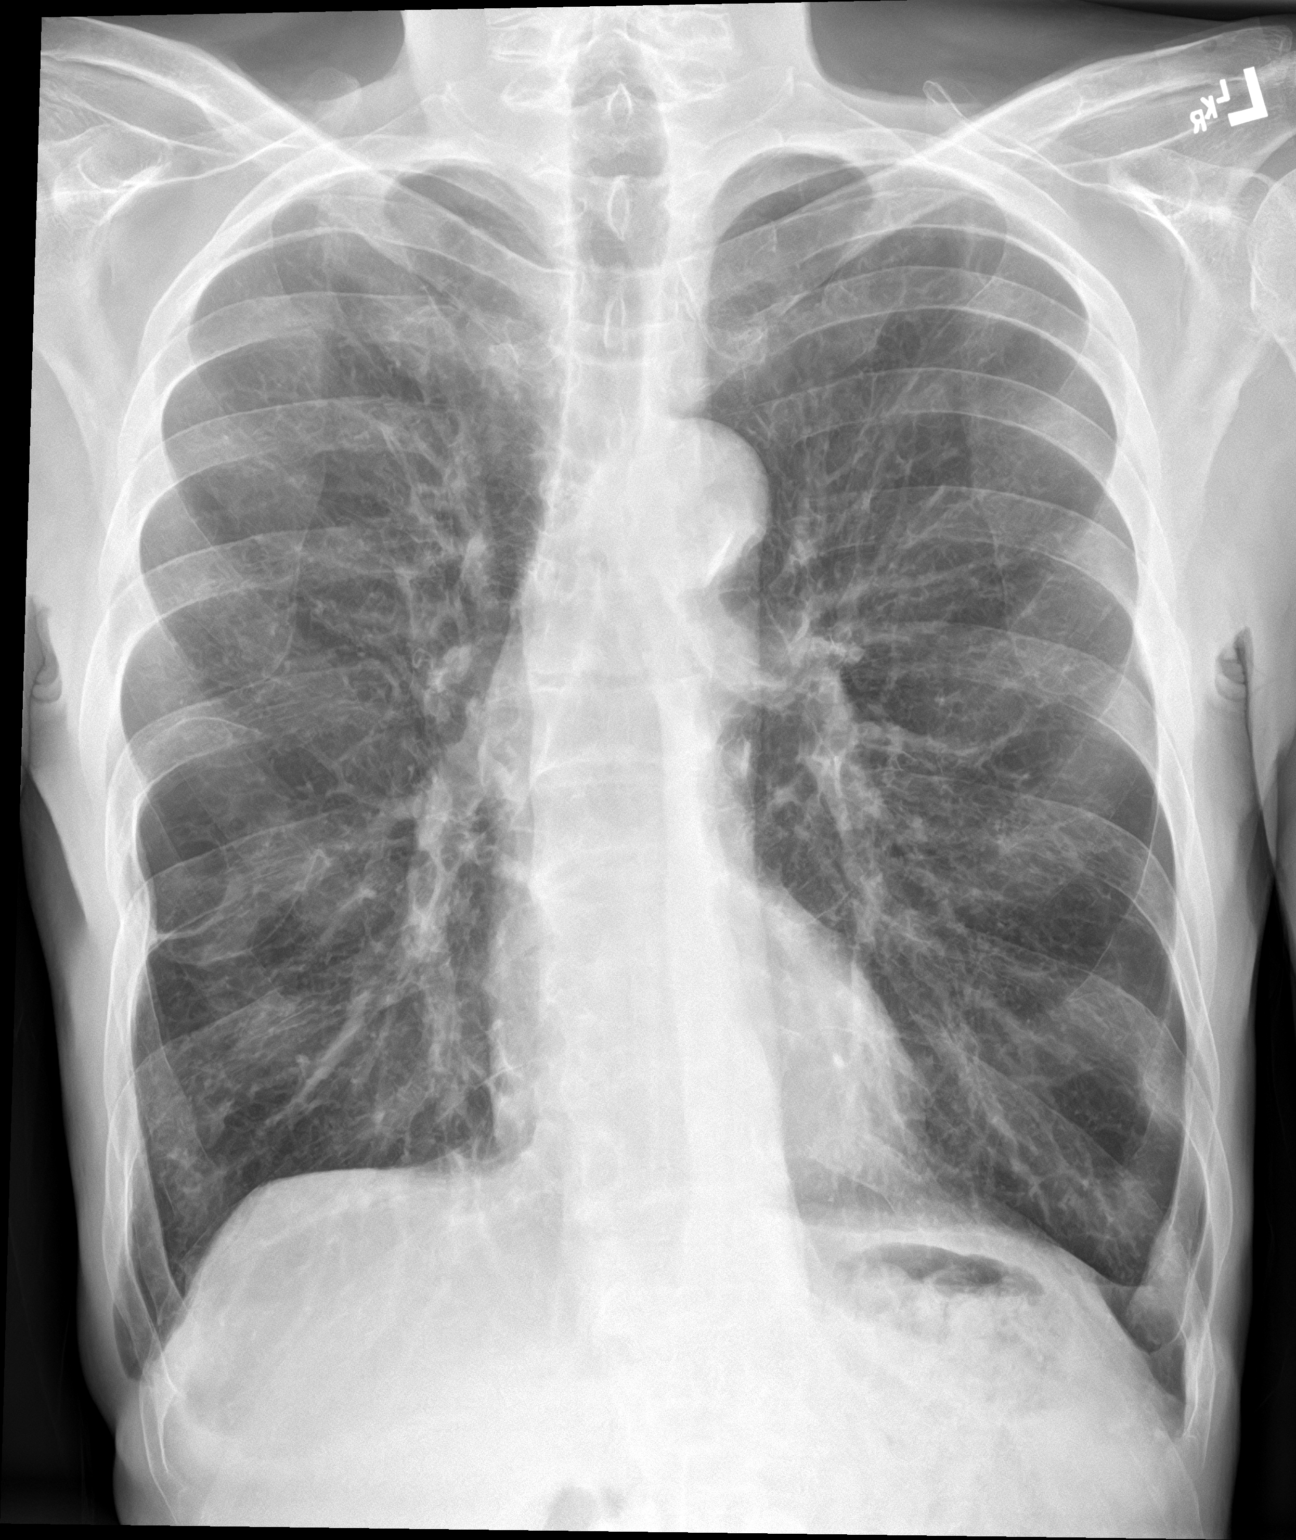

[chest lat]
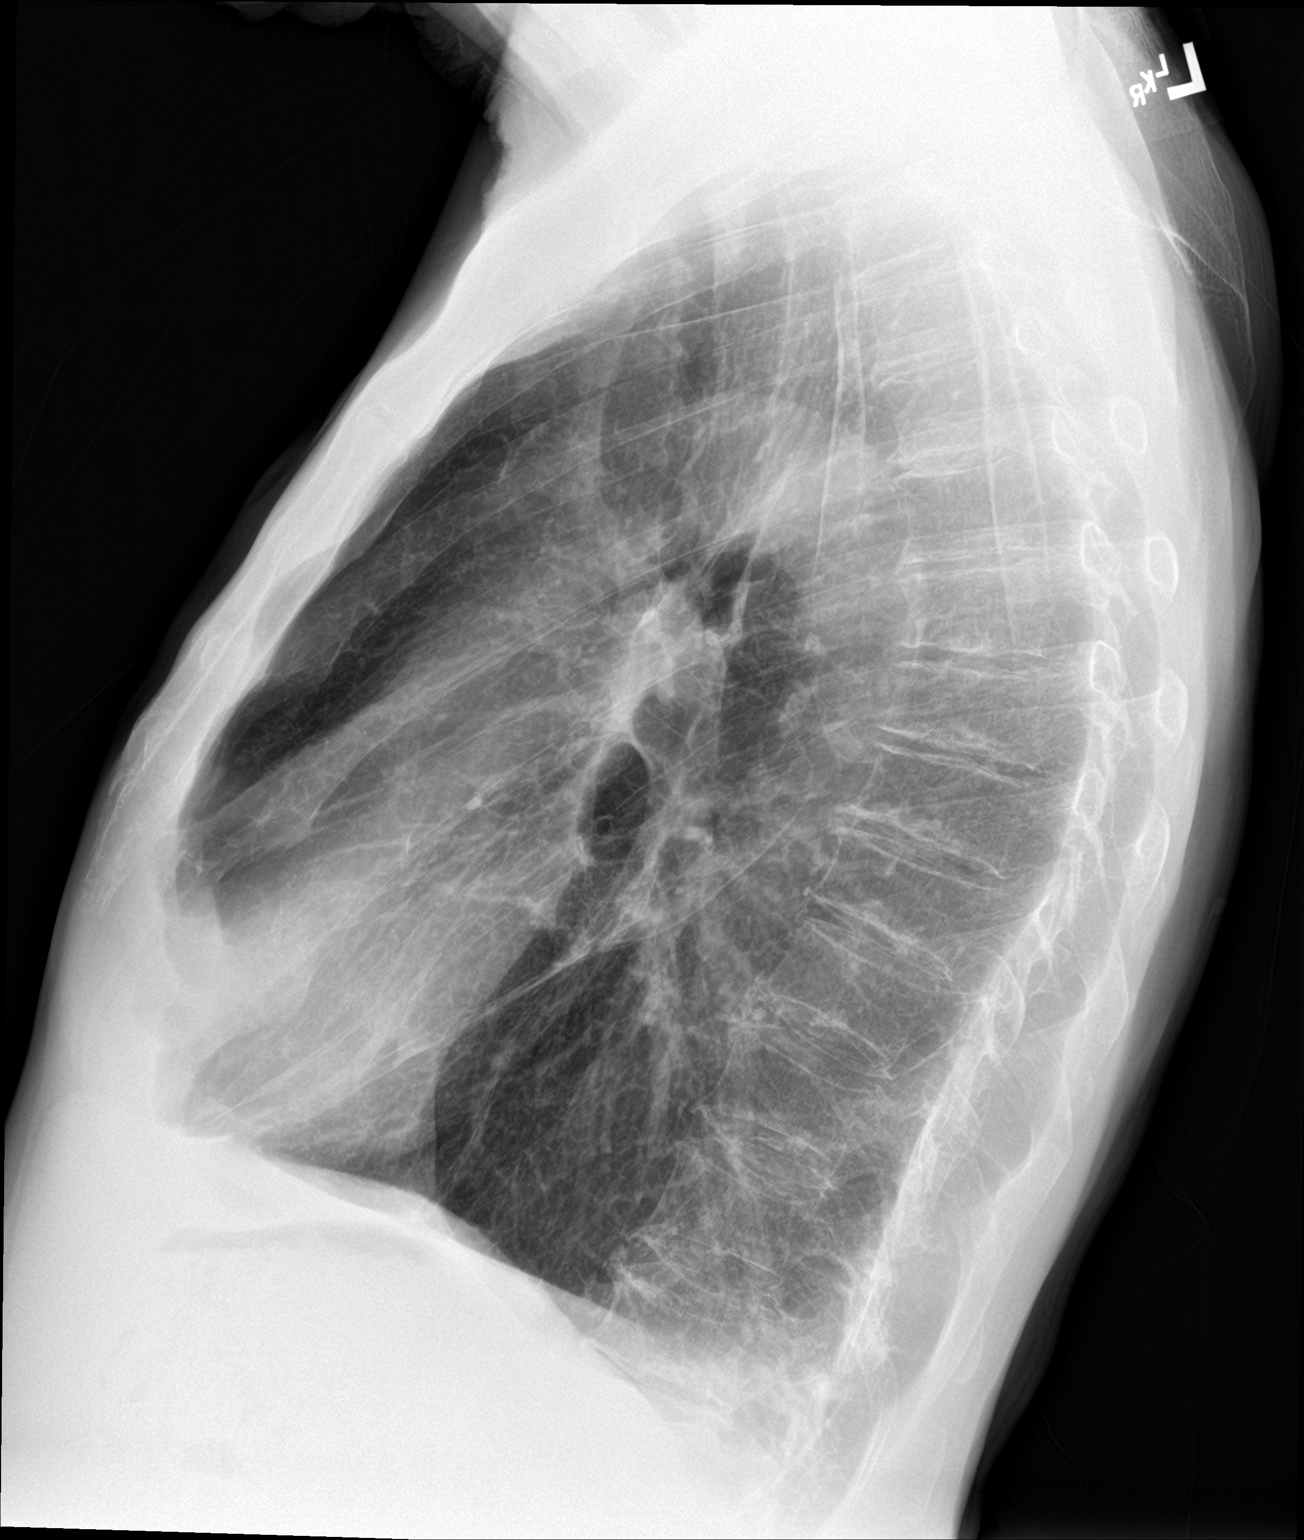

[2 of 2 positions shown; findings below may reference images not displayed]

FINDINGS: Normal heart size, mediastinal contours, and pulmonary vascularity.

Atherosclerotic calcification aorta.

Severe emphysematous changes and central peribronchial thickening.

No acute infiltrate, pleural effusion or pneumothorax.

Bones diffusely demineralized.
IMPRESSION: COPD changes without acute infiltrate.

## 2019-10-19 ENCOUNTER — Emergency Department (HOSPITAL_COMMUNITY): Payer: Medicaid Other

## 2019-10-19 ENCOUNTER — Other Ambulatory Visit: Payer: Self-pay

## 2019-10-19 ENCOUNTER — Emergency Department (HOSPITAL_COMMUNITY)
Admission: EM | Admit: 2019-10-19 | Discharge: 2019-10-19 | Disposition: A | Discharge status: Home / Self Care | Payer: Medicaid Other | Attending: Emergency Medicine | Admitting: Emergency Medicine

## 2019-10-19 DIAGNOSIS — F17210 Nicotine dependence, cigarettes, uncomplicated: Secondary | ICD-10-CM | POA: Diagnosis not present

## 2019-10-19 DIAGNOSIS — F259 Schizoaffective disorder, unspecified: Secondary | ICD-10-CM | POA: Diagnosis not present

## 2019-10-19 DIAGNOSIS — I10 Essential (primary) hypertension: Secondary | ICD-10-CM | POA: Insufficient documentation

## 2019-10-19 DIAGNOSIS — R0789 Other chest pain: Secondary | ICD-10-CM | POA: Insufficient documentation

## 2019-10-19 DIAGNOSIS — Z79899 Other long term (current) drug therapy: Secondary | ICD-10-CM | POA: Diagnosis not present

## 2019-10-19 DIAGNOSIS — R0602 Shortness of breath: Secondary | ICD-10-CM | POA: Insufficient documentation

## 2019-10-19 LAB — CBC WITH DIFFERENTIAL/PLATELET
Abs Immature Granulocytes: 0.03 10*3/uL (ref 0.00–0.07)
Basophils Absolute: 0.1 10*3/uL (ref 0.0–0.1)
Basophils Relative: 1 %
Eosinophils Absolute: 0.6 10*3/uL — ABNORMAL HIGH (ref 0.0–0.5)
Eosinophils Relative: 7 %
HCT: 43.6 % (ref 39.0–52.0)
Hemoglobin: 13.7 g/dL (ref 13.0–17.0)
Immature Granulocytes: 0 %
Lymphocytes Relative: 17 %
Lymphs Abs: 1.4 10*3/uL (ref 0.7–4.0)
MCH: 32 pg (ref 26.0–34.0)
MCHC: 31.4 g/dL (ref 30.0–36.0)
MCV: 101.9 fL — ABNORMAL HIGH (ref 80.0–100.0)
Monocytes Absolute: 0.6 10*3/uL (ref 0.1–1.0)
Monocytes Relative: 7 %
Neutro Abs: 5.7 10*3/uL (ref 1.7–7.7)
Neutrophils Relative %: 68 %
Platelets: 220 10*3/uL (ref 150–400)
RBC: 4.28 MIL/uL (ref 4.22–5.81)
RDW: 13.7 % (ref 11.5–15.5)
WBC: 8.3 10*3/uL (ref 4.0–10.5)
nRBC: 0 % (ref 0.0–0.2)

## 2019-10-19 LAB — BASIC METABOLIC PANEL
Anion gap: 8 (ref 5–15)
BUN: 15 mg/dL (ref 8–23)
CO2: 30 mmol/L (ref 22–32)
Calcium: 9.2 mg/dL (ref 8.9–10.3)
Chloride: 109 mmol/L (ref 98–111)
Creatinine, Ser: 1.57 mg/dL — ABNORMAL HIGH (ref 0.61–1.24)
GFR calc Af Amer: 53 mL/min — ABNORMAL LOW (ref >60)
GFR calc non Af Amer: 46 mL/min — ABNORMAL LOW (ref >60)
Glucose, Bld: 89 mg/dL (ref 70–99)
Potassium: 4.1 mmol/L (ref 3.5–5.1)
Sodium: 147 mmol/L — ABNORMAL HIGH (ref 135–145)

## 2019-10-19 LAB — TROPONIN I (HIGH SENSITIVITY)
Troponin I (High Sensitivity): 5 ng/L (ref <18)
Troponin I (High Sensitivity): 5 ng/L (ref <18)

## 2019-10-19 MED ORDER — SODIUM CHLORIDE 0.9 % IV BOLUS
1000.0000 mL | Freq: Once | INTRAVENOUS | Status: AC
  Administered 2019-10-19: 13:00:00 1000 mL via INTRAVENOUS

## 2019-10-19 MED ORDER — SODIUM CHLORIDE 0.9% FLUSH: 3.0000 mL | Freq: Once | INTRAVENOUS | Status: DC

## 2019-10-19 NOTE — ED Provider Notes (Signed)
[facility] EMERGENCY DEPARTMENT Provider Note   CSN: [id number] Arrival date & time: 10/19/19  1751     History Chief Complaint  Patient presents with  . Chest Pain    Ray Jackson. is a 64 y.o. male.  64 y.o male with a PMH of HTN, Schizophrenia presents to the ED via EMS with a chief complaint of chest pain x this morning.  Patient was at the surgery center today in order to obtain his cataract surgery, reports the pain came out of nowhere, he describes it as a soreness, states it is worse with every beating in his heart.  This is nonradiating, there is no alleviating or exacerbating factors.  He does report smoking history, usually smokes a cigarette in the morning which does help with his morning "grogginess ".  He also endorses shortness of breath, was recently placed on oxygen therapy while at home on 3 L, he does not know the reason for this.  He denies any cardiac history, prior history of MI, history of blood clots or other complaints.  The history is provided by the patient.  Chest Pain Associated symptoms: shortness of breath   Associated symptoms: no abdominal pain, no back pain, no cough, no fever, no palpitations and no vomiting        Past Medical History:  Diagnosis Date  . ARF (acute renal failure) (HCC) 10/02/2017  . Constipation   . Dyspnea   . Hypertension   . Hypokalemia   . Hyponatremia 10/02/2017  . Noncompliance 11/05/2017  . Schizophrenia [facility])     Patient Active Problem List   Diagnosis Date Noted  . Hypokalemia 05/09/2018  . HTN (hypertension) 05/09/2018  . Schizophrenia (HCC) 11/05/2017  . UTI (urinary tract infection) 10/02/2017  . Anemia 10/02/2017    Past Surgical History:  Procedure Laterality Date  . CYSTOSCOPY WITH INSERTION OF UROLIFT N/A 03/24/2018   Procedure: CYSTOSCOPY WITH INSERTION OF UROLIFT;  Surgeon: Malen Gauze, MD;  Location: [facility]S;  Service: Urology;  Laterality: N/A;  . TONSILLECTOMY          No family history on file.  Social History   Tobacco Use  . Smoking status: Current Every Day Smoker    Packs/day: 0.50  . Smokeless tobacco: Never Used  Substance Use Topics  . Alcohol use: No  . Drug use: No    Home Medications Prior to Admission medications   Medication Sig Start Date End Date Taking? Authorizing Provider  acetaminophen (TYLENOL) 325 MG tablet Take 2 tablets (650 mg total) by mouth every 6 (six) hours as needed for mild pain (or Fever >/= 101). 05/10/18   Emokpae, Courage, MD  amLODipine (NORVASC) 5 MG tablet Take 1 tablet (5 mg total) by mouth daily. 05/11/18   Shon Hale, MD  cetirizine (ZYRTEC) 10 MG tablet Take 10 mg by mouth at bedtime.     [provider]  Cholecalciferol (VITAMIN D3) 2000 units capsule Take 2,000 Units by mouth daily.    [provider]  clonazePAM (KLONOPIN) 1 MG tablet Take 1 mg by mouth at bedtime.     [provider]  finasteride (PROSCAR) 5 MG tablet Take 5 mg by mouth daily.    [provider]  FLUoxetine (PROZAC) 20 MG tablet Take 20 mg by mouth daily.    [provider]  fluticasone (FLONASE) 50 MCG/ACT nasal spray Place 2 sprays into both nostrils daily.    [provider]  hydrALAZINE (APRESOLINE)  50 MG tablet Take 1 tablet (50 mg total) by mouth 3 (three) times daily. 05/10/18   Shon Hale, MD  isosorbide mononitrate (IMDUR) 30 MG 24 hr tablet Take 1 tablet (30 mg total) by mouth daily. 05/11/18   Shon Hale, MD  lactulose (CHRONULAC) 10 GM/15ML solution Take 30 mLs (20 g total) by mouth daily. 05/10/18   Shon Hale, MD  lithium carbonate (ESKALITH) 450 MG CR tablet Take 450 mg by mouth 2 (two) times daily.    [provider]  mirtazapine (REMERON) 30 MG tablet Take 30 mg by mouth at bedtime.    [provider]  paliperidone (INVEGA SUSTENNA) 156 MG/ML SUSP injection Inject 1 mL (156 mg total) into the muscle every 30 (thirty) days.  Next dose due on 4/16 11/14/17   McNew, Ileene Hutchinson, MD  polyethylene glycol (MIRALAX / GLYCOLAX) packet Take 17 g by mouth daily.    [provider]  Potassium Chloride ER 20 MEQ TBCR Take 20 mEq by mouth daily for 3 days. 1 tab daily by mouth 05/10/18 05/13/18  Shon Hale, MD  risperiDONE (RISPERDAL M-TABS) 1 MG disintegrating tablet Take 1 tablet by mouth at bedtime. 09/12/17   [provider]  traZODone (DESYREL) 50 MG tablet Take 1 tablet (50 mg total) by mouth at bedtime. 05/10/18   Shon Hale, MD  vitamin B-12 (CYANOCOBALAMIN) 1000 MCG tablet Take 1,000 mcg by mouth daily.    [provider]    Allergies    Patient has no known allergies.  Review of Systems   Review of Systems  Constitutional: Negative for chills and fever.  HENT: Negative for ear pain and sore throat.   Eyes: Negative for pain and visual disturbance.  Respiratory: Positive for shortness of breath. Negative for cough.   Cardiovascular: Positive for chest pain. Negative for palpitations.  Gastrointestinal: Negative for abdominal pain and vomiting.  Genitourinary: Negative for dysuria and hematuria.  Musculoskeletal: Negative for arthralgias and back pain.  Skin: Negative for color change and rash.  Neurological: Negative for seizures and syncope.  All other systems reviewed and are negative.   Physical Exam Updated Vital Signs BP (!) 147/101   Pulse 72   Temp 98.3 F (36.8 C) (Oral)   Resp 16   Ht 6' (1.829 m)   Wt 70.3 kg   SpO2 99%   BMI 21.02 kg/m   Physical Exam Vitals and nursing note reviewed.  Constitutional:      Appearance: He is well-developed.  HENT:     Head: Normocephalic and atraumatic.  Eyes:     General: No scleral icterus.    Pupils: Pupils are equal, round, and reactive to light.  Cardiovascular:     Heart sounds: Normal heart sounds.  Pulmonary:     Effort: Pulmonary effort is normal.     Breath sounds: Normal breath sounds. No decreased breath  sounds or wheezing.  Chest:     Chest wall: No tenderness.  Abdominal:     General: Bowel sounds are normal. There is no distension.     Palpations: Abdomen is soft.     Tenderness: There is no abdominal tenderness.  Musculoskeletal:        General: No tenderness or deformity.     Cervical back: Normal range of motion.     Right lower leg: No edema.     Left lower leg: No edema.  Skin:    General: Skin is warm and dry.  Neurological:  Mental Status: He is alert and oriented to person, place, and time.     ED Results / Procedures / Treatments   Labs (all labs ordered are listed, but only abnormal results are displayed) Labs Reviewed  BASIC METABOLIC PANEL - Abnormal; Notable for the following components:      Result Value   Sodium 147 (*)    Creatinine, Ser 1.57 (*)    GFR calc non Af Amer 46 (*)    GFR calc Af Amer 53 (*)    All other components within normal limits  CBC WITH DIFFERENTIAL/PLATELET - Abnormal; Notable for the following components:   MCV 101.9 (*)    Eosinophils Absolute 0.6 (*)    All other components within normal limits  TROPONIN I (HIGH SENSITIVITY)  TROPONIN I (HIGH SENSITIVITY)    EKG EKG Interpretation  Date/Time:  Monday October 19 2019 09:45:39 EST Ventricular Rate:  76 PR Interval:    QRS Duration: 107 QT Interval:  416 QTC Calculation: 462 R Axis:   63 Text Interpretation: Sinus rhythm no acute ischemic changes, no sif change from previous Confirmed by Charlesetta Shanks [phone]) on 10/19/2019 12:24:05 PM   Radiology DG Chest 2 View  Result Date: 10/19/2019 CLINICAL DATA:  Chest pain. EXAM: CHEST - 2 VIEW COMPARISON:  11/05/2017. FINDINGS: Trachea is midline. Heart size stable. Thoracic aorta is calcified. Lungs are hyperinflated with a bullous lesion in the right midlung zone. No pleural fluid. Mild thoracic compression deformities, similar. Osteopenia. Old rib fractures. IMPRESSION: Hyperinflation without acute finding. Electronically Signed    By: Lorin Picket M.D.   On: 10/19/2019 10:23    Procedures Procedures (including critical care time)  Medications Ordered in ED Medications  sodium chloride flush (NS) 0.9 % injection 3 mL (3 mLs Intravenous Not Given 10/19/19 1130)  sodium chloride 0.9 % bolus 1,000 mL (1,000 mLs Intravenous New Bag/Given 10/19/19 1326)    ED Course  I have reviewed the triage vital signs and the nursing notes.  Pertinent labs & imaging results that were available during my care of the patient were reviewed by me and considered in my medical decision making (see chart for details).    MDM Rules/Calculators/A&P   Patient with a past medical history of schizophrenia, retention presents to the ED via EMS from surgery center after having cataract surgery scheduled today and started to complain of right-sided chest pain.  Patient received aspirin 324+ nitro sublingual and reports symptoms have subsided.  He is currently on home oxygen of 2 L, does not know why he is on home oxygen.  He is overall chronically ill-appearing, standing at 94% on room air without oxygen.  Heart rate is within normal limits at 80, afebrile.  Pressure is within normal limits.  Lungs are slightly diminished to auscultation, reports non producible pain to his chest, no signs of volume overload.  He is somewhat cachectic, no bilateral calf tenderness or pitting edema.  Diminished soft, nontender to palpation.  Interpretation of his labs showed a CBC without any leukocytosis, no signs of anemia although he does have a prior history of this.  BMP showed mild hyper natremia, AKI at 1.57, elevated from his previous visit 1 year ago of 1.0. First troponin was flat, will obtain delta.  Doubting ACS, he does report he felt this was somewhat out of his routine, but he could not get his morning routine done as he had to wake up early for his procedure.  12:03 PM Spoke to sister Ray Jackson  legal guardian who reports she has a power of attorney under  his care as patient is unable to take care of himself.  Repeat troponin is negative, he reports improvement in his symptoms since arrival into the ED.I have called sister in order to update her on this.  She is agreeable with plan and management.  I have discussed case with Dr. Clarice Pole was also evaluated patient and agrees with plan and management.   Portions of this note were generated with Scientist, clinical (histocompatibility and immunogenetics). Dictation errors may occur despite best attempts at proofreading.  Final Clinical Impression(s) / ED Diagnoses Final diagnoses:  Atypical chest pain    Rx / DC Orders ED Discharge Orders    None       Claude Manges, PA-C 10/19/19 1445    Arby Barrette, MD 10/20/19 1752

## 2019-10-19 NOTE — ED Triage Notes (Signed)
Pt arrived from day surgery (supposed to have cataract surgery on right side) patient c/o chest pain; stated has had intermittent episodes since October 2020. EMS gave ASA 324 mg + NGT 0.4 SL x1. Patient denies chest pain on arrival to unit. EMS endorsed patient recently started on home 02 at 2L.

## 2019-10-19 NOTE — Discharge Instructions (Addendum)
Your creatine level is slightly elevated on today's visit.  We provided you with 1 L of fluids in order to help restore this.  You will need to continue to hydrate with plenty of fluids.  Please follow-up with your primary care physician as needed.

## 2019-10-19 NOTE — ED Notes (Signed)
Patient transported to X-ray 

## 2019-10-19 NOTE — ED Notes (Signed)
Attempted to contact patient's sister and legal guardian to inform of discharge, mailbox full.

## 2019-10-19 NOTE — ED Notes (Signed)
Gave pt ice chips, per Magnus Ivan - RN.

## 2019-10-19 NOTE — ED Provider Notes (Addendum)
Medical screening examination/treatment/procedure(s) were conducted as a shared visit with non-physician practitioner(s) and myself.  I personally evaluated the patient during the encounter.  EKG Interpretation  Date/Time:  Monday October 19 2019 09:45:39 EST Ventricular Rate:  76 PR Interval:    QRS Duration: 107 QT Interval:  416 QTC Calculation: 462 R Axis:   63 Text Interpretation: Sinus rhythm no acute ischemic changes, no sif change from previous Confirmed by Arby Barrette [phone]) on 10/19/2019 12:24:05 PM    Arby Barrette, MD 10/19/19 1435 Patient reports that he got a few sharp chest pains this morning when he went in to get his cataract surgery.  He reports he thinks he was really anxious.  He had to get up early in the morning and did not get to eat or smoke.  He reports he also is anxious because his roommate makes him pretty nervous and they got a lot of new movies to watch and he got some money today.  He also reports he is always worried about his heart ever since he had a "heart test where they put some cream on my chest" and he was told that something was "sprung".  He reports he feels comfortable now he is not having any chest pain.  Patient is alert and nontoxic.  He has been eating and drinking without difficulty.  Heart is regular.  No rub murmur gallop.  Lungs grossly clear.    Troponin is negative.  EKG does not show acute changes.  At this time, I have low suspicion for acute cardiac ischemic etiology.  Patient is stable for continued outpatient follow-up.   Arby Barrette, MD 10/19/19 1440

## 2019-12-10 DIAGNOSIS — N1831 Chronic kidney disease, stage 3a: Secondary | ICD-10-CM | POA: Insufficient documentation

## 2020-01-22 ENCOUNTER — Other Ambulatory Visit (HOSPITAL_COMMUNITY): Payer: Self-pay | Admitting: Nephrology

## 2020-01-22 DIAGNOSIS — I129 Hypertensive chronic kidney disease with stage 1 through stage 4 chronic kidney disease, or unspecified chronic kidney disease: Secondary | ICD-10-CM

## 2020-01-22 DIAGNOSIS — N1831 Chronic kidney disease, stage 3a: Secondary | ICD-10-CM

## 2020-01-22 DIAGNOSIS — E876 Hypokalemia: Secondary | ICD-10-CM

## 2020-01-22 DIAGNOSIS — E559 Vitamin D deficiency, unspecified: Secondary | ICD-10-CM

## 2020-01-22 DIAGNOSIS — Z79899 Other long term (current) drug therapy: Secondary | ICD-10-CM

## 2020-01-29 ENCOUNTER — Ambulatory Visit (HOSPITAL_COMMUNITY)
Admission: RE | Admit: 2020-01-29 | Discharge: 2020-01-29 | Disposition: A | Discharge status: Home / Self Care | Payer: Medicaid Other | Source: Ambulatory Visit | Attending: Nephrology | Admitting: Nephrology

## 2020-01-29 ENCOUNTER — Other Ambulatory Visit: Payer: Self-pay

## 2020-01-29 DIAGNOSIS — Z79899 Other long term (current) drug therapy: Secondary | ICD-10-CM

## 2020-01-29 DIAGNOSIS — E559 Vitamin D deficiency, unspecified: Secondary | ICD-10-CM

## 2020-01-29 DIAGNOSIS — N1831 Chronic kidney disease, stage 3a: Secondary | ICD-10-CM | POA: Diagnosis present

## 2020-01-29 DIAGNOSIS — E876 Hypokalemia: Secondary | ICD-10-CM

## 2020-01-29 DIAGNOSIS — I129 Hypertensive chronic kidney disease with stage 1 through stage 4 chronic kidney disease, or unspecified chronic kidney disease: Secondary | ICD-10-CM | POA: Diagnosis present

## 2020-06-03 ENCOUNTER — Emergency Department (HOSPITAL_COMMUNITY)
Admission: EM | Admit: 2020-06-03 | Discharge: 2020-06-04 | Disposition: A | Discharge status: Home / Self Care | Payer: Medicaid Other | Attending: Emergency Medicine | Admitting: Emergency Medicine

## 2020-06-03 ENCOUNTER — Other Ambulatory Visit: Payer: Self-pay

## 2020-06-03 ENCOUNTER — Encounter (HOSPITAL_COMMUNITY): Payer: Self-pay | Admitting: *Deleted

## 2020-06-03 DIAGNOSIS — F17200 Nicotine dependence, unspecified, uncomplicated: Secondary | ICD-10-CM | POA: Insufficient documentation

## 2020-06-03 DIAGNOSIS — I10 Essential (primary) hypertension: Secondary | ICD-10-CM | POA: Insufficient documentation

## 2020-06-03 DIAGNOSIS — Z79899 Other long term (current) drug therapy: Secondary | ICD-10-CM | POA: Insufficient documentation

## 2020-06-03 DIAGNOSIS — F332 Major depressive disorder, recurrent severe without psychotic features: Secondary | ICD-10-CM | POA: Diagnosis not present

## 2020-06-03 DIAGNOSIS — R45851 Suicidal ideations: Secondary | ICD-10-CM | POA: Insufficient documentation

## 2020-06-03 DIAGNOSIS — F32A Depression, unspecified: Secondary | ICD-10-CM

## 2020-06-03 LAB — COMPREHENSIVE METABOLIC PANEL
ALT: 11 U/L (ref 0–44)
AST: 14 U/L — ABNORMAL LOW (ref 15–41)
Albumin: 3.7 g/dL (ref 3.5–5.0)
Alkaline Phosphatase: 54 U/L (ref 38–126)
Anion gap: 9 (ref 5–15)
BUN: 14 mg/dL (ref 8–23)
CO2: 26 mmol/L (ref 22–32)
Calcium: 9.7 mg/dL (ref 8.9–10.3)
Chloride: 102 mmol/L (ref 98–111)
Creatinine, Ser: 1.26 mg/dL — ABNORMAL HIGH (ref 0.61–1.24)
GFR, Estimated: 60 mL/min — ABNORMAL LOW (ref >60)
Glucose, Bld: 94 mg/dL (ref 70–99)
Potassium: 3.4 mmol/L — ABNORMAL LOW (ref 3.5–5.1)
Sodium: 137 mmol/L (ref 135–145)
Total Bilirubin: 0.7 mg/dL (ref 0.3–1.2)
Total Protein: 6.7 g/dL (ref 6.5–8.1)

## 2020-06-03 LAB — RAPID URINE DRUG SCREEN, HOSP PERFORMED
Amphetamines: NOT DETECTED
Barbiturates: NOT DETECTED
Benzodiazepines: NOT DETECTED
Cocaine: NOT DETECTED
Opiates: NOT DETECTED
Tetrahydrocannabinol: NOT DETECTED

## 2020-06-03 LAB — CBC WITH DIFFERENTIAL/PLATELET
Abs Immature Granulocytes: 0.02 10*3/uL (ref 0.00–0.07)
Basophils Absolute: 0 10*3/uL (ref 0.0–0.1)
Basophils Relative: 0 %
Eosinophils Absolute: 0.1 10*3/uL (ref 0.0–0.5)
Eosinophils Relative: 2 %
HCT: 39.6 % (ref 39.0–52.0)
Hemoglobin: 13.2 g/dL (ref 13.0–17.0)
Immature Granulocytes: 0 %
Lymphocytes Relative: 15 %
Lymphs Abs: 1.1 10*3/uL (ref 0.7–4.0)
MCH: 32.8 pg (ref 26.0–34.0)
MCHC: 33.3 g/dL (ref 30.0–36.0)
MCV: 98.3 fL (ref 80.0–100.0)
Monocytes Absolute: 0.7 10*3/uL (ref 0.1–1.0)
Monocytes Relative: 10 %
Neutro Abs: 5.6 10*3/uL (ref 1.7–7.7)
Neutrophils Relative %: 73 %
Platelets: 247 10*3/uL (ref 150–400)
RBC: 4.03 MIL/uL — ABNORMAL LOW (ref 4.22–5.81)
RDW: 13.5 % (ref 11.5–15.5)
WBC: 7.6 10*3/uL (ref 4.0–10.5)
nRBC: 0 % (ref 0.0–0.2)

## 2020-06-03 LAB — ETHANOL: Alcohol, Ethyl (B): <10 mg/dL (ref <10)

## 2020-06-03 LAB — URINALYSIS, ROUTINE W REFLEX MICROSCOPIC
Bacteria, UA: NONE SEEN
Bilirubin Urine: NEGATIVE
Glucose, UA: NEGATIVE mg/dL
Hgb urine dipstick: NEGATIVE
Ketones, ur: NEGATIVE mg/dL
Nitrite: NEGATIVE
Protein, ur: NEGATIVE mg/dL
Specific Gravity, Urine: 1.001 — ABNORMAL LOW (ref 1.005–1.030)
pH: 7 (ref 5.0–8.0)

## 2020-06-03 LAB — LITHIUM LEVEL: Lithium Lvl: 0.66 mmol/L (ref 0.60–1.20)

## 2020-06-03 MED ORDER — NICOTINE 21 MG/24HR TD PT24
21.0000 mg | MEDICATED_PATCH | Freq: Once | TRANSDERMAL | Status: AC
  Administered 2020-06-03: 21 mg via TRANSDERMAL
  Filled 2020-06-03: qty 1

## 2020-06-03 MED ORDER — TIOTROPIUM BROMIDE MONOHYDRATE 18 MCG IN CAPS: 18.0000 ug | ORAL_CAPSULE | Freq: Every day | RESPIRATORY_TRACT | Status: DC

## 2020-06-03 MED ORDER — VITAMIN D 25 MCG (1000 UNIT) PO TABS
2000.0000 [IU] | ORAL_TABLET | Freq: Every day | ORAL | Status: DC
  Administered 2020-06-04: 2000 [IU] via ORAL
  Filled 2020-06-03 (×2): qty 2

## 2020-06-03 MED ORDER — ISOSORBIDE MONONITRATE ER 30 MG PO TB24
30.0000 mg | ORAL_TABLET | Freq: Every day | ORAL | Status: DC
  Administered 2020-06-04: 30 mg via ORAL
  Filled 2020-06-03 (×4): qty 1

## 2020-06-03 MED ORDER — TAMSULOSIN HCL 0.4 MG PO CAPS
0.4000 mg | ORAL_CAPSULE | Freq: Every day | ORAL | Status: DC
  Administered 2020-06-03 – 2020-06-04 (×2): 0.4 mg via ORAL
  Filled 2020-06-03 (×2): qty 1

## 2020-06-03 MED ORDER — TRAZODONE HCL 50 MG PO TABS
50.0000 mg | ORAL_TABLET | Freq: Every day | ORAL | Status: DC
  Administered 2020-06-03: 50 mg via ORAL
  Filled 2020-06-03: qty 1

## 2020-06-03 MED ORDER — RISPERIDONE 1 MG PO TBDP
1.0000 mg | ORAL_TABLET | Freq: Every day | ORAL | Status: DC
  Administered 2020-06-03: 1 mg via ORAL
  Filled 2020-06-03: qty 1

## 2020-06-03 MED ORDER — HYDRALAZINE HCL 25 MG PO TABS
50.0000 mg | ORAL_TABLET | Freq: Three times a day (TID) | ORAL | Status: DC
  Administered 2020-06-03 – 2020-06-04 (×2): 50 mg via ORAL
  Filled 2020-06-03 (×2): qty 2

## 2020-06-03 MED ORDER — ONDANSETRON HCL 4 MG PO TABS: 4.0000 mg | ORAL_TABLET | Freq: Three times a day (TID) | ORAL | Status: DC | PRN

## 2020-06-03 MED ORDER — FINASTERIDE 5 MG PO TABS
5.0000 mg | ORAL_TABLET | Freq: Every day | ORAL | Status: DC
  Administered 2020-06-03 – 2020-06-04 (×2): 5 mg via ORAL
  Filled 2020-06-03 (×5): qty 1

## 2020-06-03 MED ORDER — MIRTAZAPINE 15 MG PO TABS
30.0000 mg | ORAL_TABLET | Freq: Every day | ORAL | Status: DC
  Administered 2020-06-03: 30 mg via ORAL
  Filled 2020-06-03: qty 2

## 2020-06-03 MED ORDER — ACETAMINOPHEN 325 MG PO TABS: 650.0000 mg | ORAL_TABLET | ORAL | Status: DC | PRN

## 2020-06-03 MED ORDER — FLUTICASONE PROPIONATE 50 MCG/ACT NA SUSP
2.0000 | Freq: Every day | NASAL | Status: DC
  Administered 2020-06-04: 2 via NASAL
  Filled 2020-06-03 (×2): qty 16

## 2020-06-03 MED ORDER — VITAMIN B-12 1000 MCG PO TABS
1000.0000 ug | ORAL_TABLET | Freq: Every day | ORAL | Status: DC
  Administered 2020-06-03 – 2020-06-04 (×2): 1000 ug via ORAL
  Filled 2020-06-03 (×2): qty 1

## 2020-06-03 MED ORDER — UMECLIDINIUM BROMIDE 62.5 MCG/INH IN AEPB
1.0000 | INHALATION_SPRAY | Freq: Every day | RESPIRATORY_TRACT | Status: DC
  Administered 2020-06-04: 10:00:00 1 via RESPIRATORY_TRACT
  Filled 2020-06-03: qty 7

## 2020-06-03 MED ORDER — CLONAZEPAM 0.5 MG PO TABS
1.0000 mg | ORAL_TABLET | Freq: Every day | ORAL | Status: DC
  Administered 2020-06-03: 1 mg via ORAL
  Filled 2020-06-03: qty 2

## 2020-06-03 MED ORDER — AMLODIPINE BESYLATE 5 MG PO TABS
5.0000 mg | ORAL_TABLET | Freq: Every day | ORAL | Status: DC
  Administered 2020-06-03 – 2020-06-04 (×2): 5 mg via ORAL
  Filled 2020-06-03 (×2): qty 1

## 2020-06-03 MED ORDER — FLUOXETINE HCL 20 MG PO CAPS
20.0000 mg | ORAL_CAPSULE | Freq: Every day | ORAL | Status: DC
  Administered 2020-06-03 – 2020-06-04 (×2): 20 mg via ORAL
  Filled 2020-06-03 (×2): qty 1

## 2020-06-03 MED ORDER — LITHIUM CARBONATE ER 450 MG PO TBCR
450.0000 mg | EXTENDED_RELEASE_TABLET | Freq: Two times a day (BID) | ORAL | Status: DC
  Administered 2020-06-04 (×2): 450 mg via ORAL
  Filled 2020-06-03 (×6): qty 1
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
## 2020-06-03 NOTE — ED Provider Notes (Signed)
[facility] EMERGENCY DEPARTMENT Provider Note   CSN: [id number] Arrival date & time: 06/03/20  1239     History Chief Complaint  Patient presents with  . Medical Clearance    Ray Jackson is a 64 y.o. male with medical history including acute renal failure, hypertension, and history of anemia, also history of schizo, reporting prior episodes with depression with suicidal ideation presenting for evaluation of increased depression and suicidal ideation and thoughts.  He is residing in a rest home for the past year, reports that he has been having increasing difficulty sleeping and has poor appetite and has thoughts of killing himself, describing thinking about jumping off of a banister per triage nursing notes, specifically stated to me he is thought about stabbing himself in the chest with a knife.  He denies auditory or visual hallucinations, states he hears voices "internally", states that is just him talking to himself.  He denies any suicide attempt, overdose.  He is here voluntarily.  Additionally, endorses frustration over perceived sexual impotence, also notes having abnormal sexual thoughts regarding others, particularly when he sees a male in tight clothing, states he has difficulty controlling his behaviors. States he was in prison previously for this, but did not elaborate.  He oftentimes has insomnia, is fearful about going to hell due to his inability to control his thoughts.  He also expresses frustration over his current living environment, states there are cameras in the facility and he feels like he is being watched all the time.  He also mentions his ability to smoke his cigarettes has been restricted causing frustration.  HPI     Past Medical History:  Diagnosis Date  . ARF (acute renal failure) (HCC) 10/02/2017  . Constipation   . Dyspnea   . Hypertension   . Hypokalemia   . Hyponatremia 10/02/2017  . Noncompliance 11/05/2017  . Schizophrenia [facility])     Patient  Active Problem List   Diagnosis Date Noted  . Hypokalemia 05/09/2018  . HTN (hypertension) 05/09/2018  . Schizophrenia (HCC) 11/05/2017  . UTI (urinary tract infection) 10/02/2017  . Anemia 10/02/2017    Past Surgical History:  Procedure Laterality Date  . CYSTOSCOPY WITH INSERTION OF UROLIFT N/A 03/24/2018   Procedure: CYSTOSCOPY WITH INSERTION OF UROLIFT;  Surgeon: Malen Gauze, MD;  Location: [facility];  Service: Urology;  Laterality: N/A;  . TONSILLECTOMY         History reviewed. No pertinent family history.  Social History   Tobacco Use  . Smoking status: Current Every Day Smoker    Packs/day: 0.50  . Smokeless tobacco: Never Used  Vaping Use  . Vaping Use: Never used  Substance Use Topics  . Alcohol use: No  . Drug use: No    Home Medications Prior to Admission medications   Medication Sig Start Date End Date Taking? Authorizing Provider  acetaminophen (TYLENOL) 325 MG tablet Take 2 tablets (650 mg total) by mouth every 6 (six) hours as needed for mild pain (or Fever >/= 101). 05/10/18  Yes Emokpae, Courage, MD  amLODipine (NORVASC) 5 MG tablet Take 1 tablet (5 mg total) by mouth daily. 05/11/18  Yes Emokpae, Courage, MD  cetirizine (ZYRTEC) 10 MG tablet Take 10 mg by mouth daily.    Yes [provider]  Cholecalciferol (VITAMIN D3) 2000 units capsule Take 2,000 Units by mouth daily.   Yes [provider]  clonazePAM (KLONOPIN) 1 MG tablet Take 1 mg by mouth at bedtime.    Yes [provider]  finasteride (PROSCAR) 5 MG tablet Take 5 mg by mouth daily.   Yes [provider]  FLUoxetine (PROZAC) 20 MG tablet Take 20 mg by mouth daily.   Yes [provider]  fluticasone (FLONASE) 50 MCG/ACT nasal spray Place 2 sprays into both nostrils daily.   Yes [provider]  hydrALAZINE (APRESOLINE) 50 MG tablet Take 1 tablet (50 mg total) by mouth 3 (three) times daily. 05/10/18  Yes Emokpae, Courage, MD  isosorbide  mononitrate (IMDUR) 30 MG 24 hr tablet Take 1 tablet (30 mg total) by mouth daily. 05/11/18  Yes Shon Hale, MD  lithium carbonate (ESKALITH) 450 MG CR tablet Take 450 mg by mouth 2 (two) times daily.   Yes [provider]  mirtazapine (REMERON) 30 MG tablet Take 30 mg by mouth at bedtime.   Yes [provider]  Paliperidone Palmitate ER (INVEGA TRINZA) 546 MG/1.75ML SUSY Inject 546 mg into the muscle See admin instructions. Every 3 months   Yes [provider]  polyethylene glycol (MIRALAX / GLYCOLAX) packet Take 17 g by mouth daily.   Yes [provider]  risperiDONE (RISPERDAL M-TABS) 1 MG disintegrating tablet Take 1 tablet by mouth at bedtime. 09/12/17  Yes [provider]  senna-docusate (SENOKOT-S) 8.6-50 MG tablet Take 1 tablet by mouth daily.   Yes [provider]  tamsulosin (FLOMAX) 0.4 MG CAPS capsule Take 0.4 mg by mouth daily.   Yes [provider]  tiotropium (SPIRIVA) 18 MCG inhalation capsule Place 18 mcg into inhaler and inhale daily.   Yes [provider]  traZODone (DESYREL) 50 MG tablet Take 1 tablet (50 mg total) by mouth at bedtime. 05/10/18  Yes Emokpae, Courage, MD  vitamin B-12 (CYANOCOBALAMIN) 1000 MCG tablet Take 1,000 mcg by mouth daily.   Yes [provider]  lactulose (CHRONULAC) 10 GM/15ML solution Take 30 mLs (20 g total) by mouth daily. Patient not taking: Reported on 06/03/2020 05/10/18   Shon Hale, MD  paliperidone (INVEGA SUSTENNA) 156 MG/ML SUSP injection Inject 1 mL (156 mg total) into the muscle every 30 (thirty) days. Next dose due on 4/16 Patient not taking: Reported on 06/03/2020 11/14/17   Haskell Riling, MD  Potassium Chloride ER 20 MEQ TBCR Take 20 mEq by mouth daily for 3 days. 1 tab daily by mouth 05/10/18 05/13/18  Shon Hale, MD    Allergies    Patient has no known allergies.  Review of Systems   Review of Systems  Constitutional: Negative for chills  and fever.  HENT: Negative for congestion.   Eyes: Negative.   Respiratory: Negative for chest tightness and shortness of breath.   Cardiovascular: Negative for chest pain.  Gastrointestinal: Negative for abdominal pain, nausea and vomiting.  Genitourinary: Negative.   Musculoskeletal: Negative for arthralgias, joint swelling and neck pain.  Skin: Negative.  Negative for rash and wound.  Neurological: Negative for dizziness, weakness, light-headedness, numbness and headaches.  Psychiatric/Behavioral: Positive for dysphoric mood, sleep disturbance and suicidal ideas. Negative for hallucinations.    Physical Exam Updated Vital Signs BP 107/80 (BP Location: Right Arm)   Pulse 90   Temp 99.6 F (37.6 C) (Oral)   Resp 16   Ht 6' (1.829 m)   Wt 61.2 kg   SpO2 96%   BMI 18.31 kg/m   Physical Exam Vitals and nursing note reviewed.  Constitutional:      Appearance: He is well-developed.  HENT:     Head: Normocephalic and atraumatic.  Eyes:     Conjunctiva/sclera: Conjunctivae normal.  Cardiovascular:     Rate and Rhythm: Normal rate and regular rhythm.     Heart sounds: Normal heart sounds.  Pulmonary:     Effort: Pulmonary effort is normal.     Breath sounds: Normal breath sounds. No wheezing.  Abdominal:     General: Bowel sounds are normal.     Palpations: Abdomen is soft.     Tenderness: There is no abdominal tenderness.  Musculoskeletal:        General: Normal range of motion.     Cervical back: Normal range of motion.  Skin:    General: Skin is warm and dry.  Neurological:     General: No focal deficit present.     Mental Status: He is alert and oriented to person, place, and time.  Psychiatric:        Attention and Perception: He does not perceive auditory or visual hallucinations.        Mood and Affect: Mood is depressed. Affect is flat.        Speech: Speech normal.        Behavior: Behavior is cooperative.        Thought Content: Thought content includes  suicidal ideation. Thought content includes suicidal plan.        Cognition and Memory: Cognition normal.        Judgment: Judgment is inappropriate.     ED Results / Procedures / Treatments   Labs (all labs ordered are listed, but only abnormal results are displayed) Labs Reviewed  COMPREHENSIVE METABOLIC PANEL - Abnormal; Notable for the following components:      Result Value   Potassium 3.4 (*)    Creatinine, Ser 1.26 (*)    AST 14 (*)    GFR, Estimated 60 (*)    All other components within normal limits  CBC WITH DIFFERENTIAL/PLATELET - Abnormal; Notable for the following components:   RBC 4.03 (*)    All other components within normal limits  URINALYSIS, ROUTINE W REFLEX MICROSCOPIC - Abnormal; Notable for the following components:   Color, Urine STRAW (*)    Specific Gravity, Urine 1.001 (*)    Leukocytes,Ua TRACE (*)    All other components within normal limits  RESPIRATORY PANEL BY RT PCR (FLU A&B, COVID)  ETHANOL  RAPID URINE DRUG SCREEN, HOSP PERFORMED  LITHIUM LEVEL    EKG EKG Interpretation  Date/Time:  Friday June 03 2020 16:50:29 EDT Ventricular Rate:  74 PR Interval:  146 QRS Duration: 88 QT Interval:  418 QTC Calculation: 463 R Axis:   -4 Text Interpretation: Normal sinus rhythm with sinus arrhythmia Normal ECG Confirmed by Pricilla Loveless [phone]) on 06/03/2020 5:10:13 PM   Radiology No results found.  Procedures Procedures (including critical care time)  Medications Ordered in ED Medications  nicotine (NICODERM CQ - dosed in mg/24 hours) patch 21 mg (21 mg Transdermal Patch Applied 06/03/20 1359)  acetaminophen (TYLENOL) tablet 650 mg (has no administration in time range)  ondansetron (ZOFRAN) tablet 4 mg (has no administration in time range)  amLODipine (NORVASC) tablet 5 mg (has no administration in time range)  cholecalciferol (VITAMIN D3) tablet 2,000 Units (has no administration in time range)  clonazePAM (KLONOPIN) tablet 1 mg (has  no administration in time range)  finasteride (PROSCAR) tablet 5 mg (has no administration in time range)  FLUoxetine (PROZAC) capsule 20 mg (has no administration in time range)  fluticasone (FLONASE) 50 MCG/ACT nasal spray 2 spray (  has no administration in time range)  hydrALAZINE (APRESOLINE) tablet 50 mg (has no administration in time range)  isosorbide mononitrate (IMDUR) 24 hr tablet 30 mg (has no administration in time range)  lithium carbonate (ESKALITH) CR tablet 450 mg (has no administration in time range)  mirtazapine (REMERON) tablet 30 mg (has no administration in time range)  risperiDONE (RISPERDAL M-TABS) disintegrating tablet 1 mg (has no administration in time range)  tamsulosin (FLOMAX) capsule 0.4 mg (has no administration in time range)  tiotropium (SPIRIVA) inhalation capsule ([facility] use ONLY) 18 mcg (has no administration in time range)  traZODone (DESYREL) tablet 50 mg (has no administration in time range)  vitamin B-12 (CYANOCOBALAMIN) tablet 1,000 mcg (has no administration in time range)    ED Course  I have reviewed the triage vital signs and the nursing notes.  Pertinent labs & imaging results that were available during my care of the patient were reviewed by me and considered in my medical decision making (see chart for details).    MDM Rules/Calculators/A&P                          Patient with active suicidal ideation without gesture who is here voluntarily.  He has been cleared medically for TTS evaluation at this time.  Patient does have a significant psych history and I feel he does need evaluation by TTS, patient is cooperative and here voluntarily at this time.  If he decides to leave, we may need to consider involuntary commitment until TTS has had a chance to evaluate him.   Final Clinical Impression(s) / ED Diagnoses Final diagnoses:  Suicidal ideation    Rx / DC Orders ED Discharge Orders    None       Victoriano Laindol, Ailed Defibaugh, PA-C 06/03/20 1810     Long, Arlyss RepressJoshua G, MD 06/06/20 [phone]

## 2020-06-03 NOTE — ED Triage Notes (Signed)
Pt states he has not been able to sleep or eat and states he has so many problems that he feels like he wants to hurt himself; pt states he has thought about jumping off the banister to kill himself

## 2020-06-03 NOTE — ED Notes (Addendum)
Pt. Requested to have their toenails cut. Informed pt that we do not do that here in the ED. Pt. Also requested to speak with a psychiatrist about "My feelings and thoughts about touching men and women inappropriately." Pt. States they have " many thoughts that will get me in trouble." Pt. Also states that they have had thoughts of harming themselves. No direct plan. Just fleeting thoughts of jumping out the window or off a railing. Pt. Also states that in the past they have attempted suicide by "drinking to much coffee".

## 2020-06-04 ENCOUNTER — Emergency Department (HOSPITAL_COMMUNITY)
Admission: EM | Admit: 2020-06-04 | Discharge: 2020-06-14 | Disposition: A | Discharge status: Home / Self Care | Payer: Medicaid Other | Attending: Emergency Medicine | Admitting: Emergency Medicine

## 2020-06-04 ENCOUNTER — Encounter (HOSPITAL_COMMUNITY): Payer: Self-pay

## 2020-06-04 ENCOUNTER — Other Ambulatory Visit: Payer: Self-pay

## 2020-06-04 DIAGNOSIS — Z20822 Contact with and (suspected) exposure to covid-19: Secondary | ICD-10-CM | POA: Insufficient documentation

## 2020-06-04 DIAGNOSIS — R45851 Suicidal ideations: Secondary | ICD-10-CM | POA: Insufficient documentation

## 2020-06-04 DIAGNOSIS — F209 Schizophrenia, unspecified: Secondary | ICD-10-CM | POA: Diagnosis present

## 2020-06-04 DIAGNOSIS — F17200 Nicotine dependence, unspecified, uncomplicated: Secondary | ICD-10-CM | POA: Diagnosis not present

## 2020-06-04 DIAGNOSIS — F329 Major depressive disorder, single episode, unspecified: Secondary | ICD-10-CM | POA: Diagnosis not present

## 2020-06-04 DIAGNOSIS — Z79899 Other long term (current) drug therapy: Secondary | ICD-10-CM | POA: Insufficient documentation

## 2020-06-04 DIAGNOSIS — I10 Essential (primary) hypertension: Secondary | ICD-10-CM | POA: Insufficient documentation

## 2020-06-04 DIAGNOSIS — F99 Mental disorder, not otherwise specified: Secondary | ICD-10-CM

## 2020-06-04 LAB — RAPID URINE DRUG SCREEN, HOSP PERFORMED
Amphetamines: NOT DETECTED
Barbiturates: NOT DETECTED
Benzodiazepines: NOT DETECTED
Cocaine: NOT DETECTED
Opiates: NOT DETECTED
Tetrahydrocannabinol: NOT DETECTED

## 2020-06-04 LAB — COMPREHENSIVE METABOLIC PANEL
ALT: 10 U/L (ref 0–44)
AST: 11 U/L — ABNORMAL LOW (ref 15–41)
Albumin: 3.3 g/dL — ABNORMAL LOW (ref 3.5–5.0)
Alkaline Phosphatase: 50 U/L (ref 38–126)
Anion gap: 8 (ref 5–15)
BUN: 18 mg/dL (ref 8–23)
CO2: 27 mmol/L (ref 22–32)
Calcium: 9.1 mg/dL (ref 8.9–10.3)
Chloride: 104 mmol/L (ref 98–111)
Creatinine, Ser: 1.37 mg/dL — ABNORMAL HIGH (ref 0.61–1.24)
GFR, Estimated: 54 mL/min — ABNORMAL LOW (ref >60)
Glucose, Bld: 103 mg/dL — ABNORMAL HIGH (ref 70–99)
Potassium: 3.3 mmol/L — ABNORMAL LOW (ref 3.5–5.1)
Sodium: 139 mmol/L (ref 135–145)
Total Bilirubin: 0.7 mg/dL (ref 0.3–1.2)
Total Protein: 6.2 g/dL — ABNORMAL LOW (ref 6.5–8.1)

## 2020-06-04 LAB — CBC WITH DIFFERENTIAL/PLATELET
Abs Immature Granulocytes: 0.02 10*3/uL (ref 0.00–0.07)
Basophils Absolute: 0.1 10*3/uL (ref 0.0–0.1)
Basophils Relative: 1 %
Eosinophils Absolute: 0.3 10*3/uL (ref 0.0–0.5)
Eosinophils Relative: 3 %
HCT: 38.7 % — ABNORMAL LOW (ref 39.0–52.0)
Hemoglobin: 12.7 g/dL — ABNORMAL LOW (ref 13.0–17.0)
Immature Granulocytes: 0 %
Lymphocytes Relative: 18 %
Lymphs Abs: 1.7 10*3/uL (ref 0.7–4.0)
MCH: 32.8 pg (ref 26.0–34.0)
MCHC: 32.8 g/dL (ref 30.0–36.0)
MCV: 100 fL (ref 80.0–100.0)
Monocytes Absolute: 0.8 10*3/uL (ref 0.1–1.0)
Monocytes Relative: 9 %
Neutro Abs: 6.4 10*3/uL (ref 1.7–7.7)
Neutrophils Relative %: 69 %
Platelets: 243 10*3/uL (ref 150–400)
RBC: 3.87 MIL/uL — ABNORMAL LOW (ref 4.22–5.81)
RDW: 13.5 % (ref 11.5–15.5)
WBC: 9.2 10*3/uL (ref 4.0–10.5)
nRBC: 0 % (ref 0.0–0.2)

## 2020-06-04 LAB — ETHANOL: Alcohol, Ethyl (B): <10 mg/dL (ref <10)

## 2020-06-04 NOTE — ED Notes (Signed)
Attempted to call [facility] for report/update for discharge.

## 2020-06-04 NOTE — Discharge Instructions (Signed)
Continue your current medications. Return as needed for recurrent symptoms

## 2020-06-04 NOTE — ED Notes (Signed)
Sister is POA Marine scientist [phone] for updated plan of care.

## 2020-06-04 NOTE — ED Provider Notes (Addendum)
Emergency Medicine Observation Re-evaluation Note  Ray Jackson is a 64 y.o. male, seen on rounds today.  Pt initially presented to the ED for complaints of Medical Clearance Currently, the patient is awaiting reassessment by psychiatry.  Physical Exam  BP (!) 185/97 (BP Location: Right Arm)   Pulse 79   Temp 98.7 F (37.1 C) (Oral)   Resp 17   Ht 1.829 m (6')   Wt 61.2 kg   SpO2 95%   BMI 18.31 kg/m  Physical Exam General: calm, resting Cardiac: regular rate Lungs: breathing easily, no retraction, no stridor Psych: no agitation, cooperative,   ED Course / MDM  EKG:EKG Interpretation  Date/Time:  Friday June 03 2020 16:50:29 EDT Ventricular Rate:  74 PR Interval:  146 QRS Duration: 88 QT Interval:  418 QTC Calculation: 463 R Axis:   -4 Text Interpretation: Normal sinus rhythm with sinus arrhythmia Normal ECG Confirmed by Pricilla Loveless [phone]) on 06/03/2020 5:10:13 PM    I have reviewed the labs performed to date as well as medications administered while in observation.  Recent changes in the last 24 hours include no acute changes overnight.  Pt had initial assessment.  Plan was to have psychiatry re evaluate this am.  Plan  Current plan is for reassessment. Patient is not under full IVC at this time.   Linwood Dibbles, MD 06/04/20 0735   11:02 AM patient was reassessed by psychiatry.  Patient is no longer feeling suicidal.  He feels well and is ready for discharge back to his group home.  I did speak with the patient's sister and he is stable for discharge.  We will contact the group home to arrange transport.   Linwood Dibbles, MD 06/04/20 1102
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
## 2020-06-04 NOTE — ED Notes (Signed)
Pt changed into appropriate hospital attire, belongings placed in bag including clothing & shoes, locked in locker, pt given pillow, two warm blankets, wanded by security, resting calmly on stretcher at this time.

## 2020-06-04 NOTE — Progress Notes (Signed)
[facility] staff given report- stated will be here in an hour.
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
## 2020-06-04 NOTE — ED Notes (Signed)
PT telesitter moved to bedside and report given. Pt continues to be calm cooperative resting on stretcher with rail up x 2 and door open for direct observation. Pt denies needs at this time.

## 2020-06-05 ENCOUNTER — Encounter (HOSPITAL_COMMUNITY): Payer: Self-pay | Admitting: Emergency Medicine

## 2020-06-05 LAB — RESPIRATORY PANEL BY RT PCR (FLU A&B, COVID)
Influenza A by PCR: NEGATIVE
Influenza B by PCR: NEGATIVE
SARS Coronavirus 2 by RT PCR: NEGATIVE

## 2020-06-05 MED ORDER — HYDRALAZINE HCL 25 MG PO TABS: 50.0000 mg | ORAL_TABLET | Freq: Three times a day (TID) | ORAL | Status: DC

## 2020-06-05 MED ORDER — CLONAZEPAM 0.5 MG PO TABS
1.0000 mg | ORAL_TABLET | Freq: Every day | ORAL | Status: DC
  Administered 2020-06-05 – 2020-06-13 (×9): 1 mg via ORAL
  Filled 2020-06-05 (×9): qty 2

## 2020-06-05 MED ORDER — RISPERIDONE 1 MG PO TBDP
1.0000 mg | ORAL_TABLET | Freq: Every day | ORAL | Status: DC
  Administered 2020-06-05 – 2020-06-13 (×9): 1 mg via ORAL
  Filled 2020-06-05 (×9): qty 1

## 2020-06-05 MED ORDER — TRAZODONE HCL 50 MG PO TABS
50.0000 mg | ORAL_TABLET | Freq: Every day | ORAL | Status: DC
  Administered 2020-06-05 – 2020-06-13 (×9): 50 mg via ORAL
  Filled 2020-06-05 (×9): qty 1

## 2020-06-05 MED ORDER — HYDRALAZINE HCL 25 MG PO TABS
50.0000 mg | ORAL_TABLET | Freq: Three times a day (TID) | ORAL | Status: DC
  Administered 2020-06-05 – 2020-06-14 (×28): 50 mg via ORAL
  Filled 2020-06-05 (×27): qty 2

## 2020-06-05 MED ORDER — ACETAMINOPHEN 325 MG PO TABS: 650.0000 mg | ORAL_TABLET | Freq: Four times a day (QID) | ORAL | Status: DC | PRN

## 2020-06-05 MED ORDER — HYDROXYZINE HCL 25 MG PO TABS
25.0000 mg | ORAL_TABLET | Freq: Three times a day (TID) | ORAL | Status: DC | PRN
  Administered 2020-06-05 – 2020-06-13 (×5): 25 mg via ORAL
  Filled 2020-06-05 (×5): qty 1

## 2020-06-05 MED ORDER — MIRTAZAPINE 15 MG PO TABS
30.0000 mg | ORAL_TABLET | Freq: Every day | ORAL | Status: DC
  Administered 2020-06-05 – 2020-06-13 (×9): 30 mg via ORAL
  Filled 2020-06-05 (×9): qty 2

## 2020-06-05 MED ORDER — AMLODIPINE BESYLATE 5 MG PO TABS
5.0000 mg | ORAL_TABLET | Freq: Every day | ORAL | Status: DC
  Administered 2020-06-05 – 2020-06-14 (×10): 5 mg via ORAL
  Filled 2020-06-05 (×10): qty 1

## 2020-06-05 NOTE — ED Notes (Signed)
Meal provided 

## 2020-06-05 NOTE — ED Notes (Signed)
Out of bed to bathroom 

## 2020-06-05 NOTE — ED Notes (Signed)
PT being assessed by tele-health.

## 2020-06-05 NOTE — ED Notes (Signed)
PT continues resting on stretcher rails up x 2 curtain open for direct observation. Pt remains calm cooperative at this time. NAD on room air, continue to monitor.
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
## 2020-06-05 NOTE — Progress Notes (Signed)
Patient meets criteria for inpatient treatment per Gillermo Murdoch NP. There are no appropriate beds available due to pt requiring GERO-psych placement. CSW faxed referrals to the following facilities for review:  [facility]  TTS will continue to seek bed placement.   Trula Slade, MSW, LCSW Clinical Social Worker 06/05/2020 9:40 AM

## 2020-06-05 NOTE — ED Notes (Signed)
PT resting on stretcher with rails up x 2, telesitter remains at bedside. PT denies needs and is calm and cooperative at this time. NAD, Pt continues on room air.

## 2020-06-06 LAB — MAGNESIUM: Magnesium: 2.3 mg/dL (ref 1.7–2.4)

## 2020-06-06 MED ORDER — POTASSIUM CHLORIDE CRYS ER 20 MEQ PO TBCR
40.0000 meq | EXTENDED_RELEASE_TABLET | Freq: Once | ORAL | Status: AC
  Administered 2020-06-06: 40 meq via ORAL
  Filled 2020-06-06: qty 2

## 2020-06-06 NOTE — ED Notes (Signed)
Pt up to shower

## 2020-06-07 NOTE — ED Notes (Addendum)
Room cleaned up, pt repositioned in bed; department phone removed from room

## 2020-06-07 NOTE — Progress Notes (Signed)
CSW received a call from [facility], [phone].  Patient is under review for possible bed acceptance at this facility.  CSW to follow up.  Ladoris Gene MSW,LCSWA,LCASA Clinical Social Worker  [facility] Disposition, CSW [phone] (cell)

## 2020-06-07 NOTE — ED Notes (Signed)
Entered pt room; pt lying in bed, talking on department portable phone, advised ph calls are limited and pt needs to wrap up phone call

## 2020-06-07 NOTE — BHH Counselor (Signed)
TTS reassessment: Patient is alert and oriented x 4. "My appetite is better than it was." I'm feeling restless." His speech is slow and he is somewhat disorganized. He continues to endorse SI stating he considers throwing water on an electrical socket to electrocute himself. He states "whenever you are down to nothing you think about suicide."   Patient continues to meet in patient care criteria.

## 2020-06-07 NOTE — Progress Notes (Signed)
Pt continues to meet inpatient criteria. Referral information has been re-faxed to the following hospitals for review:  [facility]  [facility]  [facility]  [facility]  [facility]  [facility]  [facility]  [facility]  [facility]  [facility]  [facility]  [facility]     Disposition will continue to assist with inpatient placement needs.   [name], MSW, LCSW, LCAS Clinical Social Worker II Disposition CSW [phone]

## 2020-06-07 NOTE — ED Notes (Signed)
TTS to re-assess patient at this time.

## 2020-06-07 NOTE — ED Notes (Signed)
Water and cup of ice given

## 2020-06-08 NOTE — ED Notes (Signed)
Pt given ginger ale and clean scrub pants
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
## 2020-06-08 NOTE — BHH Counselor (Signed)
Pt is a 64 year old male who remains at [facility] due to suicidal ideation and other symptoms.  Pt was reassessed this AM.  He reported that he is uncomfortable in his hospital bed, wants to be moved to a more comfortable location.  Pt endorsed continued suicidal ideation, although reported it is not as strong as it has been.  Plan is to drown self --''I know that sounds ridiculous.''    Recommend continued inpatient.

## 2020-06-09 NOTE — Progress Notes (Signed)
[facility] requested more information on the patient for possible bed placement.  CSW faxed requested information.   Ladoris Gene MSW,LCSWA,LCASA Clinical Social Worker  [place] Disposition, CSW [phone] (cell)

## 2020-06-09 NOTE — Progress Notes (Signed)
Pt continues to meet inpatient criteria. Referral information has been re-faxed to the following hospitals for review:  [facility]  [facility]  [facility]  [facility]  [facility]  [facility]    [facility]  [facility]  [facility]  [facility]    [facility]  [facility]  [facility]  [facility]       Disposition will continue to follow.    [name], MSW, LCSW, LCAS Clinical Social Worker II Disposition CSW [phone]

## 2020-06-09 NOTE — BH Assessment (Signed)
Reassessment note: Pt presents lying in bed. He is alert, but states he is feeling agitated "like I always do in the mornings". Pt states he was awoken at 5 and likes that. He says he likes the way he is being cared for- getting assistance with meals and things. Pt states this is the kind of help he needs. Pt reports ongoing SI & not wanting to exist. He denies HI and AVH. Pt wants to speak with his sister by phone but she is not available during the day. Pt was advised to schedule the call with the nurse now for later in the evening since the evening gets busy. Pt reports he has been in phone contact with his father, who is an invalid per pt. Inpt psychiatric tx continues to be recommended.

## 2020-06-09 NOTE — ED Notes (Signed)
Please call Elsie Lincoln at [phone] with disposition and when patient is transferred.

## 2020-06-09 NOTE — ED Notes (Signed)
TTS at this time. 

## 2020-06-10 NOTE — ED Provider Notes (Signed)
Emergency Medicine Observation Re-evaluation Note  Ray Jackson is a 64 y.o. male, seen on rounds today.  Pt initially presented to the ED for complaints of Suicidal Currently, the patient is resting comfortably  Physical Exam  BP 129/68 (BP Location: Left Arm)   Pulse 80   Temp 98 F (36.7 C) (Oral)   Resp 18   Ht 6' (1.829 m)   Wt 61.2 kg   SpO2 94%   BMI 18.31 kg/m  Physical Exam General: No distress Cardiac: Regular rate and rhythm Lungs: Clear to auscultation Psych: Calm and appropriate  ED Course / MDM  EKG:    I have reviewed the labs performed to date as well as medications administered while in observation.  Recent changes in the last 24 hours include no major changes.  Plan  Current plan is for inpatient bed search. Patient is not under full IVC at this time.   Terrilee Files, MD 06/10/20 1750

## 2020-06-10 NOTE — Progress Notes (Signed)
CSW left voice message with Britta Mccreedy at [facility] earlier this morning requesting a return call.   [name], MSW, LCSW, LCAS Clinical Social Worker II Disposition CSW [phone]
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
## 2020-06-11 MED ORDER — POLYETHYLENE GLYCOL 3350 17 G PO PACK
17.0000 g | PACK | Freq: Every day | ORAL | Status: DC | PRN
  Administered 2020-06-11: 17 g via ORAL
  Filled 2020-06-11: qty 1
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
## 2020-06-11 NOTE — Progress Notes (Signed)
CSW left voicemail for [name] intake [phone]) to check status of this referral. [name] LCSW faxed requested information on 10/22 to this facility. CSW left message requesting call back at their earliest convenience in order to ensure that they received all necessary documents and to check bed availability.  [name] S. Alan Ripper, MSW, LCSW Clinical Social Worker 06/11/2020 10:15 AM

## 2020-06-12 NOTE — Progress Notes (Addendum)
CSW left another voicemail for [facility] intake/admission department 06/12/2020 requesting a call back as soon as possible with decision regarding possible admission and to ensure that they received all necessary updated clinicals.  CSW also re-faxed pt out to the following facilities in an effort to secure placement:  [facility]  [facility]  [facility]  [facility]  [facility]  [facility]  [facility]  [facility]  [facility]  [facility]  [facility]  [place]. Alan Ripper, MSW, LCSW Clinical Social Worker 06/12/2020 8:05 AM

## 2020-06-12 NOTE — ED Notes (Signed)
Pt ambulated to BR at this time with steady gait, no assistance needed
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
## 2020-06-12 NOTE — BH Assessment (Signed)
Patient was staffed with Curly Rim, NP and she feels like patient is not ready to be discharged today, but recommends that we continue to seek placement today.  If he is not placed by tomorrow, she states that TTS can negotiate with nursing home when the administration returns to see if they are willing to take him back.

## 2020-06-13 NOTE — Progress Notes (Addendum)
CSW spoke with Kinnie Scales, Chiropodist, with [name] [phone]), pt's ALF and updated him on pt's disposition. He stated that pt can absolutely return when pt is psychiatrically cleared. "We just wanted to make sure his medication were right and that he was safe, he can come back whenever." [facility] NP was notified.   Pt continues to meet inpatient criteria. [facility] is currently reviewing pt for admission.    [name], MSW, LCSW, LCAS Clinical Social Worker II Disposition CSW [phone]
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
## 2020-06-13 NOTE — ED Notes (Signed)
Pt ambulated in hall without difficulty, gait steady.

## 2020-06-13 NOTE — ED Notes (Signed)
Pt ambulated to restroom.  Back to room.

## 2020-06-13 NOTE — ED Notes (Signed)
Assumed care of this pt, pts vitals WDL, telemonitoring continued, no needs expressed at this time

## 2020-06-14 LAB — RESPIRATORY PANEL BY RT PCR (FLU A&B, COVID)
Influenza A by PCR: NEGATIVE
Influenza B by PCR: NEGATIVE
SARS Coronavirus 2 by RT PCR: NEGATIVE
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
## 2020-06-14 NOTE — Progress Notes (Signed)
Pt has been psychiatrically cleared by Denzil Magnuson, NP. CSW notified pt's legal guardian [facility] [phone]) and Roddie Mc, Interior and spatial designer, at [employer] [phone]). Roddie Mc requested that pt receive another Covid test before returning and would like an updated FL-2 (voice message was left with ED social worker about this). She states that he can return to their facility today. Michael @ [facility] ED notified.    [name], MSW, LCSW, LCAS Clinical Social Worker II Disposition CSW [phone]

## 2020-06-14 NOTE — ED Notes (Signed)
Consulted SW. SW aware new FL-2 needed for pt to return to facility. Primary RN aware as well.

## 2020-06-14 NOTE — Discharge Instructions (Addendum)
You were evaluated in the Emergency Department and after careful evaluation, we did not find any emergent condition requiring admission or further testing in the hospital.  Your exam/testing today was overall reassuring.  Please return to the Emergency Department if you experience any worsening of your condition.  Thank you for allowing us to be a part of your care.  

## 2020-06-14 NOTE — ED Provider Notes (Signed)
Emergency Medicine Observation Re-evaluation Note  Ray Jackson is a 64 y.o. male, seen on rounds today.  Pt initially presented to the ED for complaints of Suicidal Currently, the patient is resting comfortably.  He is still pending placement.  Physical Exam  BP (!) 164/86 (BP Location: Right Arm)   Pulse 69   Temp 98.7 F (37.1 C) (Oral)   Resp 17   Ht 6' (1.829 m)   Wt 61.2 kg   SpO2 98%   BMI 18.31 kg/m  Physical Exam General: NAD Cardiac: Regular rate Lungs: No respiratory distress Psych: Stable  ED Course / MDM  EKG:    I have reviewed the labs performed to date as well as medications administered while in observation.    Plan  Current plan is for psychiatric placement Planning for psychiatric reassessment today Patient is not under full IVC at this time.   Terald Sleeper, MD 06/14/20 [phone]

## 2020-06-14 NOTE — Clinical Social Work Note (Signed)
Transition of Care [facility]) - Emergency Department Mini Assessment  Patient Details  Name: Ray Jackson MRN: [id number] Date of Birth: 28-Oct-1955  [facility]) CM/SW Contact:    Ewing Schlein, LCSW Phone Number: 06/14/2020, 1:21 PM  Clinical Narrative: Patient has been psych cleared and can return to his family care home, [facility]. Per [facility], a new FL2 is needed. CSW completed FL2. TOC signing off.  ED Mini Assessment: What brought you to the Emergency Department? : Suicidal thoughts Barriers to Discharge: ED Barriers Resolved Barrier interventions: Patient has been psych cleared Means of departure: Car Interventions which prevented an admission or readmission: BH return to placement after psych assessment  Admission diagnosis:  suicidal thoughts Patient Active Problem List   Diagnosis Date Noted  . Hypokalemia 05/09/2018  . HTN (hypertension) 05/09/2018  . Schizophrenia (HCC) 11/05/2017  . UTI (urinary tract infection) 10/02/2017  . Anemia 10/02/2017   PCP:  Anselm Jungling, NP Pharmacy:   [facility] [address], [place] - [address] [place] [address] Phone: [phone] Fax: [phone]

## 2020-06-14 NOTE — [place] FL2 (Signed)
[place] MEDICAID FL2 LEVEL OF CARE SCREENING TOOL     IDENTIFICATION  Patient Name: Ray Jackson Birthdate: 25-Nov-1955 Sex: male Admission Date (Current Location): 06/04/2020  [place] and [place] Number:  Aaron Edelman [id number] Facility and Address:  [facility],  [address] [place] [address]      Provider Number: [id number]  Attending Physician Name and Address:  Default, Provider, MD  Relative Name and Phone Number:  Elsie Lincoln (sister) Ph: [phone]    Current Level of Care: Hospital Recommended Level of Care: Family Care Home Prior Approval Number:    Date Approved/Denied:   PASRR Number:    Discharge Plan: Domiciliary (Rest home) [facility] [address] [place] [address])    Current Diagnoses: Patient Active Problem List   Diagnosis Date Noted   Hypokalemia 05/09/2018   HTN (hypertension) 05/09/2018   Schizophrenia (HCC) 11/05/2017   UTI (urinary tract infection) 10/02/2017   Anemia 10/02/2017    Orientation RESPIRATION BLADDER Height & Weight     Self, Situation, Place  Normal Continent Weight: 135 lb (61.2 kg) Height:  6' (182.9 cm)  BEHAVIORAL SYMPTOMS/MOOD NEUROLOGICAL BOWEL NUTRITION STATUS      Continent Diet (Regular diet)  AMBULATORY STATUS COMMUNICATION OF NEEDS Skin   Independent Verbally Normal                       Personal Care Assistance Level of Assistance  Bathing, Feeding, Dressing Bathing Assistance: Independent Feeding assistance: Independent Dressing Assistance: Independent     Functional Limitations Info  Sight, Hearing, Speech Sight Info: Impaired Hearing Info: Adequate Speech Info: Adequate    SPECIAL CARE FACTORS FREQUENCY                       Contractures Contractures Info: Not present    Additional Factors Info  Allergies, Psychotropic   Allergies Info: NKA Psychotropic Info: Klonopin (clonazepam); Remeron (mirtazapine); Risperdal (risperidone);  Desyrel (trazadone)         Current Medications (06/14/2020):  This is the current hospital active medication list Current Facility-Administered Medications  Medication Dose Route Frequency Provider Last Rate Last Admin   acetaminophen (TYLENOL) tablet 650 mg  650 mg Oral Q6H PRN Pollyann Savoy, MD       amLODipine (NORVASC) tablet 5 mg  5 mg Oral Daily Susy Frizzle B, MD   5 mg at 06/14/20 1046   clonazePAM (KLONOPIN) tablet 1 mg  1 mg Oral QHS Pollyann Savoy, MD   1 mg at 06/13/20 2137   hydrALAZINE (APRESOLINE) tablet 50 mg  50 mg Oral TID Pollyann Savoy, MD   50 mg at 06/14/20 1046   hydrOXYzine (ATARAX/VISTARIL) tablet 25 mg  25 mg Oral TID PRN Pricilla Loveless, MD   25 mg at 06/13/20 1533   mirtazapine (REMERON) tablet 30 mg  30 mg Oral QHS Pollyann Savoy, MD   30 mg at 06/13/20 2137   polyethylene glycol (MIRALAX / GLYCOLAX) packet 17 g  17 g Oral Daily PRN Eber Hong, MD   17 g at 06/11/20 1533   risperiDONE (RISPERDAL M-TABS) disintegrating tablet 1 mg  1 mg Oral QHS Pollyann Savoy, MD   1 mg at 06/13/20 2137   traZODone (DESYREL) tablet 50 mg  50 mg Oral QHS Pollyann Savoy, MD   50 mg at 06/13/20 2137   Current Outpatient Medications  Medication Sig Dispense Refill   acetaminophen (TYLENOL) 325 MG tablet  Take 2 tablets (650 mg total) by mouth every 6 (six) hours as needed for mild pain (or Fever >/= 101). 12 tablet 0   amLODipine (NORVASC) 5 MG tablet Take 1 tablet (5 mg total) by mouth daily. 30 tablet 2   cetirizine (ZYRTEC) 10 MG tablet Take 10 mg by mouth daily.      Cholecalciferol (VITAMIN D3) 2000 units capsule Take 2,000 Units by mouth daily.     clonazePAM (KLONOPIN) 1 MG tablet Take 1 mg by mouth at bedtime.      finasteride (PROSCAR) 5 MG tablet Take 5 mg by mouth daily.     FLUoxetine (PROZAC) 20 MG tablet Take 20 mg by mouth daily.     fluticasone (FLONASE) 50 MCG/ACT nasal spray Place 2 sprays into both nostrils daily.      hydrALAZINE (APRESOLINE) 50 MG tablet Take 1 tablet (50 mg total) by mouth 3 (three) times daily. 90 tablet 2   isosorbide mononitrate (IMDUR) 30 MG 24 hr tablet Take 1 tablet (30 mg total) by mouth daily. 30 tablet 2   lithium carbonate (ESKALITH) 450 MG CR tablet Take 450 mg by mouth 2 (two) times daily.     mirtazapine (REMERON) 30 MG tablet Take 30 mg by mouth at bedtime.     Paliperidone Palmitate ER (INVEGA TRINZA) 546 MG/1.75ML SUSY Inject 546 mg into the muscle See admin instructions. Every 3 months     Polyethylene Glycol 3350 (GAVILAX PO) Take 17 g by mouth 2 (two) times daily. Mix 1 capful (17 g) in 8 oz of juice/water and drink twice a day.     risperiDONE (RISPERDAL M-TABS) 1 MG disintegrating tablet Take 1 tablet by mouth at bedtime.     senna-docusate (SENOKOT-S) 8.6-50 MG tablet Take 1 tablet by mouth daily.     tamsulosin (FLOMAX) 0.4 MG CAPS capsule Take 0.4 mg by mouth daily.     tiotropium (SPIRIVA) 18 MCG inhalation capsule Place 18 mcg into inhaler and inhale daily.     traZODone (DESYREL) 50 MG tablet Take 1 tablet (50 mg total) by mouth at bedtime. 30 tablet 3   vitamin B-12 (CYANOCOBALAMIN) 1000 MCG tablet Take 1,000 mcg by mouth daily.     lactulose (CHRONULAC) 10 GM/15ML solution Take 30 mLs (20 g total) by mouth daily. (Patient not taking: Reported on 06/03/2020) 473 mL 2   paliperidone (INVEGA SUSTENNA) 156 MG/ML SUSP injection Inject 1 mL (156 mg total) into the muscle every 30 (thirty) days. Next dose due on 4/16 (Patient not taking: Reported on 06/03/2020)     polyethylene glycol (MIRALAX / GLYCOLAX) packet Take 17 g by mouth daily. (Patient not taking: Reported on 06/08/2020)     Potassium Chloride ER 20 MEQ TBCR Take 20 mEq by mouth daily for 3 days. 1 tab daily by mouth 3 tablet 0     Discharge Medications: Please see discharge summary for a list of discharge medications.  Relevant Imaging Results:  Relevant Lab Results:   Additional  Information SSN: [id number]  Ewing Schlein, LCSW

## 2020-08-25 ENCOUNTER — Ambulatory Visit: Payer: Medicaid Other | Admitting: Podiatry

## 2020-09-29 ENCOUNTER — Ambulatory Visit (INDEPENDENT_AMBULATORY_CARE_PROVIDER_SITE_OTHER): Payer: Medicare Other | Admitting: Podiatry

## 2020-09-29 ENCOUNTER — Other Ambulatory Visit: Payer: Self-pay

## 2020-09-29 DIAGNOSIS — F209 Schizophrenia, unspecified: Secondary | ICD-10-CM | POA: Diagnosis not present

## 2020-09-29 DIAGNOSIS — M79675 Pain in left toe(s): Secondary | ICD-10-CM

## 2020-09-29 DIAGNOSIS — M79674 Pain in right toe(s): Secondary | ICD-10-CM

## 2020-09-29 DIAGNOSIS — B351 Tinea unguium: Secondary | ICD-10-CM

## 2020-10-02 ENCOUNTER — Encounter: Payer: Self-pay | Admitting: Podiatry

## 2020-10-02 NOTE — Progress Notes (Signed)
  Subjective:  Patient ID: Ray Jackson, male    DOB: 1956/01/20,  MRN: [id number]  Chief Complaint  Patient presents with  . Nail Problem    Nail trim     65 y.o. male presents with the above complaint. History confirmed with patient. Lives in an assisted living facility, he is here with a caretaker. He is unable to cut his nails due to the thickness and incurvation.   Objective:  Physical Exam: warm, good capillary refill, no trophic changes or ulcerative lesions, normal DP and PT pulses and normal sensory exam. Thickening of the nails with yellow discoloration x10.  Assessment:   1. Onychomycosis   2. Pain due to onychomycosis of toenails of both feet      Plan:  Patient was evaluated and treated and all questions answered.  Discussed the etiology and treatment options for the condition in detail with the patient. Educated patient on the topical and oral treatment options for mycotic nails. Recommended debridement of the nails today. Sharp and mechanical debridement performed of all painful and mycotic nails today. Nails debrided in length and thickness using a nail nipper to level of comfort. Discussed treatment options including appropriate shoe gear. Follow up as needed for painful nails.   Return in about 3 months (around 12/27/2020).

## 2021-01-04 ENCOUNTER — Encounter: Payer: Self-pay | Admitting: Podiatry

## 2021-01-04 ENCOUNTER — Ambulatory Visit (INDEPENDENT_AMBULATORY_CARE_PROVIDER_SITE_OTHER): Payer: Medicare Other | Admitting: Podiatry

## 2021-01-04 ENCOUNTER — Other Ambulatory Visit: Payer: Self-pay

## 2021-01-04 DIAGNOSIS — M79675 Pain in left toe(s): Secondary | ICD-10-CM | POA: Diagnosis not present

## 2021-01-04 DIAGNOSIS — M79674 Pain in right toe(s): Secondary | ICD-10-CM | POA: Diagnosis not present

## 2021-01-04 DIAGNOSIS — B351 Tinea unguium: Secondary | ICD-10-CM | POA: Diagnosis not present

## 2021-01-04 DIAGNOSIS — J449 Chronic obstructive pulmonary disease, unspecified: Secondary | ICD-10-CM | POA: Insufficient documentation

## 2021-01-04 DIAGNOSIS — N1831 Chronic kidney disease, stage 3a: Secondary | ICD-10-CM | POA: Diagnosis not present

## 2021-01-04 NOTE — Progress Notes (Signed)
This patient returns to my office for at risk foot care.  This patient requires this care by a professional since this patient will be at risk due to having CKD.  This patient is unable to cut nails himself since the patient cannot reach his nails.These nails are painful walking and wearing shoes.  This patient presents for at risk foot care today.  General Appearance  Alert, conversant and in no acute stress.  Vascular  Dorsalis pedis and posterior tibial  pulses are palpable  bilaterally.  Capillary return is within normal limits  bilaterally. Temperature is within normal limits  bilaterally.  Neurologic  Senn-Weinstein monofilament wire test within normal limits  bilaterally. Muscle power within normal limits bilaterally.  Nails Thick disfigured discolored nails with subungual debris  from hallux to fifth toes bilaterally. No evidence of bacterial infection or drainage bilaterally.  Orthopedic  No limitations of motion  feet .  No crepitus or effusions noted.  No bony pathology or digital deformities noted.  Skin  normotropic skin with no porokeratosis noted bilaterally.  No signs of infections or ulcers noted.     Onychomycosis  Pain in right toes  Pain in left toes  Consent was obtained for treatment procedures.   Mechanical debridement of nails 1-5  bilaterally performed with a nail nipper.  Filed with dremel without incident.    Return office visit   3 months                  Told patient to return for periodic foot care and evaluation due to potential at risk complications.   Taisha Pennebaker DPM   

## 2021-04-11 ENCOUNTER — Ambulatory Visit: Payer: Medicare Other | Admitting: Podiatry

## 2021-04-18 ENCOUNTER — Ambulatory Visit: Payer: Medicare Other | Admitting: Podiatry

## 2021-04-19 ENCOUNTER — Other Ambulatory Visit: Payer: Self-pay

## 2021-04-19 ENCOUNTER — Encounter: Payer: Self-pay | Admitting: Podiatry

## 2021-04-19 ENCOUNTER — Ambulatory Visit (INDEPENDENT_AMBULATORY_CARE_PROVIDER_SITE_OTHER): Payer: Medicare Other | Admitting: Podiatry

## 2021-04-19 DIAGNOSIS — B351 Tinea unguium: Secondary | ICD-10-CM

## 2021-04-19 DIAGNOSIS — M79675 Pain in left toe(s): Secondary | ICD-10-CM

## 2021-04-19 DIAGNOSIS — M79674 Pain in right toe(s): Secondary | ICD-10-CM | POA: Diagnosis not present

## 2021-04-19 DIAGNOSIS — N1831 Chronic kidney disease, stage 3a: Secondary | ICD-10-CM | POA: Diagnosis not present

## 2021-04-19 NOTE — Progress Notes (Signed)
This patient returns to my office for at risk foot care.  This patient requires this care by a professional since this patient will be at risk due to having CKD.  This patient is unable to cut nails himself since the patient cannot reach his nails.These nails are painful walking and wearing shoes.  This patient presents for at risk foot care today.  General Appearance  Alert, conversant and in no acute stress.  Vascular  Dorsalis pedis and posterior tibial  pulses are palpable  bilaterally.  Capillary return is within normal limits  bilaterally. Temperature is within normal limits  bilaterally.  Neurologic  Senn-Weinstein monofilament wire test within normal limits  bilaterally. Muscle power within normal limits bilaterally.  Nails Thick disfigured discolored nails with subungual debris  from hallux to fifth toes bilaterally. No evidence of bacterial infection or drainage bilaterally.  Orthopedic  No limitations of motion  feet .  No crepitus or effusions noted.  No bony pathology or digital deformities noted.  Skin  normotropic skin with no porokeratosis noted bilaterally.  No signs of infections or ulcers noted.     Onychomycosis  Pain in right toes  Pain in left toes  Consent was obtained for treatment procedures.   Mechanical debridement of nails 1-5  bilaterally performed with a nail nipper.  Filed with dremel without incident.    Return office visit   3 months                  Told patient to return for periodic foot care and evaluation due to potential at risk complications.   Shuree Brossart DPM   

## 2021-07-17 ENCOUNTER — Encounter: Payer: Self-pay | Admitting: Podiatry

## 2021-07-17 ENCOUNTER — Ambulatory Visit (INDEPENDENT_AMBULATORY_CARE_PROVIDER_SITE_OTHER): Payer: Medicare Other | Admitting: Podiatry

## 2021-07-17 ENCOUNTER — Other Ambulatory Visit: Payer: Self-pay

## 2021-07-17 DIAGNOSIS — B351 Tinea unguium: Secondary | ICD-10-CM

## 2021-07-17 DIAGNOSIS — M79674 Pain in right toe(s): Secondary | ICD-10-CM

## 2021-07-17 DIAGNOSIS — N1831 Chronic kidney disease, stage 3a: Secondary | ICD-10-CM | POA: Diagnosis not present

## 2021-07-17 DIAGNOSIS — F209 Schizophrenia, unspecified: Secondary | ICD-10-CM

## 2021-07-17 DIAGNOSIS — M79675 Pain in left toe(s): Secondary | ICD-10-CM | POA: Diagnosis not present

## 2021-07-17 NOTE — Progress Notes (Signed)
This patient returns to my office for at risk foot care.  This patient requires this care by a professional since this patient will be at risk due to having CKD.  This patient is unable to cut nails himself since the patient cannot reach his nails.These nails are painful walking and wearing shoes.  This patient presents for at risk foot care today.  General Appearance  Alert, conversant and in no acute stress.  Vascular  Dorsalis pedis and posterior tibial  pulses are palpable  bilaterally.  Capillary return is within normal limits  bilaterally. Temperature is within normal limits  bilaterally.  Neurologic  Senn-Weinstein monofilament wire test within normal limits  bilaterally. Muscle power within normal limits bilaterally.  Nails Thick disfigured discolored nails with subungual debris  from hallux to fifth toes bilaterally. No evidence of bacterial infection or drainage bilaterally.  Orthopedic  No limitations of motion  feet .  No crepitus or effusions noted.  No bony pathology or digital deformities noted.  Skin  normotropic skin with no porokeratosis noted bilaterally.  No signs of infections or ulcers noted.     Onychomycosis  Pain in right toes  Pain in left toes  Consent was obtained for treatment procedures.   Mechanical debridement of nails 1-5  bilaterally performed with a nail nipper.  Filed with dremel without incident.    Return office visit   3 months                  Told patient to return for periodic foot care and evaluation due to potential at risk complications.   Caelin Rayl DPM   

## 2021-07-19 ENCOUNTER — Ambulatory Visit: Payer: Medicare Other | Admitting: Podiatry

## 2021-09-07 IMAGING — CR DG CHEST 2V
3 series · 3 of 3 positions shown · non-contrast
Comparison: 11/05/2017.

CLINICAL DATA: Chest pain.

EXAM:
CHEST - 2 VIEW

[chest lat]
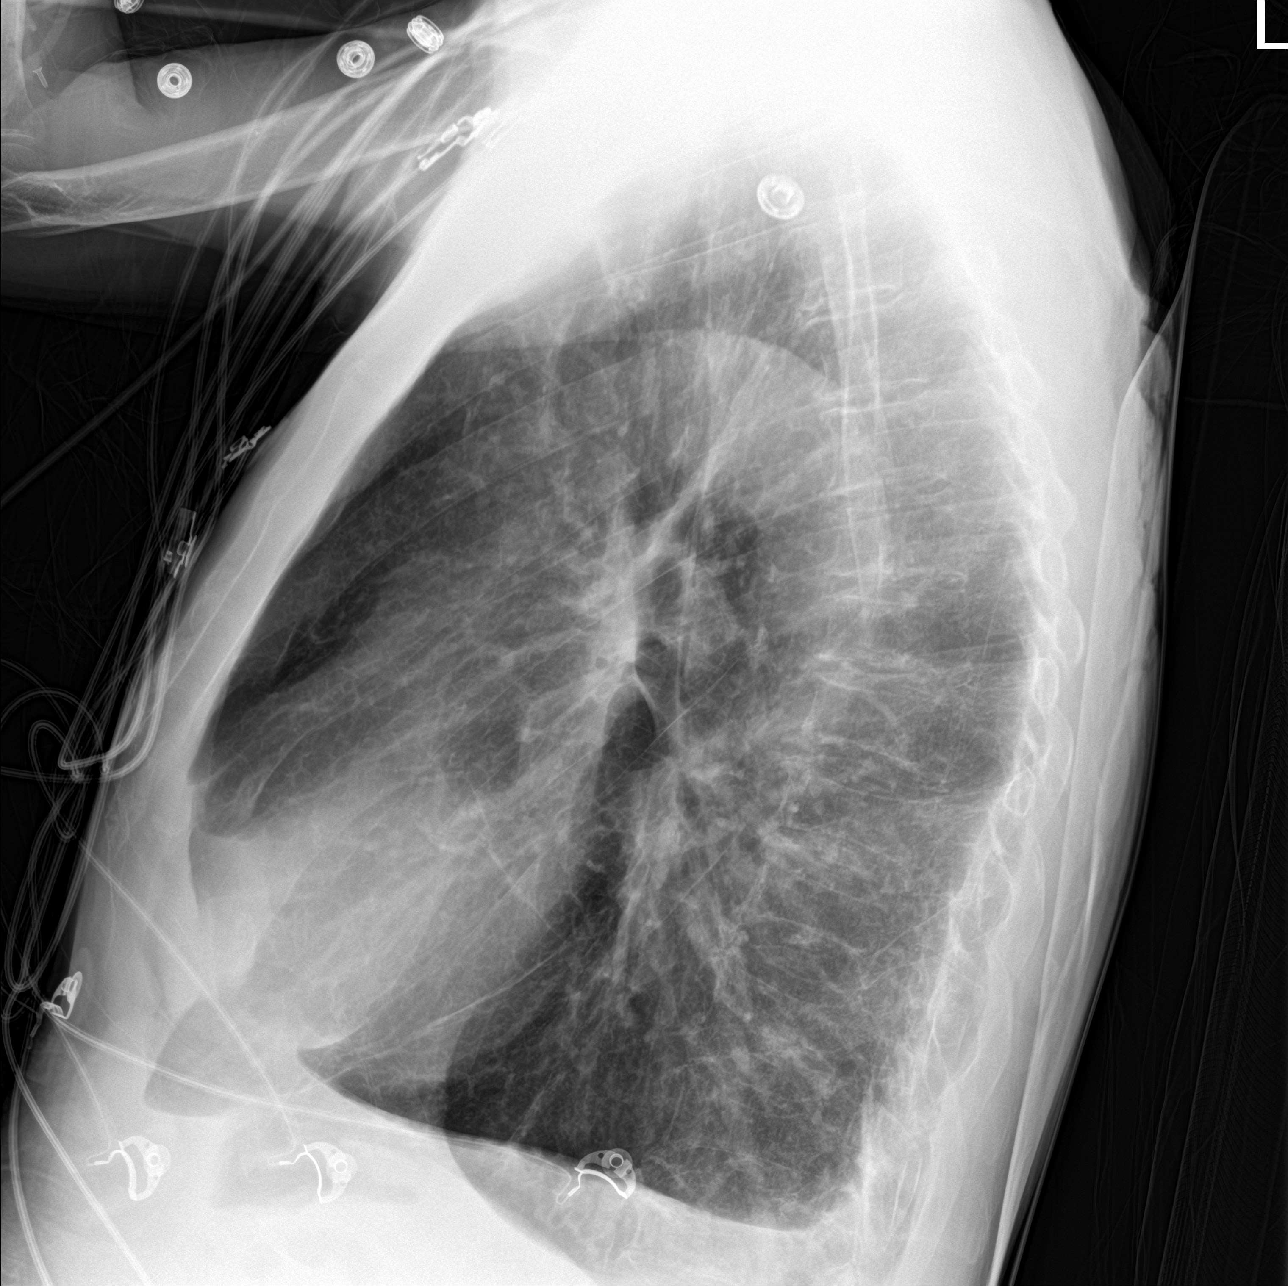

[chest ap (1 of 2)]
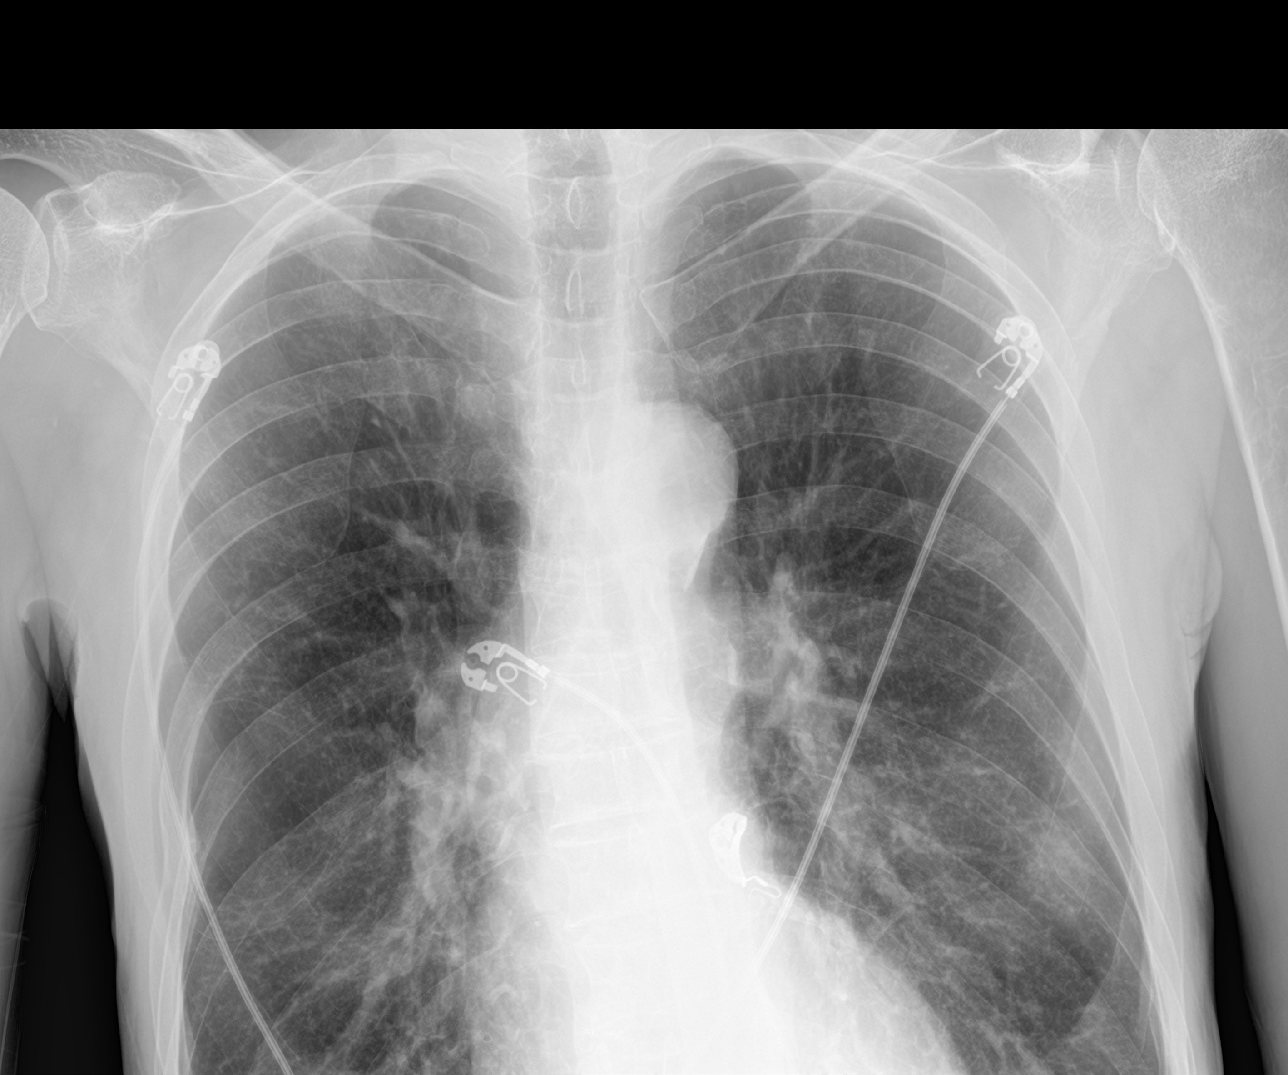

[chest ap (2 of 2)]
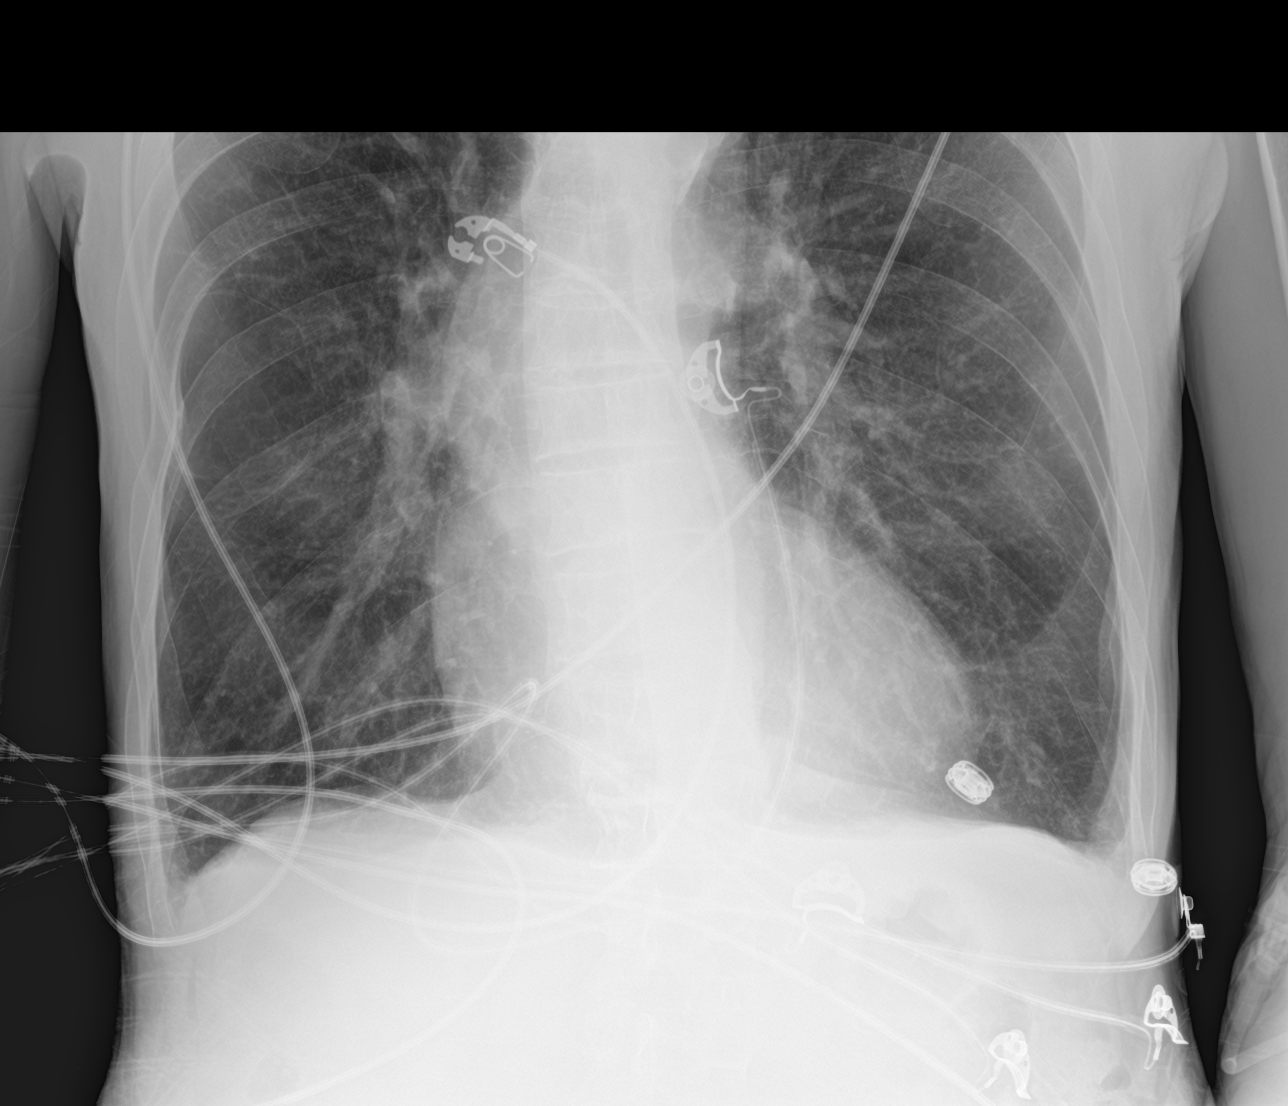

[3 of 3 positions shown; findings below may reference images not displayed]

FINDINGS: Trachea is midline. Heart size stable. Thoracic aorta is calcified.
Lungs are hyperinflated with a bullous lesion in the right midlung
zone. No pleural fluid. Mild thoracic compression deformities,
similar. Osteopenia. Old rib fractures.
IMPRESSION: Hyperinflation without acute finding.

## 2021-10-18 ENCOUNTER — Ambulatory Visit (INDEPENDENT_AMBULATORY_CARE_PROVIDER_SITE_OTHER): Payer: Medicare Other | Admitting: Podiatry

## 2021-10-18 ENCOUNTER — Encounter: Payer: Self-pay | Admitting: Podiatry

## 2021-10-18 ENCOUNTER — Other Ambulatory Visit: Payer: Self-pay

## 2021-10-18 DIAGNOSIS — M79674 Pain in right toe(s): Secondary | ICD-10-CM | POA: Diagnosis not present

## 2021-10-18 DIAGNOSIS — N1831 Chronic kidney disease, stage 3a: Secondary | ICD-10-CM

## 2021-10-18 DIAGNOSIS — B351 Tinea unguium: Secondary | ICD-10-CM

## 2021-10-18 DIAGNOSIS — M79675 Pain in left toe(s): Secondary | ICD-10-CM

## 2021-10-18 NOTE — Progress Notes (Addendum)
This patient returns to my office for at risk foot care.  This patient requires this care by a professional since this patient will be at risk due to having CKD.    This patient is unable to cut nails himself since the patient cannot reach his nails.These nails are painful walking and wearing shoes.  This patient presents for at risk foot care today. ? ?General Appearance  Alert, conversant and in no acute stress. ? ?Vascular  Dorsalis pedis and posterior tibial  pulses are palpable  bilaterally.  Capillary return is within normal limits  bilaterally. Temperature is within normal limits  bilaterally. ? ?Neurologic  Senn-Weinstein monofilament wire test within normal limits  bilaterally. Muscle power within normal limits bilaterally. ? ?Nails Thick disfigured discolored nails with subungual debris  from hallux to fifth toes bilaterally. No evidence of bacterial infection or drainage bilaterally.  Incer nails. ? ?Orthopedic  No limitations of motion  feet .  No crepitus or effusions noted.  No bony pathology or digital deformities noted. ? ?Skin  normotropic skin with no porokeratosis noted bilaterally.  No signs of infections or ulcers noted.    ? ?Onychomycosis  Pain in right toes  Pain in left toes ? ?Consent was obtained for treatment procedures.   Mechanical debridement of nails 1-5  bilaterally performed with a nail nipper.  Filed with dremel without incident.  ? ? ?Return office visit   3 months                   Told patient to return for periodic foot care and evaluation due to potential at risk complications. ? ? ?Helane Gunther DPM  ?

## 2022-01-24 ENCOUNTER — Ambulatory Visit: Payer: Medicare Other | Admitting: Podiatry

## 2022-03-16 ENCOUNTER — Encounter (HOSPITAL_COMMUNITY): Payer: Self-pay

## 2022-03-16 ENCOUNTER — Other Ambulatory Visit: Payer: Self-pay

## 2022-03-16 ENCOUNTER — Emergency Department (HOSPITAL_COMMUNITY)
Admission: EM | Admit: 2022-03-16 | Discharge: 2022-03-16 | Disposition: A | Discharge status: Skilled Nursing Facility | Payer: Medicare Other | Attending: Emergency Medicine | Admitting: Emergency Medicine

## 2022-03-16 DIAGNOSIS — N289 Disorder of kidney and ureter, unspecified: Secondary | ICD-10-CM | POA: Insufficient documentation

## 2022-03-16 DIAGNOSIS — I10 Essential (primary) hypertension: Secondary | ICD-10-CM | POA: Insufficient documentation

## 2022-03-16 DIAGNOSIS — N3090 Cystitis, unspecified without hematuria: Secondary | ICD-10-CM

## 2022-03-16 DIAGNOSIS — N3091 Cystitis, unspecified with hematuria: Secondary | ICD-10-CM | POA: Insufficient documentation

## 2022-03-16 DIAGNOSIS — Z79899 Other long term (current) drug therapy: Secondary | ICD-10-CM | POA: Insufficient documentation

## 2022-03-16 DIAGNOSIS — R319 Hematuria, unspecified: Secondary | ICD-10-CM | POA: Diagnosis present

## 2022-03-16 LAB — CBC
HCT: 36.3 % — ABNORMAL LOW (ref 39.0–52.0)
Hemoglobin: 11.6 g/dL — ABNORMAL LOW (ref 13.0–17.0)
MCH: 32.3 pg (ref 26.0–34.0)
MCHC: 32 g/dL (ref 30.0–36.0)
MCV: 101.1 fL — ABNORMAL HIGH (ref 80.0–100.0)
Platelets: 245 10*3/uL (ref 150–400)
RBC: 3.59 MIL/uL — ABNORMAL LOW (ref 4.22–5.81)
RDW: 14.3 % (ref 11.5–15.5)
WBC: 10.7 10*3/uL — ABNORMAL HIGH (ref 4.0–10.5)
nRBC: 0 % (ref 0.0–0.2)

## 2022-03-16 LAB — URINALYSIS, ROUTINE W REFLEX MICROSCOPIC
Bacteria, UA: NONE SEEN
Bilirubin Urine: NEGATIVE
Glucose, UA: NEGATIVE mg/dL
Ketones, ur: NEGATIVE mg/dL
Nitrite: NEGATIVE
Protein, ur: 100 mg/dL — AB
RBC / HPF: >50 RBC/hpf — ABNORMAL HIGH (ref 0–5)
Specific Gravity, Urine: 1.006 (ref 1.005–1.030)
WBC, UA: >50 WBC/hpf — ABNORMAL HIGH (ref 0–5)
pH: 6 (ref 5.0–8.0)

## 2022-03-16 LAB — BASIC METABOLIC PANEL
Anion gap: 5 (ref 5–15)
BUN: 19 mg/dL (ref 8–23)
CO2: 27 mmol/L (ref 22–32)
Calcium: 9.1 mg/dL (ref 8.9–10.3)
Chloride: 108 mmol/L (ref 98–111)
Creatinine, Ser: 1.83 mg/dL — ABNORMAL HIGH (ref 0.61–1.24)
GFR, Estimated: 40 mL/min — ABNORMAL LOW (ref >60)
Glucose, Bld: 118 mg/dL — ABNORMAL HIGH (ref 70–99)
Potassium: 2.8 mmol/L — ABNORMAL LOW (ref 3.5–5.1)
Sodium: 140 mmol/L (ref 135–145)

## 2022-03-16 LAB — LITHIUM LEVEL: Lithium Lvl: 1.07 mmol/L (ref 0.60–1.20)

## 2022-03-16 MED ORDER — POTASSIUM CHLORIDE CRYS ER 20 MEQ PO TBCR: 20.0000 meq | EXTENDED_RELEASE_TABLET | Freq: Every day | ORAL | 0 refills | Status: DC

## 2022-03-16 MED ORDER — MAGNESIUM SULFATE 2 GM/50ML IV SOLN
2.0000 g | Freq: Once | INTRAVENOUS | Status: AC
Start: 2022-03-16 — End: 2022-03-16
  Administered 2022-03-16: 2 g via INTRAVENOUS
  Filled 2022-03-16: qty 50

## 2022-03-16 MED ORDER — POTASSIUM CHLORIDE 10 MEQ/100ML IV SOLN
10.0000 meq | Freq: Once | INTRAVENOUS | Status: AC
  Administered 2022-03-16: 10 meq via INTRAVENOUS
  Filled 2022-03-16: qty 100

## 2022-03-16 MED ORDER — SODIUM CHLORIDE 0.9 % IV SOLN
1.0000 g | Freq: Once | INTRAVENOUS | Status: AC
  Administered 2022-03-16: 1 g via INTRAVENOUS
  Filled 2022-03-16: qty 10

## 2022-03-16 MED ORDER — CEPHALEXIN 500 MG PO CAPS: 500.0000 mg | ORAL_CAPSULE | Freq: Three times a day (TID) | ORAL | 0 refills | Status: DC

## 2022-03-16 MED ORDER — CEPHALEXIN 500 MG PO CAPS: 500.0000 mg | ORAL_CAPSULE | Freq: Three times a day (TID) | ORAL | 0 refills | Status: AC

## 2022-03-16 MED ORDER — SODIUM CHLORIDE 0.9 % IV BOLUS
1000.0000 mL | Freq: Once | INTRAVENOUS | Status: AC
  Administered 2022-03-16: 1000 mL via INTRAVENOUS

## 2022-03-16 NOTE — Discharge Instructions (Addendum)
Your testing today shows that you have some dehydration and your kidneys are little bit dry.  We have given you IV fluids and IV antibiotics for what appears to be a urinary tract infection.  I would like for you to take the following medications to continue to treat this at home  Your testing also shows that you have a low potassium but we have given you some potassium through the IV and you will need to continue to take potassium once a day for 5 days.  I have prescribed this for you  Cephalexin, 500 mg capsules.  This is an antibiotic that is used to treat multiple different infections, please take 1 capsule by mouth 3 times daily for 7 days.  This may cause diarrhea in some people, if you are developing any signs of an allergic reaction please stop the medication immediately and call your doctor or return to the emergency department for worsening symptoms  Drink plenty of clear liquids  Have your family doctor recheck your blood work within the week to make sure that your kidney function is getting better

## 2022-03-16 NOTE — ED Triage Notes (Signed)
Patient reports he started to have blood in his urine 5 days ago

## 2022-03-16 NOTE — ED Notes (Signed)
Left a message with sister to contact hospital for discharge planning and updates.

## 2022-03-16 NOTE — ED Notes (Signed)
Patient discharged to [facility], Patient states that he feels a lot better.

## 2022-03-16 NOTE — ED Provider Notes (Signed)
[facility] EMERGENCY DEPARTMENT Provider Note   CSN: [id number] Arrival date & time: 03/16/22  1023     History  Chief Complaint  Patient presents with   Hematuria    Ray Jackson is a 66 y.o. male.   Hematuria   This patient is a 66 year old male, he currently takes amlodipine for blood pressure, he is on Prozac, Proscar, hydralazine, Imdur, lactulose, lithium, olanzapine and trazodone.  He presents to the hospital with a complaint of hematuria.  States this has been going on for about 5 days, it is painless and not associated with any dysuria fever chills nausea or vomiting.  He states he is incontinent of urine at night, this is fairly common for him.  He has no other complaints at this time    Home Medications Prior to Admission medications   Medication Sig Start Date End Date Taking? Authorizing Provider  cephALEXin (KEFLEX) 500 MG capsule Take 1 capsule (500 mg total) by mouth 3 (three) times daily for 7 days. 03/16/22 03/23/22 Yes Eber Hong, MD  potassium chloride SA (KLOR-CON M) 20 MEQ tablet Take 1 tablet (20 mEq total) by mouth daily for 5 days. 03/16/22 03/21/22 Yes Eber Hong, MD  acetaminophen (TYLENOL) 325 MG tablet Take 2 tablets (650 mg total) by mouth every 6 (six) hours as needed for mild pain (or Fever >/= 101). 05/10/18   Emokpae, Courage, MD  amLODipine (NORVASC) 5 MG tablet Take 1 tablet (5 mg total) by mouth daily. 05/11/18   Shon Hale, MD  cetirizine (ZYRTEC) 10 MG tablet Take 10 mg by mouth daily.     [provider]  Cholecalciferol (VITAMIN D3) 2000 units capsule Take 2,000 Units by mouth daily.    [provider]  clonazePAM (KLONOPIN) 1 MG tablet Take 1 mg by mouth at bedtime.     [provider]  diclofenac (VOLTAREN) 50 MG EC tablet Take 50 mg by mouth daily as needed. 10/26/20   [provider]  finasteride (PROSCAR) 5 MG tablet Take 5 mg by mouth daily.    [provider]  FLUoxetine (PROZAC) 20 MG  capsule Take 20 mg by mouth daily. 12/19/20   [provider]  FLUoxetine (PROZAC) 20 MG tablet Take 20 mg by mouth daily.    [provider]  fluticasone (FLONASE) 50 MCG/ACT nasal spray Place 2 sprays into both nostrils daily.    [provider]  hydrALAZINE (APRESOLINE) 50 MG tablet Take 1 tablet (50 mg total) by mouth 3 (three) times daily. 05/10/18   Shon Hale, MD  isosorbide mononitrate (IMDUR) 30 MG 24 hr tablet Take 1 tablet (30 mg total) by mouth daily. 05/11/18   Shon Hale, MD  lactulose (CHRONULAC) 10 GM/15ML solution Take 30 mLs (20 g total) by mouth daily. Patient not taking: Reported on 06/03/2020 05/10/18   Shon Hale, MD  lithium carbonate (ESKALITH) 450 MG CR tablet Take 450 mg by mouth 2 (two) times daily.    [provider]  mirtazapine (REMERON) 30 MG tablet Take 30 mg by mouth at bedtime.    [provider]  OLANZapine (ZYPREXA) 5 MG tablet Take 5 mg by mouth at bedtime. 12/19/20   [provider]  paliperidone (INVEGA SUSTENNA) 156 MG/ML SUSP injection Inject 1 mL (156 mg total) into the muscle every 30 (thirty) days. Next dose due on 4/16 Patient not taking: Reported on 06/03/2020 11/14/17   Haskell Riling, MD  Paliperidone Palmitate ER (INVEGA TRINZA) 546 MG/1.75ML SUSY Inject  546 mg into the muscle See admin instructions. Every 3 months    [provider]  polyethylene glycol (MIRALAX / GLYCOLAX) packet Take 17 g by mouth daily. Patient not taking: Reported on 06/08/2020    [provider]  Polyethylene Glycol 3350 (GAVILAX PO) Take 17 g by mouth 2 (two) times daily. Mix 1 capful (17 g) in 8 oz of juice/water and drink twice a day.    [provider]  Potassium Chloride ER 20 MEQ TBCR Take 20 mEq by mouth daily for 3 days. 1 tab daily by mouth 05/10/18 05/13/18  Shon Hale, MD  risperiDONE (RISPERDAL M-TABS) 1 MG disintegrating tablet Take 1 tablet by mouth at bedtime. 09/12/17    [provider]  senna-docusate (SENOKOT-S) 8.6-50 MG tablet Take 1 tablet by mouth daily.    [provider]  tamsulosin (FLOMAX) 0.4 MG CAPS capsule Take 0.4 mg by mouth daily.    [provider]  tiotropium (SPIRIVA) 18 MCG inhalation capsule Place 18 mcg into inhaler and inhale daily.    [provider]  traZODone (DESYREL) 50 MG tablet Take 1 tablet (50 mg total) by mouth at bedtime. 05/10/18   Shon Hale, MD  vitamin B-12 (CYANOCOBALAMIN) 1000 MCG tablet Take 1,000 mcg by mouth daily.    [provider]      Allergies    Patient has no known allergies.    Review of Systems   Review of Systems  Genitourinary:  Positive for hematuria.  All other systems reviewed and are negative.   Physical Exam Updated Vital Signs BP 120/74 (BP Location: Right Arm)   Pulse 66   Temp 97.8 F (36.6 C) (Oral)   Resp 18   Ht 1.829 m (6')   Wt 54.4 kg   SpO2 95%   BMI 16.27 kg/m  Physical Exam Vitals and nursing note reviewed.  Constitutional:      General: He is not in acute distress.    Appearance: He is well-developed.  HENT:     Head: Normocephalic and atraumatic.     Mouth/Throat:     Pharynx: No oropharyngeal exudate.  Eyes:     General: No scleral icterus.       Right eye: No discharge.        Left eye: No discharge.     Conjunctiva/sclera: Conjunctivae normal.     Pupils: Pupils are equal, round, and reactive to light.  Neck:     Thyroid: No thyromegaly.     Vascular: No JVD.  Cardiovascular:     Rate and Rhythm: Normal rate and regular rhythm.     Heart sounds: Normal heart sounds. No murmur heard.    No friction rub. No gallop.  Pulmonary:     Effort: Pulmonary effort is normal. No respiratory distress.     Breath sounds: Normal breath sounds. No wheezing or rales.  Abdominal:     General: Bowel sounds are normal. There is no distension.     Palpations: Abdomen is soft. There is no mass.     Tenderness: There is no  abdominal tenderness.  Genitourinary:    Comments: The patient has a normal-appearing circumcised penis, normal-appearing scrotum and testicles, no inguinal masses or tenderness, no blood at the urethral meatus Musculoskeletal:        General: No tenderness. Normal range of motion.     Cervical back: Normal range of motion and neck supple.     Right lower leg: No edema.  Left lower leg: No edema.  Lymphadenopathy:     Cervical: No cervical adenopathy.  Skin:    General: Skin is warm and dry.     Findings: No erythema or rash.  Neurological:     Mental Status: He is alert.     Coordination: Coordination normal.  Psychiatric:        Behavior: Behavior normal.     ED Results / Procedures / Treatments   Labs (all labs ordered are listed, but only abnormal results are displayed) Labs Reviewed  URINALYSIS, ROUTINE W REFLEX MICROSCOPIC - Abnormal; Notable for the following components:      Result Value   Color, Urine AMBER (*)    APPearance HAZY (*)    Hgb urine dipstick LARGE (*)    Protein, ur 100 (*)    Leukocytes,Ua LARGE (*)    RBC / HPF >50 (*)    WBC, UA >50 (*)    Non Squamous Epithelial 0-5 (*)    All other components within normal limits  CBC - Abnormal; Notable for the following components:   WBC 10.7 (*)    RBC 3.59 (*)    Hemoglobin 11.6 (*)    HCT 36.3 (*)    MCV 101.1 (*)    All other components within normal limits  BASIC METABOLIC PANEL - Abnormal; Notable for the following components:   Potassium 2.8 (*)    Glucose, Bld 118 (*)    Creatinine, Ser 1.83 (*)    GFR, Estimated 40 (*)    All other components within normal limits  URINE CULTURE  LITHIUM LEVEL    EKG None  Radiology No results found.  Procedures Procedures    Medications Ordered in ED Medications  sodium chloride 0.9 % bolus 1,000 mL (1,000 mLs Intravenous New Bag/Given 03/16/22 1312)  magnesium sulfate IVPB 2 g 50 mL (2 g Intravenous New Bag/Given 03/16/22 1316)  potassium  chloride 10 mEq in 100 mL IVPB (10 mEq Intravenous New Bag/Given 03/16/22 1314)  cefTRIAXone (ROCEPHIN) 1 g in sodium chloride 0.9 % 100 mL IVPB (1 g Intravenous New Bag/Given 03/16/22 1333)    ED Course/ Medical Decision Making/ A&P                           Medical Decision Making Amount and/or Complexity of Data Reviewed Labs: ordered.  Risk Prescription drug management.   This patient is in no distress, complains of hematuria, exam is unremarkable, vital signs are unremarkable, check urinalysis with a culture to start.  The patient is also on lithium we will check basic labs including renal function and a lithium level  Labs: I personally viewed and interpreted labs which show that the patient is hypokalemic, he will need some additional potassium at home even though he was given magnesium and potassium here.  Urinalysis also reveals many white blood cells and red blood cells which could be consistent with a urinary tract infection and given that he has some hematuria and some renal insufficiency we will treat this with IV fluids and antibiotics.  ED course: Patient improved significantly, feels good, vitals normal, no vomiting, no fever, no tachycardia.  Medication management: Patient to be sent home with both potassium supplements for 1 week as well as cephalexin for 1 week  Patient is agreeable to discharge well-appearing and stable        Final Clinical Impression(s) / ED Diagnoses Final diagnoses:  Renal insufficiency  Cystitis  Rx / DC Orders ED Discharge Orders          Ordered    cephALEXin (KEFLEX) 500 MG capsule  3 times daily        03/16/22 1421    potassium chloride SA (KLOR-CON M) 20 MEQ tablet  Daily        03/16/22 1423              Eber Hong, MD 03/16/22 1424

## 2022-03-16 NOTE — ED Notes (Signed)
Pt provided with ice water

## 2022-03-16 NOTE — ED Notes (Signed)
Patient sister Advice worker) notified pt. transported back to [employer]

## 2022-03-17 LAB — URINE CULTURE: Culture: <10000 — AB

## 2022-04-04 ENCOUNTER — Ambulatory Visit: Payer: Medicare Other | Admitting: Podiatry

## 2022-04-04 ENCOUNTER — Ambulatory Visit (INDEPENDENT_AMBULATORY_CARE_PROVIDER_SITE_OTHER): Payer: Medicare Other | Admitting: Podiatry

## 2022-04-04 ENCOUNTER — Encounter: Payer: Self-pay | Admitting: Podiatry

## 2022-04-04 DIAGNOSIS — M79674 Pain in right toe(s): Secondary | ICD-10-CM

## 2022-04-04 DIAGNOSIS — N1831 Chronic kidney disease, stage 3a: Secondary | ICD-10-CM

## 2022-04-04 DIAGNOSIS — M79675 Pain in left toe(s): Secondary | ICD-10-CM

## 2022-04-04 DIAGNOSIS — F209 Schizophrenia, unspecified: Secondary | ICD-10-CM

## 2022-04-04 DIAGNOSIS — B351 Tinea unguium: Secondary | ICD-10-CM

## 2022-04-04 NOTE — Progress Notes (Signed)
This patient returns to my office for at risk foot care.  This patient requires this care by a professional since this patient will be at risk due to having CKD.    This patient is unable to cut nails himself since the patient cannot reach his nails.These nails are painful walking and wearing shoes.  This patient presents for at risk foot care today.  General Appearance  Alert, conversant and in no acute stress.  Vascular  Dorsalis pedis and posterior tibial  pulses are palpable  bilaterally.  Capillary return is within normal limits  bilaterally. Temperature is within normal limits  bilaterally.  Neurologic  Senn-Weinstein monofilament wire test within normal limits  bilaterally. Muscle power within normal limits bilaterally.  Nails Thick disfigured discolored nails with subungual debris  from hallux to third  toes bilaterally. No evidence of bacterial infection or drainage bilaterally. Pincer nails.  Orthopedic  No limitations of motion  feet .  No crepitus or effusions noted.  No bony pathology or digital deformities noted.  Skin  normotropic skin with no porokeratosis noted bilaterally.  No signs of infections or ulcers noted.     Onychomycosis  Pain in right toes  Pain in left toes  Consent was obtained for treatment procedures.   Mechanical debridement of nails 1-5  bilaterally performed with a nail nipper.  Filed with dremel without incident.    Return office visit   4 months                   Told patient to return for periodic foot care and evaluation due to potential at risk complications.   Helane Gunther DPM

## 2022-08-01 ENCOUNTER — Encounter: Payer: Self-pay | Admitting: Podiatry

## 2022-08-01 ENCOUNTER — Ambulatory Visit (INDEPENDENT_AMBULATORY_CARE_PROVIDER_SITE_OTHER): Payer: Medicare Other | Admitting: Podiatry

## 2022-08-01 DIAGNOSIS — M79674 Pain in right toe(s): Secondary | ICD-10-CM

## 2022-08-01 DIAGNOSIS — M79675 Pain in left toe(s): Secondary | ICD-10-CM | POA: Diagnosis not present

## 2022-08-01 DIAGNOSIS — B351 Tinea unguium: Secondary | ICD-10-CM

## 2022-08-01 DIAGNOSIS — N1831 Chronic kidney disease, stage 3a: Secondary | ICD-10-CM

## 2022-08-01 NOTE — Progress Notes (Signed)
This patient returns to my office for at risk foot care.  This patient requires this care by a professional since this patient will be at risk due to having CKD.    This patient is unable to cut nails himself since the patient cannot reach his nails.These nails are painful walking and wearing shoes.  This patient presents for at risk foot care today.  General Appearance  Alert, conversant and in no acute stress.  Vascular  Dorsalis pedis and posterior tibial  pulses are palpable  bilaterally.  Capillary return is within normal limits  bilaterally. Temperature is within normal limits  bilaterally.  Neurologic  Senn-Weinstein monofilament wire test within normal limits  bilaterally. Muscle power within normal limits bilaterally.  Nails Thick disfigured discolored nails with subungual debris  from hallux to third  toes bilaterally. No evidence of bacterial infection or drainage bilaterally. Pincer nails.  Orthopedic  No limitations of motion  feet .  No crepitus or effusions noted.  No bony pathology or digital deformities noted.  Skin  normotropic skin with no porokeratosis noted bilaterally.  No signs of infections or ulcers noted.     Onychomycosis  Pain in right toes  Pain in left toes  Consent was obtained for treatment procedures.   Mechanical debridement of nails 1-5  bilaterally performed with a nail nipper.  Filed with dremel without incident.    Return office visit   3 months                   Told patient to return for periodic foot care and evaluation due to potential at risk complications.   Helane Gunther DPM

## 2022-10-22 ENCOUNTER — Other Ambulatory Visit: Payer: Self-pay

## 2022-10-22 ENCOUNTER — Emergency Department (HOSPITAL_COMMUNITY): Payer: Medicare Other

## 2022-10-22 ENCOUNTER — Encounter (HOSPITAL_COMMUNITY): Payer: Self-pay | Admitting: Emergency Medicine

## 2022-10-22 ENCOUNTER — Observation Stay (HOSPITAL_COMMUNITY)
Admission: EM | Admit: 2022-10-22 | Discharge: 2022-10-23 | Disposition: A | Discharge status: Skilled Nursing Facility | Payer: Medicare Other | Attending: Family Medicine | Admitting: Family Medicine

## 2022-10-22 DIAGNOSIS — R531 Weakness: Secondary | ICD-10-CM | POA: Diagnosis present

## 2022-10-22 DIAGNOSIS — R627 Adult failure to thrive: Principal | ICD-10-CM | POA: Insufficient documentation

## 2022-10-22 DIAGNOSIS — N1831 Chronic kidney disease, stage 3a: Secondary | ICD-10-CM | POA: Insufficient documentation

## 2022-10-22 DIAGNOSIS — E876 Hypokalemia: Secondary | ICD-10-CM | POA: Diagnosis not present

## 2022-10-22 DIAGNOSIS — R0902 Hypoxemia: Secondary | ICD-10-CM | POA: Diagnosis not present

## 2022-10-22 DIAGNOSIS — T56891A Toxic effect of other metals, accidental (unintentional), initial encounter: Secondary | ICD-10-CM | POA: Diagnosis present

## 2022-10-22 DIAGNOSIS — R6251 Failure to thrive (child): Secondary | ICD-10-CM | POA: Diagnosis present

## 2022-10-22 DIAGNOSIS — J449 Chronic obstructive pulmonary disease, unspecified: Secondary | ICD-10-CM | POA: Diagnosis not present

## 2022-10-22 DIAGNOSIS — N138 Other obstructive and reflux uropathy: Secondary | ICD-10-CM | POA: Diagnosis present

## 2022-10-22 DIAGNOSIS — Z7952 Long term (current) use of systemic steroids: Secondary | ICD-10-CM | POA: Diagnosis not present

## 2022-10-22 DIAGNOSIS — F209 Schizophrenia, unspecified: Secondary | ICD-10-CM | POA: Diagnosis present

## 2022-10-22 DIAGNOSIS — Z79899 Other long term (current) drug therapy: Secondary | ICD-10-CM | POA: Insufficient documentation

## 2022-10-22 DIAGNOSIS — N179 Acute kidney failure, unspecified: Secondary | ICD-10-CM | POA: Diagnosis not present

## 2022-10-22 DIAGNOSIS — I129 Hypertensive chronic kidney disease with stage 1 through stage 4 chronic kidney disease, or unspecified chronic kidney disease: Secondary | ICD-10-CM | POA: Insufficient documentation

## 2022-10-22 DIAGNOSIS — E440 Moderate protein-calorie malnutrition: Secondary | ICD-10-CM | POA: Diagnosis not present

## 2022-10-22 DIAGNOSIS — E860 Dehydration: Secondary | ICD-10-CM | POA: Diagnosis not present

## 2022-10-22 DIAGNOSIS — F17200 Nicotine dependence, unspecified, uncomplicated: Secondary | ICD-10-CM | POA: Insufficient documentation

## 2022-10-22 DIAGNOSIS — D518 Other vitamin B12 deficiency anemias: Secondary | ICD-10-CM | POA: Diagnosis present

## 2022-10-22 DIAGNOSIS — I10 Essential (primary) hypertension: Secondary | ICD-10-CM | POA: Diagnosis present

## 2022-10-22 DIAGNOSIS — N401 Enlarged prostate with lower urinary tract symptoms: Secondary | ICD-10-CM | POA: Diagnosis present

## 2022-10-22 DIAGNOSIS — D649 Anemia, unspecified: Secondary | ICD-10-CM | POA: Diagnosis not present

## 2022-10-22 LAB — COMPREHENSIVE METABOLIC PANEL
ALT: 11 U/L (ref 0–44)
AST: 13 U/L — ABNORMAL LOW (ref 15–41)
Albumin: 2.9 g/dL — ABNORMAL LOW (ref 3.5–5.0)
Alkaline Phosphatase: 50 U/L (ref 38–126)
Anion gap: 7 (ref 5–15)
BUN: 17 mg/dL (ref 8–23)
CO2: 27 mmol/L (ref 22–32)
Calcium: 9 mg/dL (ref 8.9–10.3)
Chloride: 104 mmol/L (ref 98–111)
Creatinine, Ser: 1.97 mg/dL — ABNORMAL HIGH (ref 0.61–1.24)
GFR, Estimated: 37 mL/min — ABNORMAL LOW (ref >60)
Glucose, Bld: 97 mg/dL (ref 70–99)
Potassium: 3.3 mmol/L — ABNORMAL LOW (ref 3.5–5.1)
Sodium: 138 mmol/L (ref 135–145)
Total Bilirubin: 0.5 mg/dL (ref 0.3–1.2)
Total Protein: 5.9 g/dL — ABNORMAL LOW (ref 6.5–8.1)

## 2022-10-22 LAB — PHOSPHORUS: Phosphorus: 3 mg/dL (ref 2.5–4.6)

## 2022-10-22 LAB — CBC
HCT: 37.8 % — ABNORMAL LOW (ref 39.0–52.0)
Hemoglobin: 11.9 g/dL — ABNORMAL LOW (ref 13.0–17.0)
MCH: 32.2 pg (ref 26.0–34.0)
MCHC: 31.5 g/dL (ref 30.0–36.0)
MCV: 102.2 fL — ABNORMAL HIGH (ref 80.0–100.0)
Platelets: 274 10*3/uL (ref 150–400)
RBC: 3.7 MIL/uL — ABNORMAL LOW (ref 4.22–5.81)
RDW: 14.5 % (ref 11.5–15.5)
WBC: 9.1 10*3/uL (ref 4.0–10.5)
nRBC: 0 % (ref 0.0–0.2)

## 2022-10-22 LAB — BRAIN NATRIURETIC PEPTIDE: B Natriuretic Peptide: 118 pg/mL — ABNORMAL HIGH (ref 0.0–100.0)

## 2022-10-22 LAB — LITHIUM LEVEL: Lithium Lvl: 1.5 mmol/L — ABNORMAL HIGH (ref 0.60–1.20)

## 2022-10-22 LAB — MAGNESIUM: Magnesium: 2.1 mg/dL (ref 1.7–2.4)

## 2022-10-22 LAB — TROPONIN I (HIGH SENSITIVITY): Troponin I (High Sensitivity): 5 ng/L (ref <18)

## 2022-10-22 LAB — TSH: TSH: 1.866 u[IU]/mL (ref 0.350–4.500)

## 2022-10-22 MED ORDER — OLANZAPINE 5 MG PO TABS
5.0000 mg | ORAL_TABLET | Freq: Every day | ORAL | Status: DC
  Administered 2022-10-22: 5 mg via ORAL
  Filled 2022-10-22: qty 1

## 2022-10-22 MED ORDER — DEXTROSE-NACL 5-0.45 % IV SOLN: INTRAVENOUS | Status: DC

## 2022-10-22 MED ORDER — LACTULOSE 10 GM/15ML PO SOLN
20.0000 g | Freq: Every day | ORAL | Status: DC
  Administered 2022-10-22 – 2022-10-23 (×2): 20 g via ORAL
  Filled 2022-10-22 (×2): qty 30

## 2022-10-22 MED ORDER — HYDROMORPHONE HCL 1 MG/ML IJ SOLN: 0.5000 mg | INTRAMUSCULAR | Status: DC | PRN

## 2022-10-22 MED ORDER — IPRATROPIUM BROMIDE 0.02 % IN SOLN: 0.5000 mg | Freq: Four times a day (QID) | RESPIRATORY_TRACT | Status: DC | PRN

## 2022-10-22 MED ORDER — ISOSORBIDE MONONITRATE ER 60 MG PO TB24
30.0000 mg | ORAL_TABLET | Freq: Every day | ORAL | Status: DC
  Administered 2022-10-23: 30 mg via ORAL
  Filled 2022-10-22: qty 1

## 2022-10-22 MED ORDER — HYDRALAZINE HCL 25 MG PO TABS
50.0000 mg | ORAL_TABLET | Freq: Three times a day (TID) | ORAL | Status: DC
  Administered 2022-10-22 – 2022-10-23 (×3): 50 mg via ORAL
  Filled 2022-10-22 (×3): qty 2

## 2022-10-22 MED ORDER — OXYCODONE HCL 5 MG PO TABS: 5.0000 mg | ORAL_TABLET | ORAL | Status: DC | PRN

## 2022-10-22 MED ORDER — TRAZODONE HCL 50 MG PO TABS: 25.0000 mg | ORAL_TABLET | Freq: Every evening | ORAL | Status: DC | PRN

## 2022-10-22 MED ORDER — UMECLIDINIUM BROMIDE 62.5 MCG/ACT IN AEPB
1.0000 | INHALATION_SPRAY | Freq: Every day | RESPIRATORY_TRACT | Status: DC
  Administered 2022-10-23: 1 via RESPIRATORY_TRACT
  Filled 2022-10-22: qty 7

## 2022-10-22 MED ORDER — SODIUM CHLORIDE 0.9 % IV SOLN: 250.0000 mL | INTRAVENOUS | Status: DC | PRN

## 2022-10-22 MED ORDER — HEPARIN SODIUM (PORCINE) 5000 UNIT/ML IJ SOLN
5000.0000 [IU] | Freq: Three times a day (TID) | INTRAMUSCULAR | Status: DC
  Administered 2022-10-22 – 2022-10-23 (×2): 5000 [IU] via SUBCUTANEOUS
  Filled 2022-10-22 (×2): qty 1

## 2022-10-22 MED ORDER — SENNOSIDES-DOCUSATE SODIUM 8.6-50 MG PO TABS: 1.0000 | ORAL_TABLET | Freq: Every evening | ORAL | Status: DC | PRN

## 2022-10-22 MED ORDER — TAMSULOSIN HCL 0.4 MG PO CAPS
0.4000 mg | ORAL_CAPSULE | Freq: Every day | ORAL | Status: DC
  Administered 2022-10-23: 0.4 mg via ORAL
  Filled 2022-10-22: qty 1

## 2022-10-22 MED ORDER — TRAZODONE HCL 50 MG PO TABS: 50.0000 mg | ORAL_TABLET | Freq: Every evening | ORAL | Status: DC | PRN

## 2022-10-22 MED ORDER — SODIUM CHLORIDE 0.9 % IV BOLUS
1000.0000 mL | Freq: Once | INTRAVENOUS | Status: AC
  Administered 2022-10-22: 1000 mL via INTRAVENOUS

## 2022-10-22 MED ORDER — NICOTINE 7 MG/24HR TD PT24
7.0000 mg | MEDICATED_PATCH | Freq: Every day | TRANSDERMAL | Status: DC
  Administered 2022-10-22 – 2022-10-23 (×2): 7 mg via TRANSDERMAL
  Filled 2022-10-22 (×2): qty 1

## 2022-10-22 MED ORDER — LACTATED RINGERS IV BOLUS
1000.0000 mL | Freq: Once | INTRAVENOUS | Status: AC
  Administered 2022-10-22: 1000 mL via INTRAVENOUS

## 2022-10-22 MED ORDER — LEVALBUTEROL HCL 0.63 MG/3ML IN NEBU: 0.6300 mg | INHALATION_SOLUTION | Freq: Four times a day (QID) | RESPIRATORY_TRACT | Status: DC | PRN

## 2022-10-22 MED ORDER — ONDANSETRON HCL 4 MG PO TABS: 4.0000 mg | ORAL_TABLET | Freq: Four times a day (QID) | ORAL | Status: DC | PRN

## 2022-10-22 MED ORDER — SENNOSIDES-DOCUSATE SODIUM 8.6-50 MG PO TABS
1.0000 | ORAL_TABLET | Freq: Every day | ORAL | Status: DC
  Administered 2022-10-23: 1 via ORAL
  Filled 2022-10-22: qty 1

## 2022-10-22 MED ORDER — HYDRALAZINE HCL 20 MG/ML IJ SOLN: 10.0000 mg | INTRAMUSCULAR | Status: DC | PRN

## 2022-10-22 MED ORDER — FLEET ENEMA 7-19 GM/118ML RE ENEM: 1.0000 | ENEMA | Freq: Once | RECTAL | Status: DC | PRN

## 2022-10-22 MED ORDER — FLUOXETINE HCL 20 MG PO CAPS
20.0000 mg | ORAL_CAPSULE | Freq: Every day | ORAL | Status: DC
  Administered 2022-10-22 – 2022-10-23 (×2): 20 mg via ORAL
  Filled 2022-10-22 (×2): qty 1

## 2022-10-22 MED ORDER — POTASSIUM CHLORIDE CRYS ER 20 MEQ PO TBCR
40.0000 meq | EXTENDED_RELEASE_TABLET | Freq: Once | ORAL | Status: AC
  Administered 2022-10-22: 40 meq via ORAL
  Filled 2022-10-22: qty 2

## 2022-10-22 MED ORDER — SODIUM CHLORIDE 0.9% FLUSH
3.0000 mL | Freq: Two times a day (BID) | INTRAVENOUS | Status: DC
  Administered 2022-10-22 – 2022-10-23 (×2): 3 mL via INTRAVENOUS

## 2022-10-22 MED ORDER — SODIUM CHLORIDE 0.9% FLUSH: 3.0000 mL | INTRAVENOUS | Status: DC | PRN

## 2022-10-22 MED ORDER — MIRTAZAPINE 30 MG PO TABS
30.0000 mg | ORAL_TABLET | Freq: Every day | ORAL | Status: DC
  Administered 2022-10-22: 30 mg via ORAL
  Filled 2022-10-22: qty 1

## 2022-10-22 MED ORDER — PNEUMOCOCCAL 20-VAL CONJ VACC 0.5 ML IM SUSY: 0.5000 mL | PREFILLED_SYRINGE | INTRAMUSCULAR | Status: DC

## 2022-10-22 MED ORDER — FINASTERIDE 5 MG PO TABS
5.0000 mg | ORAL_TABLET | Freq: Every day | ORAL | Status: DC
  Administered 2022-10-23: 5 mg via ORAL
  Filled 2022-10-22: qty 1

## 2022-10-22 MED ORDER — ACETAMINOPHEN 325 MG PO TABS: 650.0000 mg | ORAL_TABLET | Freq: Four times a day (QID) | ORAL | Status: DC | PRN

## 2022-10-22 MED ORDER — TIOTROPIUM BROMIDE MONOHYDRATE 18 MCG IN CAPS: 18.0000 ug | ORAL_CAPSULE | Freq: Every day | RESPIRATORY_TRACT | Status: DC

## 2022-10-22 MED ORDER — VITAMIN B-12 1000 MCG PO TABS
1000.0000 ug | ORAL_TABLET | Freq: Every day | ORAL | Status: DC
  Administered 2022-10-23: 1000 ug via ORAL
  Filled 2022-10-22: qty 1

## 2022-10-22 MED ORDER — ACETAMINOPHEN 650 MG RE SUPP: 650.0000 mg | Freq: Four times a day (QID) | RECTAL | Status: DC | PRN

## 2022-10-22 MED ORDER — VITAMIN D 25 MCG (1000 UNIT) PO TABS
2000.0000 [IU] | ORAL_TABLET | Freq: Every day | ORAL | Status: DC
  Administered 2022-10-23: 2000 [IU] via ORAL
  Filled 2022-10-22: qty 2

## 2022-10-22 MED ORDER — BISACODYL 5 MG PO TBEC: 5.0000 mg | DELAYED_RELEASE_TABLET | Freq: Every day | ORAL | Status: DC | PRN

## 2022-10-22 MED ORDER — ONDANSETRON HCL 4 MG/2ML IJ SOLN: 4.0000 mg | Freq: Four times a day (QID) | INTRAMUSCULAR | Status: DC | PRN

## 2022-10-22 MED ORDER — SODIUM CHLORIDE 0.9% FLUSH
3.0000 mL | Freq: Two times a day (BID) | INTRAVENOUS | Status: DC
  Administered 2022-10-22 (×2): 3 mL via INTRAVENOUS

## 2022-10-22 NOTE — Assessment & Plan Note (Signed)
-  Serum (M level 1.5 (0.6-1.2) Mildly elevated, -We will holding lithium -Continue IV fluids, monitoring levels

## 2022-10-22 NOTE — Assessment & Plan Note (Signed)
-   Stable, reviewing home medication, and resuming Imdur, Norvasc, holding hydralazine for now -As needed IV hydralazine

## 2022-10-22 NOTE — Assessment & Plan Note (Signed)
-   Currently stable, resuming home medication of fluoxetine, holding lithium as elevated levels -Continue Remeron, Zyprexa, -On monthly injection of IM Invega

## 2022-10-22 NOTE — Assessment & Plan Note (Signed)
-  Nutrition consult, calorie count, dietary supplement

## 2022-10-22 NOTE — ED Notes (Signed)
Per MD, pt may eat and drink. Pt given water and crackers

## 2022-10-22 NOTE — Assessment & Plan Note (Signed)
-  Currently stable, no signs of exacerbation -supplemental oxygen as needed -No signs of exacerbation DuoNeb bronchodilators

## 2022-10-22 NOTE — ED Provider Notes (Signed)
[facility] Provider Note   CSN: [id number] Arrival date & time: 10/22/22  [id number]     History {Add pertinent medical, surgical, social history, OB history to HPI:1} Chief Complaint  Patient presents with   Failure To Thrive   Weakness    Ray Jackson is a 67 y.o. male.  Patient with hx copd, from SNF with generalized weakness in past several days. Pt denies any focal or unilateral numbness/weakness. Pt limited historian - level 5 caveat. Has noted decreased po intake in past couple weeks, poor appetite. Indicates unsure if wt loss. Facility reports 20 lb wt loss. Pt denies headache. No chest pain or sob. No abd pain or vomiting/diarrhea. No dysuria. No extremity pain or swelling. Denies trauma or fall or syncope. Pt denies change in meds.   The history is provided by the patient, the EMS personnel and medical records. The history is limited by the condition of the patient.  Weakness Associated symptoms: no abdominal pain, no chest pain, no diarrhea, no dysuria, no fever, no headaches, no shortness of breath and no vomiting        Home Medications Prior to Admission medications   Medication Sig Start Date End Date Taking? Authorizing Provider  acetaminophen (TYLENOL) 325 MG tablet Take 2 tablets (650 mg total) by mouth every 6 (six) hours as needed for mild pain (or Fever >/= 101). 05/10/18   Emokpae, Courage, MD  amLODipine (NORVASC) 5 MG tablet Take 1 tablet (5 mg total) by mouth daily. 05/11/18   Roxan Hockey, MD  cetirizine (ZYRTEC) 10 MG tablet Take 10 mg by mouth daily.     [provider]  Cholecalciferol (VITAMIN D3) 2000 units capsule Take 2,000 Units by mouth daily.    [provider]  clonazePAM (KLONOPIN) 1 MG tablet Take 1 mg by mouth at bedtime.     [provider]  diclofenac (VOLTAREN) 50 MG EC tablet Take 50 mg by mouth daily as needed. 10/26/20   [provider]  finasteride (PROSCAR) 5 MG  tablet Take 5 mg by mouth daily.    [provider]  FLUoxetine (PROZAC) 20 MG capsule Take 20 mg by mouth daily. 12/19/20   [provider]  FLUoxetine (PROZAC) 20 MG tablet Take 20 mg by mouth daily.    [provider]  fluticasone (FLONASE) 50 MCG/ACT nasal spray Place 2 sprays into both nostrils daily.    [provider]  hydrALAZINE (APRESOLINE) 50 MG tablet Take 1 tablet (50 mg total) by mouth 3 (three) times daily. 05/10/18   Roxan Hockey, MD  isosorbide mononitrate (IMDUR) 30 MG 24 hr tablet Take 1 tablet (30 mg total) by mouth daily. 05/11/18   Roxan Hockey, MD  lactulose (CHRONULAC) 10 GM/15ML solution Take 30 mLs (20 g total) by mouth daily. 05/10/18   Roxan Hockey, MD  lithium carbonate (ESKALITH) 450 MG CR tablet Take 450 mg by mouth 2 (two) times daily.    [provider]  mirtazapine (REMERON) 30 MG tablet Take 30 mg by mouth at bedtime.    [provider]  OLANZapine (ZYPREXA) 5 MG tablet Take 5 mg by mouth at bedtime. 12/19/20   [provider]  paliperidone (INVEGA SUSTENNA) 156 MG/ML SUSP injection Inject 1 mL (156 mg total) into the muscle every 30 (thirty) days. Next dose due on 4/16 11/14/17   McNew, Tyson Babinski, MD  Paliperidone Palmitate ER (INVEGA TRINZA) 546 MG/1.75ML SUSY Inject 546 mg into the  muscle See admin instructions. Every 3 months    [provider]  polyethylene glycol (MIRALAX / GLYCOLAX) packet Take 17 g by mouth daily.    [provider]  Polyethylene Glycol 3350 (GAVILAX PO) Take 17 g by mouth 2 (two) times daily. Mix 1 capful (17 g) in 8 oz of juice/water and drink twice a day.    [provider]  Potassium Chloride ER 20 MEQ TBCR Take 20 mEq by mouth daily for 3 days. 1 tab daily by mouth 05/10/18 05/13/18  Roxan Hockey, MD  potassium chloride SA (KLOR-CON M) 20 MEQ tablet Take 1 tablet (20 mEq total) by mouth daily for 5 days. 03/16/22 03/21/22  Noemi Chapel, MD   risperiDONE (RISPERDAL M-TABS) 1 MG disintegrating tablet Take 1 tablet by mouth at bedtime. 09/12/17   [provider]  senna-docusate (SENOKOT-S) 8.6-50 MG tablet Take 1 tablet by mouth daily.    [provider]  tamsulosin (FLOMAX) 0.4 MG CAPS capsule Take 0.4 mg by mouth daily.    [provider]  tiotropium (SPIRIVA) 18 MCG inhalation capsule Place 18 mcg into inhaler and inhale daily.    [provider]  traZODone (DESYREL) 50 MG tablet Take 1 tablet (50 mg total) by mouth at bedtime. 05/10/18   Roxan Hockey, MD  vitamin B-12 (CYANOCOBALAMIN) 1000 MCG tablet Take 1,000 mcg by mouth daily.    [provider]      Allergies    Patient has no known allergies.    Review of Systems   Review of Systems  Constitutional:  Positive for unexpected weight change. Negative for chills and fever.  HENT:  Negative for sore throat.   Eyes:  Negative for visual disturbance.  Respiratory:  Negative for shortness of breath.   Cardiovascular:  Negative for chest pain.  Gastrointestinal:  Negative for abdominal pain, diarrhea and vomiting.  Genitourinary:  Negative for dysuria and flank pain.  Musculoskeletal:  Negative for back pain and neck pain.  Skin:  Negative for rash.  Neurological:  Positive for weakness. Negative for headaches.  Hematological:  Does not bruise/bleed easily.    Physical Exam Updated Vital Signs BP 122/80   Pulse 63   Temp 98.1 F (36.7 C) (Oral)   Resp 16   SpO2 97%  Physical Exam Vitals and nursing note reviewed.  Constitutional:      Appearance: He is well-developed.     Comments: Weak/frail appearing.   HENT:     Head: Atraumatic.     Nose: Nose normal.     Mouth/Throat:     Mouth: Mucous membranes are dry.     Pharynx: Oropharynx is clear.     Comments: Dry mucous membranes Eyes:     General: No scleral icterus.    Conjunctiva/sclera: Conjunctivae normal.  Neck:     Vascular: No carotid bruit.      Trachea: No tracheal deviation.     Comments: Thyroid not grossly enlarged or tender. Trachea midline. No neck stiffness or rigidity.  Cardiovascular:     Rate and Rhythm: Normal rate and regular rhythm.     Pulses: Normal pulses.     Heart sounds: Normal heart sounds. No murmur heard.    No friction rub. No gallop.  Pulmonary:     Effort: Pulmonary effort is normal. No accessory muscle usage or respiratory distress.     Breath sounds: Normal breath sounds.  Abdominal:     General: Bowel sounds are normal. There is no distension.  Palpations: Abdomen is soft. There is no mass.     Tenderness: There is no abdominal tenderness. There is no guarding.  Genitourinary:    Comments: No cva tenderness. Musculoskeletal:        General: No swelling or tenderness.     Cervical back: Normal range of motion and neck supple. No rigidity or tenderness.  Skin:    General: Skin is warm and dry.     Findings: No rash.  Neurological:     Mental Status: He is alert.     Comments: Alert, speech clear. Motor/sens grossly intact bil.   Psychiatric:        Mood and Affect: Mood normal.     ED Results / Procedures / Treatments   Labs (all labs ordered are listed, but only abnormal results are displayed) Results for orders placed or performed during the hospital encounter of 10/22/22  Comprehensive metabolic panel  Result Value Ref Range   Sodium 138 135 - 145 mmol/L   Potassium 3.3 (L) 3.5 - 5.1 mmol/L   Chloride 104 98 - 111 mmol/L   CO2 27 22 - 32 mmol/L   Glucose, Bld 97 70 - 99 mg/dL   BUN 17 8 - 23 mg/dL   Creatinine, Ser 1.97 (H) 0.61 - 1.24 mg/dL   Calcium 9.0 8.9 - 10.3 mg/dL   Total Protein 5.9 (L) 6.5 - 8.1 g/dL   Albumin 2.9 (L) 3.5 - 5.0 g/dL   AST 13 (L) 15 - 41 U/L   ALT 11 0 - 44 U/L   Alkaline Phosphatase 50 38 - 126 U/L   Total Bilirubin 0.5 0.3 - 1.2 mg/dL   GFR, Estimated 37 (L) >60 mL/min   Anion gap 7 5 - 15  CBC  Result Value Ref Range   WBC 9.1 4.0 - 10.5 K/uL    RBC 3.70 (L) 4.22 - 5.81 MIL/uL   Hemoglobin 11.9 (L) 13.0 - 17.0 g/dL   HCT 37.8 (L) 39.0 - 52.0 %   MCV 102.2 (H) 80.0 - 100.0 fL   MCH 32.2 26.0 - 34.0 pg   MCHC 31.5 30.0 - 36.0 g/dL   RDW 14.5 11.5 - 15.5 %   Platelets 274 150 - 400 K/uL   nRBC 0.0 0.0 - 0.2 %  Brain natriuretic peptide  Result Value Ref Range   B Natriuretic Peptide 118.0 (H) 0.0 - 100.0 pg/mL  Lithium level  Result Value Ref Range   Lithium Lvl 1.50 (H) 0.60 - 1.20 mmol/L  Troponin I (High Sensitivity)  Result Value Ref Range   Troponin I (High Sensitivity) 5 <18 ng/L     EKG EKG Interpretation  Date/Time:  Monday October 22 2022 09:03:37 EST Ventricular Rate:  63 PR Interval:  137 QRS Duration: 105 QT Interval:  440 QTC Calculation: 451 R Axis:   26 Text Interpretation: Sinus rhythm Confirmed by Lajean Saver [phone]) on 10/22/2022 9:09:36 AM  Radiology DG Chest Port 1 View  Result Date: 10/22/2022 CLINICAL DATA:  Weakness EXAM: PORTABLE CHEST 1 VIEW COMPARISON:  10/19/2019 FINDINGS: Stable cardiomediastinal contours. Aortic atherosclerosis. Chronically hyperinflated lungs with coarsened interstitial markings. No focal airspace consolidation, pleural effusion, or pneumothorax. Remote right-sided rib deformities. IMPRESSION: Chronic lung changes without acute cardiopulmonary process. Electronically Signed   By: Davina Poke D.O.   On: 10/22/2022 09:54    Procedures Procedures  {Document cardiac monitor, telemetry assessment procedure when appropriate:1}  Medications Ordered in ED Medications  lactated ringers bolus 1,000 mL (has no administration  in time range)  potassium chloride SA (KLOR-CON M) CR tablet 40 mEq (has no administration in time range)  sodium chloride 0.9 % bolus 1,000 mL (0 mLs Intravenous Stopped 10/22/22 1051)    ED Course/ Medical Decision Making/ A&P   {   Click here for ABCD2, HEART and other calculatorsREFRESH Note before signing :1}                          Medical  Decision Making Amount and/or Complexity of Data Reviewed Labs: ordered. Radiology: ordered.  Risk Prescription drug management.   Iv ns. Continuous pulse ox and cardiac monitoring. Labs ordered/sent. Imaging ordered.   Differential diagnosis includes dehydration, aki, pna, mass/ca, etc . Dispo decision including potential need for admission considered - will get labs and imaging and reassess.   Reviewed nursing notes and prior charts for additional history. External reports reviewed. Additional history from: EMS.   Cardiac monitor: sinus rhythm, rate 77.  IVF bolus.   Labs reviewed/interpreted by me - mild aki. K mildly low. Add mg. LR boluses. Kcl. Lithium level is high, hold lithium, ivf.   Xrays reviewed/interpreted by me - no pna.      {Document critical care time when appropriate:1} {Document review of labs and clinical decision tools ie heart score, Chads2Vasc2 etc:1}  {Document your independent review of radiology images, and any outside records:1} {Document your discussion with family members, caretakers, and with consultants:1} {Document social determinants of health affecting pt's care:1} {Document your decision making why or why not admission, treatments were needed:1} Final Clinical Impression(s) / ED Diagnoses Final diagnoses:  Dehydration  AKI (acute kidney injury) ([facility])  Lithium toxicity, accidental or unintentional, initial encounter  Hypokalemia    Rx / DC Orders ED Discharge Orders     None

## 2022-10-22 NOTE — Assessment & Plan Note (Signed)
Anemia of chronic disease including B12 deficiency, H&H stable

## 2022-10-22 NOTE — ED Notes (Signed)
Pt drinking water and eating crackers.  Admitting MD in room assessing pt.

## 2022-10-22 NOTE — H&P (Addendum)
History and Physical   Patient: Ray Jackson                            PCP: Anselm Jungling, NP                    DOB: [birth year]            DOA: 10/22/2022 ZOX:[id number]             DOS: 11/27/2022, 7:42 PM  Anselm Jungling, NP  Patient coming from:   HOME  I have personally reviewed patient's medical records, in electronic medical records, including:  [employer] [facility], and care everywhere.    Chief Complaint:   Chief Complaint  Patient presents with   Failure To Thrive   Weakness    History of present illness:    Ray Jackson is a 67 year old male SNF with past medical history of schizophrenia, HTN, BPH, CKD, COPD, chronic anemia with B12 deficiency... Presented a history of poor appetite weakness and weight loss. Appears to have been progressively getting worse over past few days, denies have having asymmetric weaknesses numbness tingling patient is a poor historian facility reports about 20 pound weight loss, poor appetite over past week.  Denies of having any recent illnesses, denies of any nausea or vomiting or hematochezia denies any dysuria.  Denies any pain.    ED course: Blood pressure 122/80, pulse 63, temperature 98.1 F (36.7 C), Oral, resp. rate 16, SpO2 97 %. CBC CMP reviewed, WBC 9.1, hemoglobin 11.9, sodium 138, potassium 3.3, creatinine 1.97, BNP 118, troponin 5, lithium elevated at 1.5 (normal 0.6-1.2)  Chest x-ray within normal notes, no changes in EKG Patient is mild lethargic otherwise responsive, poor historian No signs of significant encephalopathy  Requested for patient to be admitted for failure to thrive, elevated creatinine, elevated lithium level    Patient Denies having: Fever, Chills, Cough, SOB, Chest Pain, Abd pain, N/V/D, headache, dizziness, lightheadedness,  Dysuria, Joint pain, rash, open wounds     Review of Systems: As per HPI, otherwise 10 point review of systems were negative.    ----------------------------------------------------------------------------------------------------------------------  No Known Allergies  Home MEDs:  Prior to Admission medications   Medication Sig Start Date End Date Taking? Authorizing Provider  acetaminophen (TYLENOL) 325 MG tablet Take 2 tablets (650 mg total) by mouth every 6 (six) hours as needed for mild pain (or Fever >/= 101). 05/10/18   Emokpae, Courage, MD  amLODipine (NORVASC) 5 MG tablet Take 1 tablet (5 mg total) by mouth daily. 05/11/18   Shon Hale, MD  cetirizine (ZYRTEC) 10 MG tablet Take 10 mg by mouth daily.     [provider]  Cholecalciferol (VITAMIN D3) 2000 units capsule Take 2,000 Units by mouth daily.    [provider]  clonazePAM (KLONOPIN) 1 MG tablet Take 1 mg by mouth at bedtime.     [provider]  diclofenac (VOLTAREN) 50 MG EC tablet Take 50 mg by mouth daily as needed. 10/26/20   [provider]  finasteride (PROSCAR) 5 MG tablet Take 5 mg by mouth daily.    [provider]  FLUoxetine (PROZAC) 20 MG capsule Take 20 mg by mouth daily. 12/19/20   [provider]  FLUoxetine (PROZAC) 20 MG tablet Take 20 mg by mouth daily.    [provider]  fluticasone (FLONASE) 50 MCG/ACT nasal spray Place 2 sprays into both nostrils daily.    [provider]  hydrALAZINE (APRESOLINE) 50 MG tablet Take 1 tablet (50 mg total) by mouth 3 (three) times daily. 05/10/18   Shon Hale, MD  isosorbide mononitrate (IMDUR) 30 MG 24 hr tablet Take 1 tablet (30 mg total) by mouth daily. 05/11/18   Shon Hale, MD  lactulose (CHRONULAC) 10 GM/15ML solution Take 30 mLs (20 g total) by mouth daily. 05/10/18   Shon Hale, MD  lithium carbonate (ESKALITH) 450 MG CR tablet Take 450 mg by mouth 2 (two) times daily.    [provider]  mirtazapine (REMERON) 30 MG tablet Take 30 mg by mouth at bedtime.    [provider]  OLANZapine  (ZYPREXA) 5 MG tablet Take 5 mg by mouth at bedtime. 12/19/20   [provider]  paliperidone (INVEGA SUSTENNA) 156 MG/ML SUSP injection Inject 1 mL (156 mg total) into the muscle every 30 (thirty) days. Next dose due on 4/16 11/14/17   McNew, Ileene Hutchinson, MD  Paliperidone Palmitate ER (INVEGA TRINZA) 546 MG/1.75ML SUSY Inject 546 mg into the muscle See admin instructions. Every 3 months    [provider]  polyethylene glycol (MIRALAX / GLYCOLAX) packet Take 17 g by mouth daily.    [provider]  Polyethylene Glycol 3350 (GAVILAX PO) Take 17 g by mouth 2 (two) times daily. Mix 1 capful (17 g) in 8 oz of juice/water and drink twice a day.    [provider]  Potassium Chloride ER 20 MEQ TBCR Take 20 mEq by mouth daily for 3 days. 1 tab daily by mouth 05/10/18 05/13/18  Shon Hale, MD  potassium chloride SA (KLOR-CON M) 20 MEQ tablet Take 1 tablet (20 mEq total) by mouth daily for 5 days. 03/16/22 03/21/22  Eber Hong, MD  risperiDONE (RISPERDAL M-TABS) 1 MG disintegrating tablet Take 1 tablet by mouth at bedtime. 09/12/17   [provider]  senna-docusate (SENOKOT-S) 8.6-50 MG tablet Take 1 tablet by mouth daily.    [provider]  tamsulosin (FLOMAX) 0.4 MG CAPS capsule Take 0.4 mg by mouth daily.    [provider]  tiotropium (SPIRIVA) 18 MCG inhalation capsule Place 18 mcg into inhaler and inhale daily.    [provider]  traZODone (DESYREL) 50 MG tablet Take 1 tablet (50 mg total) by mouth at bedtime. 05/10/18   Shon Hale, MD  vitamin B-12 (CYANOCOBALAMIN) 1000 MCG tablet Take 1,000 mcg by mouth daily.    [provider]    PRN MEDs:   Past Medical History:  Diagnosis Date   ARF (acute renal failure) (HCC) 10/02/2017   Constipation    Dyspnea    Hypertension    Hypokalemia    Hyponatremia 10/02/2017   Noncompliance 11/05/2017   Schizophrenia (HCC)     Past Surgical History:  Procedure Laterality  Date   CYSTOSCOPY WITH INSERTION OF UROLIFT N/A 03/24/2018   Procedure: CYSTOSCOPY WITH INSERTION OF UROLIFT;  Surgeon: Malen Gauze, MD;  Location: [facility];  Service: Urology;  Laterality: N/A;   TONSILLECTOMY       reports that he has been smoking. He has been smoking an average of .5 packs per day. He has never used smokeless tobacco. He reports that he does not drink alcohol and does not use drugs.   History reviewed. No pertinent family history.  Physical Exam:   Vitals:   10/23/22 0500 10/23/22 0800 10/23/22 0941 10/23/22 1025  BP:   (!) 120/91   Pulse:      Resp:  Temp:      TempSrc:      SpO2:  93% 96% 97%  Weight: 61.7 kg      Constitutional: NAD, calm, comfortable, Cachectic male  Eyes: PERRL, lids and conjunctivae normal ENMT: Mucous membranes are moist. Posterior pharynx clear of any exudate or lesions.Normal dentition.  Neck: normal, supple, no masses, no thyromegaly Respiratory: clear to auscultation bilaterally, no wheezing, no crackles. Normal respiratory effort. No accessory muscle use.  Cardiovascular: Regular rate and rhythm, no murmurs / rubs / gallops. No extremity edema. 2+ pedal pulses. No carotid bruits.  Abdomen: no tenderness, no masses palpated. No hepatosplenomegaly. Bowel sounds positive.  Musculoskeletal: gen. Muscle loss --global gen. Weakness -no clubbing / cyanosis. No joint deformity upper and lower extremities. Good ROM, no contractures. Normal muscle tone.  Neurologic: CN II-XII grossly intact. Sensation intact, DTR normal. Strength 5/5 in all 4.  Psychiatric: Normal judgment and insight. Alert and oriented x 3. Stable mood.  No Hallucination, Not Suicidal or suicidal  Skin: no rashes, lesions, ulcers. No induration Decubitus/ulcers:  Wounds: per nursing documentation         Labs on admission:    I have personally reviewed following labs and imaging studies  CBC: No results for input(s): "WBC", "NEUTROABS", "HGB", "HCT",  "MCV", "PLT" in the last 168 hours.  Basic Metabolic Panel: No results for input(s): "NA", "K", "CL", "CO2", "GLUCOSE", "BUN", "CREATININE", "CALCIUM", "MG", "PHOS" in the last 168 hours.  GFR: CrCl cannot be calculated (Patient's most recent lab result is older than the maximum 21 days allowed.). Liver Function Tests: No results for input(s): "AST", "ALT", "ALKPHOS", "BILITOT", "PROT", "ALBUMIN" in the last 168 hours.  No results for input(s): "LIPASE", "AMYLASE" in the last 168 hours. No results for input(s): "AMMONIA" in the last 168 hours. Coagulation Profile: No results for input(s): "INR", "PROTIME" in the last 168 hours. Cardiac Enzymes: No results for input(s): "CKTOTAL", "CKMB", "CKMBINDEX", "TROPONINI" in the last 168 hours. BNP (last 3 results) No results for input(s): "PROBNP" in the last 8760 hours. HbA1C: No results for input(s): "HGBA1C" in the last 72 hours. CBG: No results for input(s): "GLUCAP" in the last 168 hours. Lipid Profile: No results for input(s): "CHOL", "HDL", "LDLCALC", "TRIG", "CHOLHDL", "LDLDIRECT" in the last 72 hours. Thyroid Function Tests: No results for input(s): "TSH", "T4TOTAL", "FREET4", "T3FREE", "THYROIDAB" in the last 72 hours. Anemia Panel: No results for input(s): "VITAMINB12", "FOLATE", "FERRITIN", "TIBC", "IRON", "RETICCTPCT" in the last 72 hours. Urine analysis:    Component Value Date/Time   COLORURINE AMBER (A) 03/16/2022 1142   APPEARANCEUR HAZY (A) 03/16/2022 1142   APPEARANCEUR Clear 04/01/2014 0703   LABSPEC 1.006 03/16/2022 1142   LABSPEC 1.006 04/01/2014 0703   PHURINE 6.0 03/16/2022 1142   GLUCOSEU NEGATIVE 03/16/2022 1142   GLUCOSEU Negative 04/01/2014 0703   HGBUR LARGE (A) 03/16/2022 1142   BILIRUBINUR NEGATIVE 03/16/2022 1142   BILIRUBINUR Negative 04/01/2014 0703   KETONESUR NEGATIVE 03/16/2022 1142   PROTEINUR 100 (A) 03/16/2022 1142   NITRITE NEGATIVE 03/16/2022 1142   LEUKOCYTESUR LARGE (A) 03/16/2022 1142    LEUKOCYTESUR Negative 04/01/2014 0703    Last A1C:  Lab Results  Component Value Date   HGBA1C 4.5 (L) 11/06/2017     Radiologic Exams on Admission:   No results found.  EKG:   Independently reviewed.  Orders placed or performed during the hospital encounter of 10/22/22   EKG 12-Lead   EKG 12-Lead   ---------------------------------------------------------------------------------------------------------------------------------------    Assessment / Plan:  Active Problems:   Lithium toxicity   Anemia   Schizophrenia   Hypokalemia   HTN (hypertension)   BPH with urinary obstruction   COPD (chronic obstructive pulmonary disease)   Moderate protein-calorie malnutrition   Stage 3a chronic kidney disease   Vitamin B12 deficiency (dietary) anemia   FTT (failure to thrive) in adult   Assessment and Plan: Lithium toxicity -Serum (M level 1.5 (0.6-1.2) Mildly elevated, -We will holding lithium -Continue IV fluids, monitoring levels   FTT (failure to thrive) in adult -Patient presenting from SNF for poor appetite, weight loss -Seems to be dehydrated -Continue with IV fluids, -Consulting nutrition -Dietary supplements -Supplement dyspnea and adding appetite stimulant  Vitamin B12 deficiency (dietary) anemia - Continue B12 supplements -H&H stable  Stage 3a chronic kidney disease - Elevated creatinine 1.97>>> Baseline creatinine 126-1.83 Continue gentle IV fluid hydration, avoid nephrotoxins -Monitoring BUN/creatinine closely  Moderate protein-calorie malnutrition -Nutrition consult, calorie count, dietary supplement  COPD (chronic obstructive pulmonary disease) -Currently stable, no signs of exacerbation -supplemental oxygen as needed -No signs of exacerbation DuoNeb bronchodilators  BPH with urinary obstruction -Continue home medication of Flomax and finasteride  HTN (hypertension) - Stable, reviewing home medication, and resuming Imdur,  Norvasc, holding hydralazine for now -As needed IV hydralazine  Hypokalemia Repleting potassium orally Checking magnesium  Schizophrenia - Currently stable, resuming home medication of fluoxetine, holding lithium as elevated levels -Continue Remeron, Zyprexa, -On monthly injection of IM Invega  Anemia Anemia of chronic disease including B12 deficiency, H&H stable              Consults called:  None -------------------------------------------------------------------------------------------------------------------------------------------- DVT prophylaxis:     Code Status:   Code Status: Prior   Admission status: Patient will be admitted as Inpatient, with a greater than 2 midnight length of stay. Level of care: Med-Surg   Family Communication:  none at bedside  (The above findings and plan of care has been discussed with patient in detail, the patient expressed understanding and agreement of above plan)  --------------------------------------------------------------------------------------------------------------------------------------------------  Disposition Plan:  Anticipated 1-2 days Status is: Inpatient Remains inpatient appropriate because: Needing IV fluid, evaluation for failure to thrive, elevated lithium ruling out lithium toxicity     ----------------------------------------------------------------------------------------------------------------------------------------------------  Time spent: > than  55  Min.  Was spent seeing and evaluating the patient, reviewing all medical records, drawn plan of care.  SIGNED: Kendell Bane, MD, FHM. FAAFP. [facility], Pager  (Please use [email or web address] to page/ or secure chat through epic) If 7PM-7AM, please contact night-coverage [email or web address],  11/27/2022, 7:42 PM

## 2022-10-22 NOTE — Assessment & Plan Note (Signed)
-   Elevated creatinine 1.97>>> Baseline creatinine 126-1.83 Continue gentle IV fluid hydration, avoid nephrotoxins -Monitoring BUN/creatinine closely

## 2022-10-22 NOTE — Assessment & Plan Note (Signed)
-  Continue home medication of Flomax and finasteride

## 2022-10-22 NOTE — Assessment & Plan Note (Deleted)
-  Patient presenting from SNF for poor appetite, weight loss -Seems to be dehydrated -Continue with IV fluids, -Consulting nutrition -Dietary supplements -Supplement dyspnea and adding appetite stimulant

## 2022-10-22 NOTE — Hospital Course (Signed)
Ray Jackson is a 67 year old male SNF with past medical history of schizophrenia, HTN, BPH, CKD, COPD, chronic anemia with B12 deficiency... Presented a history of poor appetite weakness and weight loss. Appears to have been progressively getting worse over past few days, denies have having asymmetric weaknesses numbness tingling patient is a poor historian facility reports about 20 pound weight loss, poor appetite over past week.  Denies of having any recent illnesses, denies of any nausea or vomiting or hematochezia denies any dysuria.  Denies any pain.    ED course: Blood pressure 122/80, pulse 63, temperature 98.1 F (36.7 C), Oral, resp. rate 16, SpO2 97 %. CBC CMP reviewed, WBC 9.1, hemoglobin 11.9, sodium 138, potassium 3.3, creatinine 1.97, BNP 118, troponin 5, lithium elevated at 1.5 (normal 0.6-1.2)  Chest x-ray within normal notes, no changes in EKG Patient is mild lethargic otherwise responsive, poor historian No signs of significant encephalopathy  Requested for patient to be admitted for failure to thrive, elevated creatinine, elevated lithium level

## 2022-10-22 NOTE — Assessment & Plan Note (Signed)
Repleting potassium orally Checking magnesium

## 2022-10-22 NOTE — Progress Notes (Signed)
Nutrition Brief Note  RD received calorie count consult and diet education.   Nursing and nutrition services aware and also informed patient.   Will start today at dinner through Wednesday dinner. Results and full assessment to follow.    Wt Readings from Last 15 Encounters:  03/16/22 54.4 kg  06/04/20 61.2 kg  06/03/20 61.2 kg  10/19/19 70.3 kg  05/09/18 58.1 kg  03/24/18 62.1 kg  03/07/18 62.1 kg  02/27/18 62.1 kg  01/28/18 62.1 kg  11/30/17 59 kg  11/05/17 90.7 kg  10/05/17 59.2 kg  09/14/17 59.4 kg  09/13/17 52.2 kg  08/10/17 52.2 kg    There is no height or weight on file to calculate BMI. Patient meets criteria for underweight based on current BMI.   Current diet order is regular, unknown intake of meals at this time.   Labs and medications reviewed.     Latest Ref Rng & Units 10/22/2022    9:27 AM 03/16/2022   10:59 AM 06/04/2020   10:46 PM  BMP  Glucose 70 - 99 mg/dL 97  118  103   BUN 8 - 23 mg/dL '17  19  18   '$ Creatinine 0.61 - 1.24 mg/dL 1.97  1.83  1.37   Sodium 135 - 145 mmol/L 138  140  139   Potassium 3.5 - 5.1 mmol/L 3.3  2.8  3.3   Chloride 98 - 111 mmol/L 104  108  104   CO2 22 - 32 mmol/L '27  27  27   '$ Calcium 8.9 - 10.3 mg/dL 9.0  9.1  9.1       Colman Cater MS,RD,CSG,LDN Contact:

## 2022-10-22 NOTE — ED Triage Notes (Signed)
Pt arrived via [facility] c/o generalized weakness since the weekend that progressively worse lastnight. Also per EMS, facility states that pt has had a change in appetite as he has lost 20 pounds in 30 days, wears oxygen prn at home, Hx of COPD, enlarged heart. Also per EMS, stroke screen negative

## 2022-10-22 NOTE — Assessment & Plan Note (Signed)
-   Continue B12 supplements -H&H stable

## 2022-10-23 DIAGNOSIS — R627 Adult failure to thrive: Secondary | ICD-10-CM | POA: Diagnosis not present

## 2022-10-23 LAB — COMPREHENSIVE METABOLIC PANEL
ALT: 10 U/L (ref 0–44)
AST: 13 U/L — ABNORMAL LOW (ref 15–41)
Albumin: 2.8 g/dL — ABNORMAL LOW (ref 3.5–5.0)
Alkaline Phosphatase: 54 U/L (ref 38–126)
Anion gap: 6 (ref 5–15)
BUN: 14 mg/dL (ref 8–23)
CO2: 27 mmol/L (ref 22–32)
Calcium: 9.2 mg/dL (ref 8.9–10.3)
Chloride: 110 mmol/L (ref 98–111)
Creatinine, Ser: 1.66 mg/dL — ABNORMAL HIGH (ref 0.61–1.24)
GFR, Estimated: 45 mL/min — ABNORMAL LOW (ref >60)
Glucose, Bld: 98 mg/dL (ref 70–99)
Potassium: 3.9 mmol/L (ref 3.5–5.1)
Sodium: 143 mmol/L (ref 135–145)
Total Bilirubin: 0.6 mg/dL (ref 0.3–1.2)
Total Protein: 5.8 g/dL — ABNORMAL LOW (ref 6.5–8.1)

## 2022-10-23 LAB — CBC
HCT: 39.5 % (ref 39.0–52.0)
Hemoglobin: 12.3 g/dL — ABNORMAL LOW (ref 13.0–17.0)
MCH: 31.8 pg (ref 26.0–34.0)
MCHC: 31.1 g/dL (ref 30.0–36.0)
MCV: 102.1 fL — ABNORMAL HIGH (ref 80.0–100.0)
Platelets: 289 10*3/uL (ref 150–400)
RBC: 3.87 MIL/uL — ABNORMAL LOW (ref 4.22–5.81)
RDW: 14.6 % (ref 11.5–15.5)
WBC: 8.1 10*3/uL (ref 4.0–10.5)
nRBC: 0 % (ref 0.0–0.2)

## 2022-10-23 LAB — APTT: aPTT: 32 seconds (ref 24–36)

## 2022-10-23 LAB — HIV ANTIBODY (ROUTINE TESTING W REFLEX): HIV Screen 4th Generation wRfx: NONREACTIVE

## 2022-10-23 LAB — GLUCOSE, CAPILLARY: Glucose-Capillary: 130 mg/dL — ABNORMAL HIGH (ref 70–99)

## 2022-10-23 LAB — LITHIUM LEVEL: Lithium Lvl: 0.99 mmol/L (ref 0.60–1.20)

## 2022-10-23 MED ORDER — LITHIUM CARBONATE ER 300 MG PO TBCR: 300.0000 mg | EXTENDED_RELEASE_TABLET | Freq: Two times a day (BID) | ORAL | 0 refills | Status: DC

## 2022-10-23 MED ORDER — ENSURE ENLIVE PO LIQD
237.0000 mL | Freq: Three times a day (TID) | ORAL | Status: DC
  Administered 2022-10-23: 237 mL via ORAL

## 2022-10-23 MED ORDER — ENSURE ENLIVE PO LIQD: 237.0000 mL | Freq: Two times a day (BID) | ORAL | Status: DC

## 2022-10-23 MED ORDER — ENSURE ENLIVE PO LIQD: 237.0000 mL | Freq: Three times a day (TID) | ORAL | 12 refills | Status: DC

## 2022-10-23 MED ORDER — MEGESTROL ACETATE 400 MG/10ML PO SUSP: 400.0000 mg | Freq: Two times a day (BID) | ORAL | 0 refills | Status: AC

## 2022-10-23 MED ORDER — LITHIUM CARBONATE 300 MG PO TABS: 300.0000 mg | ORAL_TABLET | Freq: Every day | ORAL | 1 refills | Status: DC

## 2022-10-23 NOTE — Progress Notes (Signed)
Pt slept through the night. SP02 low at 79, oxygen turned up 2 3L/min. SP02 92. BP elevated.

## 2022-10-23 NOTE — NC FL2 (Signed)
[place] LEVEL OF CARE FORM     IDENTIFICATION  Patient Name: Ray Jackson Birthdate: 10/08/1955 Sex: male Admission Date (Current Location): 10/22/2022  [facility] and [place] Number:  [employer] and Address:  [facility] [address], [place]      Provider Number: [id number]  Attending Physician Name and Address:  Deatra James, MD  Relative Name and Phone Number:  Gar Gibbon (Sister) [phone]    Current Level of Care: Hospital Recommended Level of Care: [facility] Prior Approval Number:    Date Approved/Denied:   PASRR Number:    Discharge Plan: Domiciliary (Rest home) (([facility] [address] [place] [address]))    Current Diagnoses: Patient Active Problem List   Diagnosis Date Noted   Failure to thrive (child) 10/22/2022   Lithium toxicity 10/22/2022   FTT (failure to thrive) in adult 10/22/2022   COPD (chronic obstructive pulmonary disease) ([place]) 01/04/2021   Stage 3a chronic kidney disease ([place]) 12/10/2019   Hypokalemia 05/09/2018   HTN (hypertension) 05/09/2018   Schizophrenia ([place]) 11/05/2017   Anemia 10/02/2017   Vitamin B12 deficiency (dietary) anemia 09/01/2017   BPH with urinary obstruction 08/26/2017   Rectal abnormality 08/26/2017   Urinary retention 08/26/2017   Moderate protein-calorie malnutrition ([place]) 08/16/2017    Orientation RESPIRATION BLADDER Height & Weight     Self, Time, Place  O2 (oxygen at night and PRN) Continent Weight: 61.7 kg Height:     BEHAVIORAL SYMPTOMS/MOOD NEUROLOGICAL BOWEL NUTRITION STATUS      Continent Diet (DC summary)  AMBULATORY STATUS COMMUNICATION OF NEEDS Skin   Limited Assist Verbally Normal                       Personal Care Assistance Level of Assistance  Bathing, Feeding, Dressing Bathing Assistance: Limited assistance Feeding assistance: Independent Dressing Assistance: Limited assistance     Functional  Limitations Info  Sight, Hearing, Speech Sight Info: Impaired Hearing Info: Adequate Speech Info: Adequate    SPECIAL CARE FACTORS FREQUENCY  PT (By licensed PT)     PT Frequency: 5 times a week              Contractures Contractures Info: Not present    Additional Factors Info  Code Status, Allergies Code Status Info: Full Allergies Info: NKDA           Current Medications (10/23/2022):  This is the current hospital active medication list Current Facility-Administered Medications  Medication Dose Route Frequency Provider Last Rate Last Admin   0.9 %  sodium chloride infusion  250 mL Intravenous PRN Shahmehdi, Seyed A, MD       acetaminophen (TYLENOL) tablet 650 mg  650 mg Oral Q6H PRN Shahmehdi, Seyed A, MD       Or   acetaminophen (TYLENOL) suppository 650 mg  650 mg Rectal Q6H PRN Shahmehdi, Seyed A, MD       bisacodyl (DULCOLAX) EC tablet 5 mg  5 mg Oral Daily PRN Shahmehdi, Seyed A, MD       cholecalciferol (VITAMIN D3) 25 MCG (1000 UNIT) tablet 2,000 Units  2,000 Units Oral Daily Skipper Cliche A, MD   2,000 Units at 10/23/22 [id number]   cyanocobalamin (VITAMIN B12) tablet 1,000 mcg  1,000 mcg Oral Daily Shahmehdi, Seyed A, MD   1,000 mcg at 10/23/22 0937   feeding supplement (ENSURE ENLIVE / ENSURE PLUS) liquid 237 mL  237 mL Oral TID BM  Deatra James, MD       finasteride (PROSCAR) tablet 5 mg  5 mg Oral Daily Shahmehdi, Seyed A, MD   5 mg at 10/23/22 0937   FLUoxetine (PROZAC) capsule 20 mg  20 mg Oral Daily Shahmehdi, Seyed A, MD   20 mg at 10/23/22 0937   heparin injection 5,000 Units  5,000 Units Subcutaneous Q8H Shahmehdi, Erling Conte A, MD   5,000 Units at 10/23/22 0559   hydrALAZINE (APRESOLINE) injection 10 mg  10 mg Intravenous Q4H PRN Shahmehdi, Seyed A, MD       hydrALAZINE (APRESOLINE) tablet 50 mg  50 mg Oral TID Skipper Cliche A, MD   50 mg at 10/23/22 0941   HYDROmorphone (DILAUDID) injection 0.5-1 mg  0.5-1 mg Intravenous Q2H PRN Shahmehdi, Seyed A, MD        ipratropium (ATROVENT) nebulizer solution 0.5 mg  0.5 mg Nebulization Q6H PRN Shahmehdi, Seyed A, MD       isosorbide mononitrate (IMDUR) 24 hr tablet 30 mg  30 mg Oral Daily Shahmehdi, Seyed A, MD   30 mg at 10/23/22 0937   lactulose (CHRONULAC) 10 GM/15ML solution 20 g  20 g Oral Daily Shahmehdi, Seyed A, MD   20 g at 10/23/22 [id number]   levalbuterol (XOPENEX) nebulizer solution 0.63 mg  0.63 mg Nebulization Q6H PRN Shahmehdi, Seyed A, MD       mirtazapine (REMERON) tablet 30 mg  30 mg Oral QHS Shahmehdi, Seyed A, MD   30 mg at 10/22/22 2243   nicotine (NICODERM CQ - dosed in mg/24 hr) patch 7 mg  7 mg Transdermal Daily Shahmehdi, Seyed A, MD   7 mg at 10/23/22 0939   OLANZapine (ZYPREXA) tablet 5 mg  5 mg Oral QHS Shahmehdi, Seyed A, MD   5 mg at 10/22/22 2243   ondansetron (ZOFRAN) tablet 4 mg  4 mg Oral Q6H PRN Shahmehdi, Seyed A, MD       Or   ondansetron (ZOFRAN) injection 4 mg  4 mg Intravenous Q6H PRN Shahmehdi, Seyed A, MD       oxyCODONE (Oxy IR/ROXICODONE) immediate release tablet 5 mg  5 mg Oral Q4H PRN Shahmehdi, Seyed A, MD       pneumococcal 20-valent conjugate vaccine (PREVNAR 20) injection 0.5 mL  0.5 mL Intramuscular Tomorrow-1000 Shahmehdi, Seyed A, MD       senna-docusate (Senokot-S) tablet 1 tablet  1 tablet Oral Daily Shahmehdi, Seyed A, MD   1 tablet at 10/23/22 0937   sodium chloride flush (NS) 0.9 % injection 3 mL  3 mL Intravenous Q12H Shahmehdi, Seyed A, MD   3 mL at 10/22/22 2244   sodium chloride flush (NS) 0.9 % injection 3 mL  3 mL Intravenous Q12H Shahmehdi, Seyed A, MD   3 mL at 10/23/22 0939   sodium chloride flush (NS) 0.9 % injection 3 mL  3 mL Intravenous PRN Shahmehdi, Seyed A, MD       sodium phosphate (FLEET) 7-19 GM/118ML enema 1 enema  1 enema Rectal Once PRN Shahmehdi, Seyed A, MD       tamsulosin (FLOMAX) capsule 0.4 mg  0.4 mg Oral Daily Shahmehdi, Seyed A, MD   0.4 mg at 10/23/22 0937   traZODone (DESYREL) tablet 50 mg  50 mg Oral QHS PRN Shahmehdi,  Seyed A, MD       umeclidinium bromide (INCRUSE ELLIPTA) 62.5 MCG/ACT 1 puff  1 puff Inhalation Daily Shahmehdi, Seyed A, MD   1 puff at 10/23/22  0756     Discharge Medications:   Medication List       STOP taking these medications     lithium carbonate 450 MG ER tablet Commonly known as: ESKALITH Replaced by: lithium 300 MG tablet           TAKE these medications     acetaminophen 325 MG tablet Commonly known as: TYLENOL Take 2 tablets (650 mg total) by mouth every 6 (six) hours as needed for mild pain (or Fever >/= 101).    albuterol (2.5 MG/3ML) 0.083% nebulizer solution Commonly known as: PROVENTIL Take 2.5 mg by nebulization every 6 (six) hours as needed for wheezing or shortness of breath.    amLODipine 5 MG tablet Commonly known as: NORVASC Take 1 tablet (5 mg total) by mouth daily.    cetirizine 10 MG tablet Commonly known as: ZYRTEC Take 10 mg by mouth daily.    clonazePAM 1 MG tablet Commonly known as: KLONOPIN Take 1 mg by mouth at bedtime.    cyanocobalamin 1000 MCG tablet Commonly known as: VITAMIN B12 Take 1,000 mcg by mouth daily.    diclofenac 50 MG EC tablet Commonly known as: VOLTAREN Take 50 mg by mouth daily as needed for mild pain.    feeding supplement Liqd Take 237 mLs by mouth 3 (three) times daily between meals.    finasteride 5 MG tablet Commonly known as: PROSCAR Take 5 mg by mouth daily.    FLUoxetine 20 MG capsule Commonly known as: PROZAC Take 20 mg by mouth daily.    fluticasone 50 MCG/ACT nasal spray Commonly known as: FLONASE Place 2 sprays into both nostrils daily.    hydrALAZINE 50 MG tablet Commonly known as: APRESOLINE Take 1 tablet (50 mg total) by mouth 3 (three) times daily.    Invega Trinza 546 MG/1.75ML injection Generic drug: Paliperidone Palmitate ER Inject 546 mg into the muscle See admin instructions. Every 3 months    isosorbide mononitrate 30 MG 24 hr tablet Commonly known as: IMDUR Take 1  tablet (30 mg total) by mouth daily.    lithium 300 MG tablet Take 1 tablet (300 mg total) by mouth daily. Replaces: lithium carbonate 450 MG ER tablet    megestrol 400 MG/10ML suspension Commonly known as: MEGACE Take 10 mLs (400 mg total) by mouth 2 (two) times daily.    mirtazapine 30 MG tablet Commonly known as: REMERON Take 30 mg by mouth at bedtime.    OLANZapine 5 MG tablet Commonly known as: ZYPREXA Take 5 mg by mouth at bedtime.    oxybutynin 5 MG tablet Commonly known as: DITROPAN Take 5 mg by mouth 2 (two) times daily.    senna-docusate 8.6-50 MG tablet Commonly known as: Senokot-S Take 1 tablet by mouth daily.    tamsulosin 0.4 MG Caps capsule Commonly known as: FLOMAX Take 0.4 mg by mouth daily.    tiotropium 18 MCG inhalation capsule Commonly known as: SPIRIVA Place 18 mcg into inhaler and inhale daily.    traZODone 50 MG tablet Commonly known as: DESYREL Take 1 tablet (50 mg total) by mouth at bedtime.    Vitamin D3 50 MCG (2000 UT) Caps Generic drug: Cholecalciferol Take 2,000 Units by mouth daily.       Relevant Imaging Results:  Relevant Lab Results:   Additional Information SSN: [id number]  Boneta Lucks, RN

## 2022-10-23 NOTE — Progress Notes (Addendum)
Patient discharged to [facility] today, transported by there transportation services. Discharge paperwork went over with patient. Patients legal guardian Gar Gibbon made aware of patients discharge. Belongings sent with patient. Patient ambulated with PT/OT in the hallway. Patient on room air, oxygen saturation was holding at 96% on Room air.

## 2022-10-23 NOTE — Plan of Care (Signed)
  Problem: Acute Rehab OT Goals (only OT should resolve) Goal: Pt. Will Perform Grooming Flowsheets (Taken 10/23/2022 0943) Pt Will Perform Grooming:  with modified independence  standing Goal: Pt. Will Transfer To Toilet Bode (Taken 10/23/2022 [phone]) Pt Will Transfer to Toilet:  with modified independence  ambulating Goal: Pt/Caregiver Will Perform Home Exercise Program Flowsheets (Taken 10/23/2022 [phone]) Pt/caregiver will Perform Home Exercise Program:  Increased strength  Both right and left upper extremity  Independently  [place]i Blyden OT, MOT

## 2022-10-23 NOTE — TOC Transition Note (Signed)
Transition of Care [facility]) - CM/SW Discharge Note   Patient Details  Name: Ray Jackson MRN: [id number] Date of Birth: 1956-08-01  [name]) CM/SW Contact:  Boneta Lucks, RN Phone Number: 10/23/2022, 11:50 AM   Clinical Narrative:   Patient discharging back to [name] hands. PT is recommending HHPT with rolling walker. CM spoke with facility and [facility], sister/Legal guardian. Fl2 completed and fax to [name]. They will provide transportation. RN updated. Referral sent to [employer] for rolling walker, it will be shipped.  Referral sent to [name] for HHPT.     Final next level of care: Other (comment) (Group home.) Barriers to Discharge: Barriers Resolved   Patient Goals and CMS Choice CMS Medicare.gov Compare Post Acute Care list provided to:: Patient Represenative (must comment) Choice offered to / list presented to : [name] POA / [place]  Discharge Placement                 Patient to be transferred to facility by: [name] Name of family member notified: [name] Patient and family notified of of transfer: 10/23/22  Discharge Plan and Services Additional resources added to the After Visit Summary for                 DME Arranged: Walker rolling DME Agency: [name] Date DME Agency Contacted: 10/23/22 Time DME Agency Contacted: [id number] Representative spoke with at DME Agency: Erasmo Downer HH Arranged: PT [place]: [name] Date [place]: 10/23/22 Time [place]: [id number] Representative spoke with at [place]: [name] Interventions SDOH Screenings   Alcohol Screen: Medium Risk (11/05/2017)  Tobacco Use: High Risk (10/22/2022)     Readmission Risk Interventions    10/23/2022   11:50 AM  Readmission Risk Prevention Plan  Transportation Screening Complete  PCP or Specialist Appt within 5-7 Days Not Complete  Home Care Screening Complete  Medication Review (RN CM)  Complete

## 2022-10-23 NOTE — Evaluation (Signed)
Occupational Therapy Evaluation Patient Details Name: Ray Jackson MRN: [id number] DOB: 12-11-1955 Today's Date: 10/23/2022   History of Present Illness Ray Jackson is a 67 year old male SNF with past medical history of schizophrenia, HTN, BPH, CKD, COPD, chronic anemia with B12 deficiency...  Presented a history of poor appetite weakness and weight loss.  Appears to have been progressively getting worse over past few days, denies have having asymmetric weaknesses numbness tingling patient is a poor historian facility reports about 20 pound weight loss, poor appetite over past week.     Denies of having any recent illnesses, denies of any nausea or vomiting or hematochezia denies any dysuria.  Denies any pain. (per MD)   Clinical Impression   Pt agreeable to OT and PT co-evaluation. Pt removed from supplemental O2 after reporting he uses oxygen at night and PRN during the day. Pt mostly at 93% SpO2 during ambulation and session tasks. Min G assist needed when standing and ambulating. Generally weak but able to complete seated ADL's with set up assist. Pt will benefit from continued OT in the hospital and recommended venue below to increase strength, balance, and endurance for safe ADL's.         Recommendations for follow up therapy are one component of a multi-disciplinary discharge planning process, led by the attending physician.  Recommendations may be updated based on patient status, additional functional criteria and insurance authorization.   Follow Up Recommendations  Home health OT     Assistance Recommended at Discharge Intermittent Supervision/Assistance  Patient can return home with the following A little help with walking and/or transfers;A little help with bathing/dressing/bathroom;Assistance with cooking/housework;Assist for transportation;Help with stairs or ramp for entrance    Functional Status Assessment  Patient has had a recent decline in their functional status and  demonstrates the ability to make significant improvements in function in a reasonable and predictable amount of time.  Equipment Recommendations  None recommended by OT    Recommendations for Other Services       Precautions / Restrictions Precautions Precautions: Fall Restrictions Weight Bearing Restrictions: No      Mobility Bed Mobility Overal bed mobility: Modified Independent                  Transfers Overall transfer level: Needs assistance Equipment used: Rolling walker (2 wheels) Transfers: Sit to/from Stand, Bed to chair/wheelchair/BSC Sit to Stand: Supervision, Min guard     Step pivot transfers: Min guard     General transfer comment: Labored movement. Use of RW. Unsteady without RW.      Balance Overall balance assessment: Needs assistance Sitting-balance support: Feet supported, No upper extremity supported Sitting balance-Leahy Scale: Good Sitting balance - Comments: seated EOB   Standing balance support: Bilateral upper extremity supported, During functional activity Standing balance-Leahy Scale: Fair Standing balance comment: using RW                           ADL either performed or assessed with clinical judgement   ADL Overall ADL's : Needs assistance/impaired     Grooming: Supervision/safety;Min guard;Standing   Upper Body Bathing: Sitting;Set up   Lower Body Bathing: Set up;Sitting/lateral leans   Upper Body Dressing : Set up;Sitting   Lower Body Dressing: Set up;Modified independent;Sitting/lateral leans Lower Body Dressing Details (indicate cue type and reason): Able to doff and don socks without physical assist. Toilet Transfer: Min guard;Ambulation;Rolling walker (2 wheels) Toilet Transfer Details (indicate cue type and reason):  Simulated via ambulation in room from chair. Toileting- Clothing Manipulation and Hygiene: Modified independent;Set up;Sitting/lateral lean       Functional mobility during ADLs: Min  guard;Rolling walker (2 wheels)       Vision Baseline Vision/History: 0 No visual deficits (history of cataracts removal) Ability to See in Adequate Light: 0 Adequate Patient Visual Report: No change from baseline Vision Assessment?: No apparent visual deficits                Pertinent Vitals/Pain Pain Assessment Pain Assessment: No/denies pain     Hand Dominance Left   Extremity/Trunk Assessment Upper Extremity Assessment Upper Extremity Assessment: Generalized weakness   Lower Extremity Assessment Lower Extremity Assessment: Defer to PT evaluation   Cervical / Trunk Assessment Cervical / Trunk Assessment: Normal   Communication Communication Communication: No difficulties   Cognition Arousal/Alertness: Awake/alert Behavior During Therapy: WFL for tasks assessed/performed Overall Cognitive Status: Within Functional Limits for tasks assessed                                                        Home Living Family/patient expects to be discharged to:: Group home Living Arrangements: Group Home Available Help at Discharge: Other (Comment) (group home staff) Type of Home: Group Home Home Access: Ramped entrance     Home Layout: One level     Bathroom Shower/Tub: Tub/shower unit;Walk-in shower   Bathroom Toilet:  (pt seemed unsure)     Home Equipment: Cane - quad   Additional Comments: Pt reports being from group home.      Prior Functioning/Environment Prior Level of Function : Needs assist       Physical Assist : Mobility (physical);ADLs (physical)   ADLs (physical): IADLs;Bathing;Dressing Mobility Comments: Pt reports houshold ambulation without AD. ADLs Comments: Pt reports set up assist for bathing and dressing. Assisted for IADL's.        OT Problem List: Decreased strength;Decreased activity tolerance;Impaired balance (sitting and/or standing)      OT Treatment/Interventions: Self-care/ADL training;Therapeutic  exercise;Therapeutic activities;Balance training;Patient/family education    OT Goals(Current goals can be found in the care plan section) Acute Rehab OT Goals Patient Stated Goal: return to group home with home health OT. OT Goal Formulation: With patient Time For Goal Achievement: 11/06/22 Potential to Achieve Goals: Good  OT Frequency: Min 1X/week    Co-evaluation PT/OT/SLP Co-Evaluation/Treatment: Yes Reason for Co-Treatment: To address functional/ADL transfers   OT goals addressed during session: ADL's and self-care                       End of Session Equipment Utilized During Treatment: Rolling walker (2 wheels)  Activity Tolerance: Patient tolerated treatment well Patient left: in chair;with call bell/phone within reach  OT Visit Diagnosis: Unsteadiness on feet (R26.81);Other abnormalities of gait and mobility (R26.89);Muscle weakness (generalized) (M62.81);Adult, failure to thrive (R62.7)                Time: 0822-0843 OT Time Calculation (min): 21 min Charges:  OT General Charges $OT Visit: 1 Visit OT Evaluation $OT Eval Low Complexity: 1 Low  Ray Jackson OT, MOT  Ray Jackson 10/23/2022, 9:41 AM

## 2022-10-23 NOTE — Care Management Obs Status (Signed)
MEDICARE OBSERVATION STATUS NOTIFICATION   Patient Details  Name: Rohit Cisney MRN: [id number] Date of Birth: [birth year]   Medicare Observation Status Notification Given:  Yes    Boneta Lucks, RN 10/23/2022, 11:17 AM

## 2022-10-23 NOTE — Care Management CC44 (Signed)
Condition Code 44 Documentation Completed  Patient Details  Name: Loyde Rzepecki MRN: [id number] Date of Birth: March 05, 1956   Condition Code 44 given:  Yes Patient signature on Condition Code 44 notice:  Yes Documentation of 2 MD's agreement:  Yes Code 44 added to claim:  Yes    Boneta Lucks, RN 10/23/2022, 11:17 AM

## 2022-10-23 NOTE — Progress Notes (Signed)
Upon rounds, patient up in chair without oxygen on. Oxygen at 97% on room air-- will leave patient on room air at this time.

## 2022-10-23 NOTE — Evaluation (Addendum)
Physical Therapy Evaluation Patient Details Name: Alandis Saka MRN: [id number] DOB: October 01, 1955 Today's Date: 10/23/2022  History of Present Illness  Ray Jackson is a 67 year old male SNF with past medical history of schizophrenia, HTN, BPH, CKD, COPD, chronic anemia with B12 deficiency...  Presented a history of poor appetite weakness and weight loss.  Appears to have been progressively getting worse over past few days, denies have having asymmetric weaknesses numbness tingling patient is a poor historian facility reports about 20 pound weight loss, poor appetite over past week.     Denies of having any recent illnesses, denies of any nausea or vomiting or hematochezia denies any dysuria.  Denies any pain.   Clinical Impression  Patient demonstrates good return for sitting up at bedside, has to lean on nearby objects for support when transferring taking steps without use of an AD, safer using RW with good return for use demonstrated.   Patient ambulated in hallway on room air with SpO2 at 95% and tolerated sitting up in chair after therapy - nursing staff aware.  PLAN:  Patient to be discharged home today and discharged from acute physical therapy to care of nursing for ambulation as tolerated for length of stay with recommendations stated below        Recommendations for follow up therapy are one component of a multi-disciplinary discharge planning process, led by the attending physician.  Recommendations may be updated based on patient status, additional functional criteria and insurance authorization.  Follow Up Recommendations Home health PT      Assistance Recommended at Discharge Set up Supervision/Assistance  Patient can return home with the following  A little help with walking and/or transfers;A little help with bathing/dressing/bathroom;Help with stairs or ramp for entrance;Assistance with cooking/housework    Equipment Recommendations Rolling walker (2 wheels)  Recommendations for  Other Services       Functional Status Assessment Patient has had a recent decline in their functional status and demonstrates the ability to make significant improvements in function in a reasonable and predictable amount of time.     Precautions / Restrictions Precautions Precautions: Fall Restrictions Weight Bearing Restrictions: No      Mobility  Bed Mobility Overal bed mobility: Modified Independent                  Transfers Overall transfer level: Needs assistance Equipment used: Rolling walker (2 wheels) Transfers: Sit to/from Stand, Bed to chair/wheelchair/BSC Sit to Stand: Supervision, Min guard   Step pivot transfers: Min guard       General transfer comment: had to lean on armrest of chair during transfer without AD, safer using RW    Ambulation/Gait Ambulation/Gait assistance: Min guard, Supervision Gait Distance (Feet): 55 Feet Assistive device: Rolling walker (2 wheels) Gait Pattern/deviations: Decreased step length - right, Decreased step length - left, Decreased stride length, Trunk flexed Gait velocity: decreased     General Gait Details: slightly labored cadence without loss of balance with good return for using RW and limited mostly due to fatigue  Stairs            Wheelchair Mobility    Modified Rankin (Stroke Patients Only)       Balance Overall balance assessment: Needs assistance Sitting-balance support: Feet supported, No upper extremity supported Sitting balance-Leahy Scale: Good Sitting balance - Comments: seated EOB   Standing balance support: During functional activity, No upper extremity supported Standing balance-Leahy Scale: Poor Standing balance comment: fair/poor without AD, fair/good using RW  Pertinent Vitals/Pain Pain Assessment Pain Assessment: No/denies pain    Home Living Family/patient expects to be discharged to:: Group home Living Arrangements: Group  Home Available Help at Discharge: Other (Comment) (group home staff) Type of Home: Group Home Home Access: Ramped entrance       Home Layout: One level Home Equipment: Cane - quad Additional Comments: Pt reports being from group home.    Prior Function Prior Level of Function : Needs assist       Physical Assist : Mobility (physical);ADLs (physical) Mobility (physical): Bed mobility;Transfers;Gait;Stairs ADLs (physical): IADLs;Bathing;Dressing Mobility Comments: household ambulator without AD ADLs Comments: Pt reports set up assist for bathing and dressing. Assisted for IADL's.     Hand Dominance   Dominant Hand: Left    Extremity/Trunk Assessment   Upper Extremity Assessment Upper Extremity Assessment: Defer to OT evaluation    Lower Extremity Assessment Lower Extremity Assessment: Generalized weakness    Cervical / Trunk Assessment Cervical / Trunk Assessment: Normal  Communication   Communication: No difficulties  Cognition Arousal/Alertness: Awake/alert Behavior During Therapy: WFL for tasks assessed/performed Overall Cognitive Status: Within Functional Limits for tasks assessed                                          General Comments      Exercises     Assessment/Plan    PT Assessment All further PT needs can be met in the next venue of care  PT Problem List Decreased strength;Decreased activity tolerance;Decreased balance;Decreased mobility       PT Treatment Interventions      PT Goals (Current goals can be found in the Care Plan section)  Acute Rehab PT Goals Patient Stated Goal: return home with group home staff to assist PT Goal Formulation: With patient Time For Goal Achievement: 10/23/22 Potential to Achieve Goals: Good    Frequency       Co-evaluation PT/OT/SLP Co-Evaluation/Treatment: Yes Reason for Co-Treatment: To address functional/ADL transfers PT goals addressed during session: Mobility/safety with  mobility;Balance;Proper use of DME OT goals addressed during session: ADL's and self-care       AM-PAC PT "6 Clicks" Mobility  Outcome Measure Help needed turning from your back to your side while in a flat bed without using bedrails?: None Help needed moving from lying on your back to sitting on the side of a flat bed without using bedrails?: None Help needed moving to and from a bed to a chair (including a wheelchair)?: A Little Help needed standing up from a chair using your arms (e.g., wheelchair or bedside chair)?: A Little Help needed to walk in hospital room?: A Little Help needed climbing 3-5 steps with a railing? : A Lot 6 Click Score: 19    End of Session   Activity Tolerance: Patient tolerated treatment well;Patient limited by fatigue Patient left: in chair;with call bell/phone within reach Nurse Communication: Mobility status PT Visit Diagnosis: Unsteadiness on feet (R26.81);Other abnormalities of gait and mobility (R26.89);Muscle weakness (generalized) (M62.81)    Time: [id number] PT Time Calculation (min) (ACUTE ONLY): 29 min   Charges:   PT Evaluation $PT Eval Moderate Complexity: 1 Mod PT Treatments $Therapeutic Activity: 23-37 mins        11:34 AM, 10/23/22 Lonell Grandchild, MPT Physical Therapist with [facility] [phone] office [phone] mobile phone

## 2022-10-23 NOTE — Discharge Summary (Addendum)
Physician Discharge Summary   Patient: Ray Jackson MRN: [id number] DOB: 1956/04/23  Admit date:     10/22/2022  Discharge date: 10/23/22  Discharge Physician: Deatra James   PCP: Lauretta Grill, NP   Recommendations at discharge:  Follow-up with psychiatrist within 1 week Team level to be checked in 3 days,  BMP with lithium level q. weekly x 4--- results to PCP and psychiatrist  Discharge Diagnoses: Principal Problem:   Failure to thrive (child) Active Problems:   Lithium toxicity   Anemia   Schizophrenia ([place])   Hypokalemia   HTN (hypertension)   BPH with urinary obstruction   COPD (chronic obstructive pulmonary disease) (HCC)   Moderate protein-calorie malnutrition (HCC)   Stage 3a chronic kidney disease ([place])   Vitamin B12 deficiency (dietary) anemia   FTT (failure to thrive) in adult  Resolved Problems:   * No resolved hospital problems. *  Hospital Course: Ray Jackson is a 67 year old male SNF with past medical history of schizophrenia, HTN, BPH, CKD, COPD, chronic anemia with B12 deficiency... Presented a history of poor appetite weakness and weight loss. Appears to have been progressively getting worse over past few days, denies have having asymmetric weaknesses numbness tingling patient is a poor historian facility reports about 20 pound weight loss, poor appetite over past week.  Denies of having any recent illnesses, denies of any nausea or vomiting or hematochezia denies any dysuria.  Denies any pain.    ED course: Blood pressure 122/80, pulse 63, temperature 98.1 F (36.7 C), Oral, resp. rate 16, SpO2 97 %. CBC CMP reviewed, WBC 9.1, hemoglobin 11.9, sodium 138, potassium 3.3, creatinine 1.97, BNP 118, troponin 5, lithium elevated at 1.5 (normal 0.6-1.2)  Chest x-ray within normal notes, no changes in EKG Patient is mild lethargic otherwise responsive, poor historian No signs of significant encephalopathy  Requested for patient to be admitted for  failure to thrive, elevated creatinine, elevated lithium level   Assessment and Plan: * Failure to thrive (child) -Patient presenting from SNF for poor appetite, weight loss --Mild dehydration --Status post fluid resuscitation -Consulting nutrition -Dietary supplements -Supplement dyspnea and added appetite stimulant -Megace  Lithium toxicity -Serum (M level 1.5 (0.6-1.2)>>> 0.99 Mildly elevated, --PM, continue with reduced dose from 450>>> 300 mg p.o. daily -Continue IV fluids, monitoring levels   Vitamin B12 deficiency (dietary) anemia - Continue B12 supplements -H&H stable  Stage 3a chronic kidney disease (HCC) - Elevated creatinine 1.97>>> 1.66 Baseline creatinine 1.26-1.83 -Status post gentle IV fluid hydration, avoid nephrotoxins  Moderate protein-calorie malnutrition ([place]) -Nutrition consult, calorie count, dietary supplement Body mass index is 18.45 kg/m.  COPD (chronic obstructive pulmonary disease) (HCC) -Currently stable, no signs of exacerbation -supplemental oxygen as needed -No signs of exacerbation DuoNeb bronchodilators  BPH with urinary obstruction -Continue home medication of Flomax and finasteride  HTN (hypertension) - Stable, reviewing home medication, and resuming Imdur, Norvasc, holding hydralazine for now -As needed IV hydralazine  Hypokalemia Repleting potassium orally Checking magnesium  Schizophrenia ([place]) - Currently stable, resuming home medication of fluoxetine, were holding holding lithium as elevated levels -Continue Remeron, Zyprexa, -On monthly injection of IM Invega  Anemia Anemia of chronic disease including B12 deficiency, H&H stable    Disposition: Skilled nursing facility Diet recommendation:  Discharge Diet Orders (From admission, onward)     Start     Ordered   10/23/22 0000  Diet - low sodium heart healthy        10/23/22 0820  Regular diet DISCHARGE MEDICATION: Allergies as of 10/23/2022   No  Known Allergies      Medication List     TAKE these medications    acetaminophen 325 MG tablet Commonly known as: TYLENOL Take 2 tablets (650 mg total) by mouth every 6 (six) hours as needed for mild pain (or Fever >/= 101).   albuterol (2.5 MG/3ML) 0.083% nebulizer solution Commonly known as: PROVENTIL Take 2.5 mg by nebulization every 6 (six) hours as needed for wheezing or shortness of breath.   amLODipine 5 MG tablet Commonly known as: NORVASC Take 1 tablet (5 mg total) by mouth daily.   cetirizine 10 MG tablet Commonly known as: ZYRTEC Take 10 mg by mouth daily.   clonazePAM 1 MG tablet Commonly known as: KLONOPIN Take 1 mg by mouth at bedtime.   cyanocobalamin 1000 MCG tablet Commonly known as: VITAMIN B12 Take 1,000 mcg by mouth daily.   diclofenac 50 MG EC tablet Commonly known as: VOLTAREN Take 50 mg by mouth daily as needed for mild pain.   feeding supplement Liqd Take 237 mLs by mouth 3 (three) times daily between meals.   finasteride 5 MG tablet Commonly known as: PROSCAR Take 5 mg by mouth daily.   FLUoxetine 20 MG capsule Commonly known as: PROZAC Take 20 mg by mouth daily.   fluticasone 50 MCG/ACT nasal spray Commonly known as: FLONASE Place 2 sprays into both nostrils daily.   hydrALAZINE 50 MG tablet Commonly known as: APRESOLINE Take 1 tablet (50 mg total) by mouth 3 (three) times daily.   Invega Trinza 546 MG/1.75ML injection Generic drug: Paliperidone Palmitate ER Inject 546 mg into the muscle See admin instructions. Every 3 months   isosorbide mononitrate 30 MG 24 hr tablet Commonly known as: IMDUR Take 1 tablet (30 mg total) by mouth daily.   lithium carbonate 300 MG ER tablet Commonly known as: LITHOBID Take 1 tablet (300 mg total) by mouth 2 (two) times daily. What changed:  medication strength how much to take   megestrol 400 MG/10ML suspension Commonly known as: MEGACE Take 10 mLs (400 mg total) by mouth 2 (two) times  daily.   mirtazapine 30 MG tablet Commonly known as: REMERON Take 30 mg by mouth at bedtime.   OLANZapine 5 MG tablet Commonly known as: ZYPREXA Take 5 mg by mouth at bedtime.   oxybutynin 5 MG tablet Commonly known as: DITROPAN Take 5 mg by mouth 2 (two) times daily.   senna-docusate 8.6-50 MG tablet Commonly known as: Senokot-S Take 1 tablet by mouth daily.   tamsulosin 0.4 MG Caps capsule Commonly known as: FLOMAX Take 0.4 mg by mouth daily.   tiotropium 18 MCG inhalation capsule Commonly known as: SPIRIVA Place 18 mcg into inhaler and inhale daily.   traZODone 50 MG tablet Commonly known as: DESYREL Take 1 tablet (50 mg total) by mouth at bedtime.   Vitamin D3 50 MCG (2000 UT) Caps Generic drug: Cholecalciferol Take 2,000 Units by mouth daily.               Durable Medical Equipment  (From admission, onward)           Start     Ordered   10/23/22 1129  For home use only DME Walker rolling  Once       Comments: Patient unsteady on feet having to lean on nearby objects for support, safer using RW with good return for use demonstrated  Question Answer Comment  Walker: With 5  Inch Wheels   Patient needs a walker to treat with the following condition Gait difficulty      10/23/22 1129            Follow-up [place] Follow up.   Why: PT will call to schedule your first home visit. Contact information: [address] [place] [address] [phone]                Discharge Exam: Danley Danker Weights   10/23/22 0500  Weight: 61.7 kg        General:  AAO x 3,  cooperative, no distress;   HEENT:  Normocephalic, PERRL, otherwise with in Normal limits   Neuro:  CNII-XII intact. , normal motor and sensation, reflexes intact   Lungs:   Clear to auscultation BL, Respirations unlabored,  No wheezes / crackles  Cardio:    S1/S2, RRR, No murmure, No Rubs or Gallops   Abdomen:  Soft, non-tender,  bowel sounds active all four quadrants, no guarding or peritoneal signs.  Muscular  skeletal:  Limited exam -global generalized weaknesses - in bed, able to move all 4 extremities,   2+ pulses,  symmetric, No pitting edema  Skin:  Dry, warm to touch, negative for any Rashes,  Wounds: Please see nursing documentation          Condition at discharge: good  The results of significant diagnostics from this hospitalization (including imaging, microbiology, ancillary and laboratory) are listed below for reference.   Imaging Studies: DG Chest Port 1 View  Result Date: 10/22/2022 CLINICAL DATA:  Weakness EXAM: PORTABLE CHEST 1 VIEW COMPARISON:  10/19/2019 FINDINGS: Stable cardiomediastinal contours. Aortic atherosclerosis. Chronically hyperinflated lungs with coarsened interstitial markings. No focal airspace consolidation, pleural effusion, or pneumothorax. Remote right-sided rib deformities. IMPRESSION: Chronic lung changes without acute cardiopulmonary process. Electronically Signed   By: Davina Poke D.O.   On: 10/22/2022 09:54    Microbiology: Results for orders placed or performed during the hospital encounter of 03/16/22  Urine Culture     Status: Abnormal   Collection Time: 03/16/22 11:42 AM   Specimen: Urine, Clean Catch  Result Value Ref Range Status   Specimen Description   Final    URINE, CLEAN CATCH Performed at [facility], [address], [place], [place] [address]    Special Requests   Final    NONE Performed at [facility], [address], [place], [place] [address]    Culture (A)  Final    <10,000 COLONIES/mL INSIGNIFICANT GROWTH Performed at [facility] [address] [place], [place] [address]    Report Status 03/17/2022 FINAL  Final    Labs: CBC: Recent Labs  Lab 10/22/22 0927 10/23/22 0434  WBC 9.1 8.1  HGB 11.9* 12.3*  HCT 37.8* 39.5  MCV 102.2* 102.1*  PLT 274 [id number]   Basic Metabolic Panel: Recent Labs  Lab 10/22/22 0927  10/23/22 0434  NA 138 143  K 3.3* 3.9  CL 104 110  CO2 27 27  GLUCOSE 97 98  BUN 17 14  CREATININE 1.97* 1.66*  CALCIUM 9.0 9.2  MG 2.1  --   PHOS 3.0  --    Liver Function Tests: Recent Labs  Lab 10/22/22 0927 10/23/22 0434  AST 13* 13*  ALT 11 10  ALKPHOS 50 54  BILITOT 0.5 0.6  PROT 5.9* 5.8*  ALBUMIN 2.9* 2.8*   CBG: Recent Labs  Lab 10/23/22 0709  GLUCAP  130*    Discharge time spent: greater than 30 minutes.  Signed: Deatra James, MD [facility] 10/23/2022

## 2022-11-07 ENCOUNTER — Ambulatory Visit: Payer: Medicare Other | Admitting: Podiatry

## 2022-11-27 NOTE — Assessment & Plan Note (Signed)
-  Patient presenting from SNF for poor appetite, weight loss -Seems to be dehydrated -Continue with IV fluids, -Consulting nutrition -Dietary supplements -Supplement dyspnea and adding appetite stimulant 

## 2022-11-29 ENCOUNTER — Ambulatory Visit (INDEPENDENT_AMBULATORY_CARE_PROVIDER_SITE_OTHER): Payer: Medicare Other | Admitting: Podiatry

## 2022-11-29 DIAGNOSIS — Z91199 Patient's noncompliance with other medical treatment and regimen due to unspecified reason: Secondary | ICD-10-CM

## 2022-11-29 NOTE — Progress Notes (Signed)
Pt was a no show for apt, charge generated 

## 2022-12-13 ENCOUNTER — Ambulatory Visit (INDEPENDENT_AMBULATORY_CARE_PROVIDER_SITE_OTHER): Payer: Medicare Other | Admitting: Podiatry

## 2022-12-13 DIAGNOSIS — B351 Tinea unguium: Secondary | ICD-10-CM | POA: Diagnosis not present

## 2022-12-13 DIAGNOSIS — M79674 Pain in right toe(s): Secondary | ICD-10-CM

## 2022-12-13 DIAGNOSIS — M79675 Pain in left toe(s): Secondary | ICD-10-CM

## 2022-12-13 DIAGNOSIS — N1831 Chronic kidney disease, stage 3a: Secondary | ICD-10-CM

## 2022-12-13 DIAGNOSIS — F209 Schizophrenia, unspecified: Secondary | ICD-10-CM

## 2022-12-13 NOTE — Progress Notes (Signed)
  Subjective:  Patient ID: Ray Jackson, male    DOB: 1955-12-12,  MRN: [id number]  Chief Complaint  Patient presents with   Nail Problem    RFC    67 y.o. male presents with the above complaint. History confirmed with patient. Patient presenting with pain related to dystrophic thickened elongated nails. Patient is unable to trim own nails related to nail dystrophy and/or mobility issues. Patient does not have a history of T2DM.   Objective:  Physical Exam: warm, good capillary refill nail exam onychomycosis of the toenails, onycholysis, and dystrophic nails DP pulses palpable, PT pulses palpable, and protective sensation intact Left Foot:  Pain with palpation of nails due to elongation and dystrophic growth.  Right Foot: Pain with palpation of nails due to elongation and dystrophic growth.   Assessment:   1. Pain due to onychomycosis of toenails of both feet   2. Stage 3a chronic kidney disease   3. Schizophrenia, unspecified type      Plan:  Patient was evaluated and treated and all questions answered.    #Onychomycosis with pain  -Nails palliatively debrided as below. -Educated on self-care  Procedure: Nail Debridement Rationale: Pain Type of Debridement: manual, sharp debridement. Instrumentation: Nail nipper, rotary burr. Number of Nails: 10  Return in about 3 months (around 03/14/2023).         Corinna Gab, DPM [facility] / [facility]

## 2023-03-14 ENCOUNTER — Ambulatory Visit: Payer: Medicare Other | Admitting: Podiatry

## 2023-03-14 DIAGNOSIS — M79675 Pain in left toe(s): Secondary | ICD-10-CM | POA: Diagnosis not present

## 2023-03-14 DIAGNOSIS — M79674 Pain in right toe(s): Secondary | ICD-10-CM

## 2023-03-14 DIAGNOSIS — N1831 Chronic kidney disease, stage 3a: Secondary | ICD-10-CM

## 2023-03-14 DIAGNOSIS — B351 Tinea unguium: Secondary | ICD-10-CM | POA: Diagnosis not present

## 2023-03-14 NOTE — Progress Notes (Signed)
  Subjective:  Patient ID: Ray Jackson, male    DOB: 1956-04-12,  MRN: [id number]  Chief Complaint  Patient presents with   Nail Problem    Routine Foot Care-nail trim     67 y.o. male presents with the above complaint. History confirmed with patient. Patient presenting with pain related to dystrophic thickened elongated nails. Patient is unable to trim own nails related to nail dystrophy and/or mobility issues. Patient does not have a history of T2DM.   Objective:  Physical Exam: warm, good capillary refill nail exam onychomycosis of the toenails, onycholysis, and dystrophic nails DP pulses palpable, PT pulses palpable, and protective sensation intact Left Foot:  Pain with palpation of nails due to elongation and dystrophic growth.  Right Foot: Pain with palpation of nails due to elongation and dystrophic growth.   Assessment:   1. Pain due to onychomycosis of toenails of both feet   2. Stage 3a chronic kidney disease (HCC)       Plan:  Patient was evaluated and treated and all questions answered.    #Onychomycosis with pain  -Nails palliatively debrided as below. -Educated on self-care  Procedure: Nail Debridement Rationale: Pain Type of Debridement: manual, sharp debridement. Instrumentation: Nail nipper, rotary burr. Number of Nails: 10  Return in about 3 months (around 06/14/2023) for RFC.         Corinna Gab, DPM [facility] / [facility]

## 2023-06-27 ENCOUNTER — Ambulatory Visit (INDEPENDENT_AMBULATORY_CARE_PROVIDER_SITE_OTHER): Payer: Medicare Other | Admitting: Podiatry

## 2023-06-27 ENCOUNTER — Encounter: Payer: Self-pay | Admitting: Podiatry

## 2023-06-27 VITALS — Ht 72.0 in | Wt 136.0 lb

## 2023-06-27 DIAGNOSIS — B351 Tinea unguium: Secondary | ICD-10-CM | POA: Diagnosis not present

## 2023-06-27 DIAGNOSIS — M79674 Pain in right toe(s): Secondary | ICD-10-CM

## 2023-06-27 DIAGNOSIS — M79675 Pain in left toe(s): Secondary | ICD-10-CM

## 2023-06-27 DIAGNOSIS — N1831 Chronic kidney disease, stage 3a: Secondary | ICD-10-CM

## 2023-06-27 NOTE — Progress Notes (Signed)
  Subjective:  Patient ID: Ray Jackson, male    DOB: 27-Nov-1955,  MRN: [id number]  Chief Complaint  Patient presents with   Nail Problem    Patient is here for RFC and nail trim    67 y.o. male presents with the above complaint. History confirmed with patient. Patient presenting with pain related to dystrophic thickened elongated nails. Patient is unable to trim own nails related to nail dystrophy and/or mobility issues. Patient does not have a history of T2DM.   Objective:  Physical Exam: warm, good capillary refill nail exam onychomycosis of the toenails, onycholysis, and dystrophic nails DP pulses palpable, PT pulses palpable, and protective sensation intact Left Foot:  Pain with palpation of nails due to elongation and dystrophic growth.  Right Foot: Pain with palpation of nails due to elongation and dystrophic growth.   Assessment:   1. Pain due to onychomycosis of toenails of both feet   2. Stage 3a chronic kidney disease (HCC)        Plan:  Patient was evaluated and treated and all questions answered.    #Onychomycosis with pain  -Nails palliatively debrided as below. -Educated on self-care  Procedure: Nail Debridement Rationale: Pain Type of Debridement: manual, sharp debridement. Instrumentation: Nail nipper, rotary burr. Number of Nails: 10  Return in about 3 months (around 09/27/2023) for RFC.         Corinna Gab, DPM [facility] / [facility]

## 2023-10-03 ENCOUNTER — Inpatient Hospital Stay (HOSPITAL_COMMUNITY): Payer: Medicare Other

## 2023-10-03 ENCOUNTER — Encounter: Payer: Self-pay | Admitting: Podiatry

## 2023-10-03 ENCOUNTER — Encounter (HOSPITAL_COMMUNITY): Payer: Self-pay | Admitting: Pulmonary Disease

## 2023-10-03 ENCOUNTER — Emergency Department (HOSPITAL_COMMUNITY): Payer: Medicare Other

## 2023-10-03 ENCOUNTER — Inpatient Hospital Stay (HOSPITAL_COMMUNITY)
Admission: EM | Admit: 2023-10-03 | Discharge: 2023-10-19 | DRG: 871 | Disposition: E | Payer: Medicare Other | Attending: Internal Medicine | Admitting: Internal Medicine

## 2023-10-03 ENCOUNTER — Ambulatory Visit (INDEPENDENT_AMBULATORY_CARE_PROVIDER_SITE_OTHER): Payer: Medicare Other | Admitting: Podiatry

## 2023-10-03 VITALS — Ht 72.0 in | Wt 136.0 lb

## 2023-10-03 DIAGNOSIS — E43 Unspecified severe protein-calorie malnutrition: Secondary | ICD-10-CM | POA: Diagnosis present

## 2023-10-03 DIAGNOSIS — I5032 Chronic diastolic (congestive) heart failure: Secondary | ICD-10-CM | POA: Diagnosis present

## 2023-10-03 DIAGNOSIS — J189 Pneumonia, unspecified organism: Secondary | ICD-10-CM | POA: Diagnosis present

## 2023-10-03 DIAGNOSIS — I428 Other cardiomyopathies: Secondary | ICD-10-CM | POA: Diagnosis not present

## 2023-10-03 DIAGNOSIS — F209 Schizophrenia, unspecified: Secondary | ICD-10-CM | POA: Diagnosis present

## 2023-10-03 DIAGNOSIS — E440 Moderate protein-calorie malnutrition: Secondary | ICD-10-CM | POA: Diagnosis not present

## 2023-10-03 DIAGNOSIS — A419 Sepsis, unspecified organism: Principal | ICD-10-CM | POA: Diagnosis present

## 2023-10-03 DIAGNOSIS — B351 Tinea unguium: Secondary | ICD-10-CM

## 2023-10-03 DIAGNOSIS — R7401 Elevation of levels of liver transaminase levels: Secondary | ICD-10-CM | POA: Diagnosis present

## 2023-10-03 DIAGNOSIS — R627 Adult failure to thrive: Secondary | ICD-10-CM | POA: Diagnosis present

## 2023-10-03 DIAGNOSIS — I10 Essential (primary) hypertension: Secondary | ICD-10-CM | POA: Diagnosis not present

## 2023-10-03 DIAGNOSIS — R338 Other retention of urine: Secondary | ICD-10-CM | POA: Diagnosis present

## 2023-10-03 DIAGNOSIS — N35919 Unspecified urethral stricture, male, unspecified site: Secondary | ICD-10-CM | POA: Diagnosis present

## 2023-10-03 DIAGNOSIS — N1831 Chronic kidney disease, stage 3a: Secondary | ICD-10-CM | POA: Diagnosis present

## 2023-10-03 DIAGNOSIS — Z66 Do not resuscitate: Secondary | ICD-10-CM | POA: Diagnosis not present

## 2023-10-03 DIAGNOSIS — R6521 Severe sepsis with septic shock: Secondary | ICD-10-CM | POA: Diagnosis present

## 2023-10-03 DIAGNOSIS — N312 Flaccid neuropathic bladder, not elsewhere classified: Secondary | ICD-10-CM | POA: Diagnosis present

## 2023-10-03 DIAGNOSIS — J440 Chronic obstructive pulmonary disease with acute lower respiratory infection: Secondary | ICD-10-CM | POA: Diagnosis present

## 2023-10-03 DIAGNOSIS — F17210 Nicotine dependence, cigarettes, uncomplicated: Secondary | ICD-10-CM | POA: Diagnosis present

## 2023-10-03 DIAGNOSIS — Z6821 Body mass index (BMI) 21.0-21.9, adult: Secondary | ICD-10-CM

## 2023-10-03 DIAGNOSIS — D519 Vitamin B12 deficiency anemia, unspecified: Secondary | ICD-10-CM | POA: Diagnosis present

## 2023-10-03 DIAGNOSIS — I2489 Other forms of acute ischemic heart disease: Secondary | ICD-10-CM | POA: Diagnosis present

## 2023-10-03 DIAGNOSIS — N179 Acute kidney failure, unspecified: Secondary | ICD-10-CM | POA: Diagnosis present

## 2023-10-03 DIAGNOSIS — J9601 Acute respiratory failure with hypoxia: Secondary | ICD-10-CM | POA: Diagnosis present

## 2023-10-03 DIAGNOSIS — M79675 Pain in left toe(s): Secondary | ICD-10-CM | POA: Diagnosis not present

## 2023-10-03 DIAGNOSIS — J9602 Acute respiratory failure with hypercapnia: Secondary | ICD-10-CM | POA: Diagnosis present

## 2023-10-03 DIAGNOSIS — N401 Enlarged prostate with lower urinary tract symptoms: Secondary | ICD-10-CM | POA: Diagnosis present

## 2023-10-03 DIAGNOSIS — I469 Cardiac arrest, cause unspecified: Principal | ICD-10-CM | POA: Diagnosis present

## 2023-10-03 DIAGNOSIS — Z79899 Other long term (current) drug therapy: Secondary | ICD-10-CM

## 2023-10-03 DIAGNOSIS — E876 Hypokalemia: Secondary | ICD-10-CM | POA: Diagnosis not present

## 2023-10-03 DIAGNOSIS — M79674 Pain in right toe(s): Secondary | ICD-10-CM | POA: Diagnosis not present

## 2023-10-03 DIAGNOSIS — G931 Anoxic brain damage, not elsewhere classified: Secondary | ICD-10-CM | POA: Diagnosis present

## 2023-10-03 DIAGNOSIS — J690 Pneumonitis due to inhalation of food and vomit: Secondary | ICD-10-CM | POA: Diagnosis present

## 2023-10-03 DIAGNOSIS — E861 Hypovolemia: Secondary | ICD-10-CM | POA: Diagnosis not present

## 2023-10-03 DIAGNOSIS — I130 Hypertensive heart and chronic kidney disease with heart failure and stage 1 through stage 4 chronic kidney disease, or unspecified chronic kidney disease: Secondary | ICD-10-CM | POA: Diagnosis present

## 2023-10-03 DIAGNOSIS — J449 Chronic obstructive pulmonary disease, unspecified: Secondary | ICD-10-CM | POA: Diagnosis not present

## 2023-10-03 DIAGNOSIS — E232 Diabetes insipidus: Secondary | ICD-10-CM | POA: Diagnosis present

## 2023-10-03 DIAGNOSIS — Z515 Encounter for palliative care: Secondary | ICD-10-CM | POA: Diagnosis not present

## 2023-10-03 DIAGNOSIS — G9340 Encephalopathy, unspecified: Secondary | ICD-10-CM | POA: Diagnosis not present

## 2023-10-03 DIAGNOSIS — R092 Respiratory arrest: Secondary | ICD-10-CM

## 2023-10-03 HISTORY — DX: Chronic obstructive pulmonary disease, unspecified: J44.9

## 2023-10-03 HISTORY — DX: Benign prostatic hyperplasia without lower urinary tract symptoms: N40.0

## 2023-10-03 LAB — CBC WITH DIFFERENTIAL/PLATELET
Abs Immature Granulocytes: 0.77 10*3/uL — ABNORMAL HIGH (ref 0.00–0.07)
Basophils Absolute: 0.1 10*3/uL (ref 0.0–0.1)
Basophils Relative: 1 %
Eosinophils Absolute: 0.1 10*3/uL (ref 0.0–0.5)
Eosinophils Relative: 1 %
HCT: 36.7 % — ABNORMAL LOW (ref 39.0–52.0)
Hemoglobin: 10.8 g/dL — ABNORMAL LOW (ref 13.0–17.0)
Immature Granulocytes: 7 %
Lymphocytes Relative: 39 %
Lymphs Abs: 4.6 10*3/uL — ABNORMAL HIGH (ref 0.7–4.0)
MCH: 32.1 pg (ref 26.0–34.0)
MCHC: 29.4 g/dL — ABNORMAL LOW (ref 30.0–36.0)
MCV: 109.2 fL — ABNORMAL HIGH (ref 80.0–100.0)
Monocytes Absolute: 0.4 10*3/uL (ref 0.1–1.0)
Monocytes Relative: 3 %
Neutro Abs: 5.8 10*3/uL (ref 1.7–7.7)
Neutrophils Relative %: 49 %
Platelets: 233 10*3/uL (ref 150–400)
RBC: 3.36 MIL/uL — ABNORMAL LOW (ref 4.22–5.81)
RDW: 13.1 % (ref 11.5–15.5)
WBC: 11.7 10*3/uL — ABNORMAL HIGH (ref 4.0–10.5)
nRBC: 0.3 % — ABNORMAL HIGH (ref 0.0–0.2)

## 2023-10-03 LAB — I-STAT VENOUS BLOOD GAS, ED
Acid-base deficit: 8 mmol/L — ABNORMAL HIGH (ref 0.0–2.0)
Bicarbonate: 21.4 mmol/L (ref 20.0–28.0)
Calcium, Ion: 1.32 mmol/L (ref 1.15–1.40)
HCT: 32 % — ABNORMAL LOW (ref 39.0–52.0)
Hemoglobin: 10.9 g/dL — ABNORMAL LOW (ref 13.0–17.0)
O2 Saturation: 78 %
Potassium: 3.3 mmol/L — ABNORMAL LOW (ref 3.5–5.1)
Sodium: 143 mmol/L (ref 135–145)
TCO2: 23 mmol/L (ref 22–32)
pCO2, Ven: 65.8 mm[Hg] — ABNORMAL HIGH (ref 44–60)
pH, Ven: 7.119 — CL (ref 7.25–7.43)
pO2, Ven: 57 mm[Hg] — ABNORMAL HIGH (ref 32–45)

## 2023-10-03 LAB — GLUCOSE, CAPILLARY
Glucose-Capillary: 189 mg/dL — ABNORMAL HIGH (ref 70–99)
Glucose-Capillary: 236 mg/dL — ABNORMAL HIGH (ref 70–99)

## 2023-10-03 LAB — I-STAT CHEM 8, ED
BUN: 19 mg/dL (ref 8–23)
Calcium, Ion: 1.29 mmol/L (ref 1.15–1.40)
Chloride: 107 mmol/L (ref 98–111)
Creatinine, Ser: 2.2 mg/dL — ABNORMAL HIGH (ref 0.61–1.24)
Glucose, Bld: 228 mg/dL — ABNORMAL HIGH (ref 70–99)
HCT: 33 % — ABNORMAL LOW (ref 39.0–52.0)
Hemoglobin: 11.2 g/dL — ABNORMAL LOW (ref 13.0–17.0)
Potassium: 3.2 mmol/L — ABNORMAL LOW (ref 3.5–5.1)
Sodium: 142 mmol/L (ref 135–145)
TCO2: 23 mmol/L (ref 22–32)

## 2023-10-03 LAB — I-STAT ARTERIAL BLOOD GAS, ED
Acid-base deficit: 4 mmol/L — ABNORMAL HIGH (ref 0.0–2.0)
Bicarbonate: 24.9 mmol/L (ref 20.0–28.0)
Calcium, Ion: 1.29 mmol/L (ref 1.15–1.40)
HCT: 29 % — ABNORMAL LOW (ref 39.0–52.0)
Hemoglobin: 9.9 g/dL — ABNORMAL LOW (ref 13.0–17.0)
O2 Saturation: 100 %
Potassium: 3.2 mmol/L — ABNORMAL LOW (ref 3.5–5.1)
Sodium: 143 mmol/L (ref 135–145)
TCO2: 27 mmol/L (ref 22–32)
pCO2 arterial: 66.4 mm[Hg] (ref 32–48)
pH, Arterial: 7.183 — CL (ref 7.35–7.45)
pO2, Arterial: 439 mm[Hg] — ABNORMAL HIGH (ref 83–108)

## 2023-10-03 LAB — TROPONIN I (HIGH SENSITIVITY)
Troponin I (High Sensitivity): 138 ng/L (ref <18)
Troponin I (High Sensitivity): 683 ng/L (ref <18)

## 2023-10-03 LAB — I-STAT CG4 LACTIC ACID, ED: Lactic Acid, Venous: 7.1 mmol/L (ref 0.5–1.9)

## 2023-10-03 LAB — TYPE AND SCREEN
ABO/RH(D): O POS
Antibody Screen: NEGATIVE

## 2023-10-03 LAB — COMPREHENSIVE METABOLIC PANEL
ALT: 98 U/L — ABNORMAL HIGH (ref 0–44)
AST: 136 U/L — ABNORMAL HIGH (ref 15–41)
Albumin: 2.5 g/dL — ABNORMAL LOW (ref 3.5–5.0)
Alkaline Phosphatase: 105 U/L (ref 38–126)
Anion gap: 14 (ref 5–15)
BUN: 15 mg/dL (ref 8–23)
CO2: 20 mmol/L — ABNORMAL LOW (ref 22–32)
Calcium: 9.9 mg/dL (ref 8.9–10.3)
Chloride: 108 mmol/L (ref 98–111)
Creatinine, Ser: 2.23 mg/dL — ABNORMAL HIGH (ref 0.61–1.24)
GFR, Estimated: 31 mL/min — ABNORMAL LOW (ref >60)
Glucose, Bld: 238 mg/dL — ABNORMAL HIGH (ref 70–99)
Potassium: 3.4 mmol/L — ABNORMAL LOW (ref 3.5–5.1)
Sodium: 142 mmol/L (ref 135–145)
Total Bilirubin: 0.7 mg/dL (ref 0.0–1.2)
Total Protein: 4.8 g/dL — ABNORMAL LOW (ref 6.5–8.1)

## 2023-10-03 LAB — LIPASE, BLOOD: Lipase: 32 U/L (ref 11–51)

## 2023-10-03 LAB — PROTIME-INR
INR: 1.2 (ref 0.8–1.2)
Prothrombin Time: 15.5 s — ABNORMAL HIGH (ref 11.4–15.2)

## 2023-10-03 LAB — MAGNESIUM: Magnesium: 2.2 mg/dL (ref 1.7–2.4)

## 2023-10-03 LAB — HEMOGLOBIN A1C
Hgb A1c MFr Bld: 4.9 % (ref 4.8–5.6)
Mean Plasma Glucose: 93.93 mg/dL

## 2023-10-03 LAB — APTT: aPTT: 33 s (ref 24–36)

## 2023-10-03 LAB — PHOSPHORUS: Phosphorus: 6.5 mg/dL — ABNORMAL HIGH (ref 2.5–4.6)

## 2023-10-03 LAB — MRSA NEXT GEN BY PCR, NASAL: MRSA by PCR Next Gen: NOT DETECTED

## 2023-10-03 LAB — LITHIUM LEVEL: Lithium Lvl: 0.29 mmol/L — ABNORMAL LOW (ref 0.60–1.20)

## 2023-10-03 LAB — ABO/RH: ABO/RH(D): O POS

## 2023-10-03 LAB — PROCALCITONIN: Procalcitonin: 0.1 ng/mL

## 2023-10-03 LAB — CBG MONITORING, ED: Glucose-Capillary: 187 mg/dL — ABNORMAL HIGH (ref 70–99)

## 2023-10-03 MED ORDER — VANCOMYCIN HCL 1500 MG/300ML IV SOLN
1500.0000 mg | Freq: Once | INTRAVENOUS | Status: AC
  Administered 2023-10-03: 1500 mg via INTRAVENOUS
  Filled 2023-10-03: qty 300

## 2023-10-03 MED ORDER — SODIUM CHLORIDE 0.9 % IV SOLN
2.0000 g | Freq: Once | INTRAVENOUS | Status: AC
  Administered 2023-10-03: 2 g via INTRAVENOUS
  Filled 2023-10-03: qty 12.5

## 2023-10-03 MED ORDER — LACTATED RINGERS IV SOLN: INTRAVENOUS | Status: DC

## 2023-10-03 MED ORDER — POLYETHYLENE GLYCOL 3350 17 G PO PACK: 17.0000 g | PACK | Freq: Every day | ORAL | Status: DC | PRN

## 2023-10-03 MED ORDER — ORAL CARE MOUTH RINSE: 15.0000 mL | OROMUCOSAL | Status: DC | PRN

## 2023-10-03 MED ORDER — ENOXAPARIN SODIUM 40 MG/0.4ML IJ SOSY
40.0000 mg | PREFILLED_SYRINGE | INTRAMUSCULAR | Status: DC
Start: 2023-10-03 — End: 2023-10-04
  Administered 2023-10-03: 40 mg via SUBCUTANEOUS
  Filled 2023-10-03: qty 0.4

## 2023-10-03 MED ORDER — CHLORHEXIDINE GLUCONATE CLOTH 2 % EX PADS
6.0000 | MEDICATED_PAD | Freq: Every day | CUTANEOUS | Status: DC
  Administered 2023-10-03 – 2023-10-04 (×2): 6 via TOPICAL

## 2023-10-03 MED ORDER — LACTATED RINGERS IV BOLUS
2000.0000 mL | Freq: Once | INTRAVENOUS | Status: AC
  Administered 2023-10-03: 2000 mL via INTRAVENOUS

## 2023-10-03 MED ORDER — POLYETHYLENE GLYCOL 3350 17 G PO PACK
17.0000 g | PACK | Freq: Every day | ORAL | Status: DC
  Administered 2023-10-03 – 2023-10-04 (×2): 17 g via ORAL
  Filled 2023-10-03 (×2): qty 1

## 2023-10-03 MED ORDER — INSULIN ASPART 100 UNIT/ML IJ SOLN
0.0000 [IU] | INTRAMUSCULAR | Status: DC
  Administered 2023-10-03: 2 [IU] via SUBCUTANEOUS
  Administered 2023-10-03: 3 [IU] via SUBCUTANEOUS
  Administered 2023-10-04: 1 [IU] via SUBCUTANEOUS
  Administered 2023-10-04: 2 [IU] via SUBCUTANEOUS

## 2023-10-03 MED ORDER — DOCUSATE SODIUM 100 MG PO CAPS
100.0000 mg | ORAL_CAPSULE | Freq: Two times a day (BID) | ORAL | Status: DC
Start: 2023-10-03 — End: 2023-10-04
  Administered 2023-10-03: 100 mg via ORAL
  Filled 2023-10-03: qty 1

## 2023-10-03 MED ORDER — SODIUM BICARBONATE 8.4 % IV SOLN
INTRAVENOUS | Status: AC | PRN
  Administered 2023-10-03: 50 meq via INTRAVENOUS

## 2023-10-03 MED ORDER — IPRATROPIUM-ALBUTEROL 0.5-2.5 (3) MG/3ML IN SOLN
3.0000 mL | Freq: Four times a day (QID) | RESPIRATORY_TRACT | Status: DC
  Administered 2023-10-03 – 2023-10-05 (×7): 3 mL via RESPIRATORY_TRACT
  Filled 2023-10-03 (×7): qty 3

## 2023-10-03 MED ORDER — CALCIUM CHLORIDE 10 % IV SOLN
INTRAVENOUS | Status: AC | PRN
  Administered 2023-10-03: 1 g via INTRAVENOUS

## 2023-10-03 MED ORDER — ORAL CARE MOUTH RINSE
15.0000 mL | OROMUCOSAL | Status: DC
  Administered 2023-10-03 – 2023-10-05 (×20): 15 mL via OROMUCOSAL

## 2023-10-03 MED ORDER — SODIUM CHLORIDE 0.9 % IV SOLN
1.0000 g | INTRAVENOUS | Status: DC
  Administered 2023-10-04: 1 g via INTRAVENOUS
  Filled 2023-10-03 (×3): qty 10

## 2023-10-03 MED ORDER — SODIUM CHLORIDE 0.9 % IV BOLUS
1000.0000 mL | Freq: Once | INTRAVENOUS | Status: AC
  Administered 2023-10-03: 1000 mL via INTRAVENOUS

## 2023-10-03 MED ORDER — NOREPINEPHRINE 4 MG/250ML-% IV SOLN
0.0000 ug/min | INTRAVENOUS | Status: DC
  Administered 2023-10-03: 8 ug/min via INTRAVENOUS
  Administered 2023-10-03: 15 ug/min via INTRAVENOUS
  Filled 2023-10-03: qty 250

## 2023-10-03 MED ORDER — EPINEPHRINE 1 MG/10ML IJ SOSY
PREFILLED_SYRINGE | INTRAMUSCULAR | Status: AC | PRN
  Administered 2023-10-03 (×5): 1 mg via INTRAVENOUS

## 2023-10-03 MED ORDER — POTASSIUM CHLORIDE 10 MEQ/100ML IV SOLN
10.0000 meq | INTRAVENOUS | Status: AC
  Administered 2023-10-03 (×3): 10 meq via INTRAVENOUS
  Filled 2023-10-03 (×3): qty 100

## 2023-10-03 MED ORDER — FAMOTIDINE 20 MG PO TABS
20.0000 mg | ORAL_TABLET | Freq: Every day | ORAL | Status: DC
  Administered 2023-10-03: 20 mg
  Filled 2023-10-03: qty 1

## 2023-10-03 NOTE — ED Triage Notes (Signed)
Pt BIB [facility] from a group home. Per staff pt had just sat down to eat dinner and came back and found the pt unresponsive. Approx. 2 minutes of the pt being unwitnessed and initiation of CPR. ROSC regained after 10 minutes of CPR and 2 epi. Given IV narcan and 2 doses of atropine by EMS en route to ED. Pt loss pulses en route to ED after having pulses approx. 25 minutes. CPR restarted at 1830, 2 epi given by EMS. Arrives to ED as CPR in progress.

## 2023-10-03 NOTE — H&P (Signed)
NAME:  Ray Jackson, MRN:  [id number], DOB:  [birth year], LOS: 0 ADMISSION DATE:  10/03/2023 CONSULTATION DATE:  10/03/2023 REFERRING MD:  Lockie Mola - EDP, CHIEF COMPLAINT:  Cardiac arrest   History of Present Illness:  68 year old man who presented to [facility] ED 2/13 via EMS for cardiac arrest. PMHx significant for HTN, COPD, chronic anemia 2/2 vitamin B12 deficiency, CKD stage 3a, BPH, schizophrenia (c/b lithium toxicity 10/2022), FTT/malnutrition.  History is obtained primarily from chart review and from EMS report. Patient is a resident of a group home and was found unresponsive by staff a few minutes after sitting down to eat dinner. CPR was initiated x 10 minutes with Epi administration x 2 and ROSC. Patient reportedly had pulses for ~25 minutes prior to arresting again en route with EMS. CPR reinitiated and Epi x 2 given by EMS. Patient was intubated in ED with ROSC. Of note, copious food debris in patient's trachea was noted by EDP on intubation and by RN on OGT placement. Labs were notable for WBC 11.7, Hgb 10.8 (baseline ~12), Plt 233. INR 1.2. Chem-8 with Na 142, K 3.2, CO2 23, iCal 1.29, Cr 2.20 (baseline 1.6-1.8). LA 7.1. Imaging pending.  PCCM consulted for ICU admission.  Pertinent Medical History:   Past Medical History:  Diagnosis Date   ARF (acute renal failure) (HCC) 10/02/2017   BPH (benign prostatic hyperplasia)    Constipation    COPD (chronic obstructive pulmonary disease) (HCC)    Dyspnea    Hypertension    Hypokalemia    Hyponatremia 10/02/2017   Noncompliance 11/05/2017   Schizophrenia (HCC)    Significant Hospital Events: Including procedures, antibiotic start and stop dates in addition to other pertinent events   2/13 - Presented to [facility] ED via EMS as a CPR in progress. CPR x 10 minutes (Epi x 2) in the field with ROSC. Pulses x 25 minutes then lost again en route to ED. Additional CPR + Epi x 2 by EMS. Intubated on ED arrival.  Interim History / Subjective:  PCCM  consulted for ICU admission.  Objective:  Blood pressure (!) 97/59, pulse 94, resp. rate 16, SpO2 99%.    Vent Mode: PRVC FiO2 (%):  [100 %] 100 % Set Rate:  [18 bmp] 18 bmp Vt Set:  [500 mL] 500 mL PEEP:  [5 cmH20] 5 cmH20  No intake or output data in the 24 hours ending 10/03/23 1952 There were no vitals filed for this visit.  Physical Examination: General: Chronically ill-appearing older man in NAD. HEENT: [place]/AT, anicteric sclera, pupils equal round 2mm and nonreactive, moist mucous membranes. Neuro:  Intubated, unresponsive.  Does not respond to verbal, tactile or noxious stimuli. Does not withdraw to pain. Not following commands. No spontaneous movement of extremities noted. No corneal, cough or gag reflex. CV: RRR, no m/g/r. PULM: Breathing even and unlabored on vent (PEEP 5, FIO2 50%). Lung fields rhonchorous throughout. GI: Soft, nontender, nondistended. Normoactive bowel sounds. Extremities: No significant LE edema noted. Skin: Warm/dry, no rashes.  Resolved Hospital Problem List:    Assessment & Plan:  Post-cardiac arrest - Admit to ICU for close monitoring - Targeted temperature management/normothermia protocol - Goal MAP > 65 - Fluid resuscitation as tolerated - Levophed titrated to goal MAP, may require central access - Trend troponin - F/u Echo - Spot EEG r/o seizure activity - F/u CT Head  HTN - Hold home antihypertensives in the setting of hypotension - Cardiac monitoring - Optimize electrolytes for K > 4, Mg >  2  Acute hypoxemic and hypercarbic respiratory failure in the setting of aspiration, cardiac arrest Aspiration PNA COPD - Continue full vent support (4-8cc/kg IBW) - Wean FiO2 for O2 sat > 90% - Daily WUA/SBT once appropriate from a mental status standpoint - VAP bundle - Bronchodilators PRN (home Spiriva, albuterol PRN) - Pulmonary hygiene - PAD protocol for sedation: Fentanyl PRN for goal RASS 0 to -1, limiting sedation as able to allow for  accurate neurologic examination - Cefepime for now for broad aspiration coverage - Follow ABG, CXR  CKD stage 3a BPH - Trend BMP - Replete electrolytes as indicated - Monitor I&Os - F/u urine studies - Avoid nephrotoxic agents as able - Ensure adequate renal perfusion  Chronic anemia in the setting of vitamin B12 deficiency - Trend H&H - Monitor for signs of active bleeding - Transfuse for Hgb < 7.0 or hemodynamically significant bleeding - Check B12 levels  Schizophrenia Resident of a group home; patient has a legal guardian Advice worker). - Medication reconciliation as able (Klonopin, Aricept, Prozac, Lithium, Remeron, Zyprexa, Invega, Trazodone all listed on PTA medication list) - Consider lithium level  FTT Moderate protein-calorie malnutrition - RD/Nutrition consult - TF via OGT  Best Practice: (right click and "Reselect all SmartList Selections" daily)   Diet/type: NPO, TF initiation as appropriate DVT prophylaxis: SCDs, awaiting CT Head GI prophylaxis: PPI Lines: N/A, may need central access pending NE trajectory Foley:  Yes, and it is still needed Code Status:  full code Last date of multidisciplinary goals of care discussion [Pending - of note, patient has legal guardian [name] Witcher]  Labs:  CBC: Recent Labs  Lab 10/03/23 1913 10/03/23 1923  WBC 11.7*  --   NEUTROABS PENDING  --   HGB 10.8* 10.9*  11.2*  HCT 36.7* 32.0*  33.0*  MCV 109.2*  --   PLT 233  --    Basic Metabolic Panel: Recent Labs  Lab 10/03/23 1923  NA 143  142  K 3.3*  3.2*  CL 107  GLUCOSE 228*  BUN 19  CREATININE 2.20*   GFR: Estimated Creatinine Clearance: 28 mL/min (A) (by C-G formula based on SCr of 2.2 mg/dL (H)). Recent Labs  Lab 10/03/23 1913 10/03/23 1923  WBC 11.7*  --   LATICACIDVEN  --  7.1*   Liver Function Tests: No results for input(s): "AST", "ALT", "ALKPHOS", "BILITOT", "PROT", "ALBUMIN" in the last 168 hours. No results for input(s): "LIPASE",  "AMYLASE" in the last 168 hours. No results for input(s): "AMMONIA" in the last 168 hours.  ABG:    Component Value Date/Time   HCO3 21.4 10/03/2023 1923   TCO2 23 10/03/2023 1923   TCO2 23 10/03/2023 1923   ACIDBASEDEF 8.0 (H) 10/03/2023 1923   O2SAT 78 10/03/2023 1923    Coagulation Profile: Recent Labs  Lab 10/03/23 1913  INR 1.2   Cardiac Enzymes: No results for input(s): "CKTOTAL", "CKMB", "CKMBINDEX", "TROPONINI" in the last 168 hours.  HbA1C: Hemoglobin A1C  Date/Time Value Ref Range Status  04/06/2014 06:36 AM 5.1 4.2 - 6.3 % Final    Comment:    The American Diabetes Association recommends that a primary goal of therapy should be <7% and that physicians should reevaluate the treatment regimen in patients with HbA1c values consistently >8%.    Hgb A1c MFr Bld  Date/Time Value Ref Range Status  11/06/2017 07:20 AM 4.5 (L) 4.8 - 5.6 % Final    Comment:    (NOTE) Pre diabetes:  5.7%-6.4% Diabetes:              >6.4% Glycemic control for   <7.0% adults with diabetes    CBG: No results for input(s): "GLUCAP" in the last 168 hours.  Review of Systems:   Patient is encephalopathic and/or intubated; therefore, history has been obtained from chart review.   Past Medical History:  He,  has a past medical history of ARF (acute renal failure) (HCC) (10/02/2017), BPH (benign prostatic hyperplasia), Constipation, COPD (chronic obstructive pulmonary disease) (HCC), Dyspnea, Hypertension, Hypokalemia, Hyponatremia (10/02/2017), Noncompliance (11/05/2017), and Schizophrenia (HCC).   Surgical History:   Past Surgical History:  Procedure Laterality Date   CYSTOSCOPY WITH INSERTION OF UROLIFT N/A 03/24/2018   Procedure: CYSTOSCOPY WITH INSERTION OF UROLIFT;  Surgeon: Malen Gauze, MD;  Location: [facility];  Service: Urology;  Laterality: N/A;   TONSILLECTOMY     Social History:   reports that he has been smoking cigarettes. He has never used smokeless  tobacco. He reports that he does not drink alcohol and does not use drugs.   Family History:  His family history is not on file.   Allergies: No Known Allergies   Home Medications: Prior to Admission medications   Medication Sig Start Date End Date Taking? Authorizing Provider  acetaminophen (TYLENOL) 325 MG tablet Take 2 tablets (650 mg total) by mouth every 6 (six) hours as needed for mild pain (or Fever >/= 101). 05/10/18   Emokpae, Courage, MD  albuterol (PROVENTIL) (2.5 MG/3ML) 0.083% nebulizer solution Take 2.5 mg by nebulization every 6 (six) hours as needed for wheezing or shortness of breath.    [provider]  amLODipine (NORVASC) 5 MG tablet Take 1 tablet (5 mg total) by mouth daily. 05/11/18   Shon Hale, MD  cetirizine (ZYRTEC) 10 MG tablet Take 10 mg by mouth daily.     [provider]  Cholecalciferol (VITAMIN D3) 2000 units capsule Take 2,000 Units by mouth daily.    [provider]  clonazePAM (KLONOPIN) 1 MG tablet Take 1 mg by mouth at bedtime.     [provider]  diclofenac (VOLTAREN) 50 MG EC tablet Take 50 mg by mouth daily as needed for mild pain. 10/26/20   [provider]  donepezil (ARICEPT) 10 MG tablet Take 10 mg by mouth daily. 09/30/23   [provider]  feeding supplement (ENSURE ENLIVE / ENSURE PLUS) LIQD Take 237 mLs by mouth 3 (three) times daily between meals. 10/23/22   Shahmehdi, Gemma Payor, MD  finasteride (PROSCAR) 5 MG tablet Take 5 mg by mouth daily.    [provider]  FLUoxetine (PROZAC) 20 MG capsule Take 20 mg by mouth daily. 12/19/20   [provider]  fluticasone (FLONASE) 50 MCG/ACT nasal spray Place 2 sprays into both nostrils daily.    [provider]  hydrALAZINE (APRESOLINE) 50 MG tablet Take 1 tablet (50 mg total) by mouth 3 (three) times daily. 05/10/18   Shon Hale, MD  isosorbide mononitrate (IMDUR) 30 MG 24 hr tablet Take 1 tablet (30 mg total) by mouth  daily. 05/11/18   Shon Hale, MD  lithium 300 MG tablet Take 300 mg by mouth. 09/24/23   [provider]  mirtazapine (REMERON) 30 MG tablet Take 30 mg by mouth at bedtime.    [provider]  OLANZapine (ZYPREXA) 5 MG tablet Take 5 mg by mouth at bedtime. 12/19/20   [provider]  oxybutynin (DITROPAN) 5 MG tablet Take 5 mg  by mouth 2 (two) times daily.    [provider]  Paliperidone Palmitate ER (INVEGA TRINZA) 546 MG/1.75ML SUSY Inject 546 mg into the muscle See admin instructions. Every 3 months    [provider]  senna-docusate (SENOKOT-S) 8.6-50 MG tablet Take 1 tablet by mouth daily.    [provider]  tamsulosin (FLOMAX) 0.4 MG CAPS capsule Take 0.4 mg by mouth daily.    [provider]  tiotropium (SPIRIVA) 18 MCG inhalation capsule Place 18 mcg into inhaler and inhale daily.    [provider]  traZODone (DESYREL) 50 MG tablet Take 1 tablet (50 mg total) by mouth at bedtime. 05/10/18   Shon Hale, MD  vitamin B-12 (CYANOCOBALAMIN) 1000 MCG tablet Take 1,000 mcg by mouth daily.    [provider]    Critical care time:   The patient is critically ill with multiple organ system failure and requires high complexity decision making for assessment and support, frequent evaluation and titration of therapies, advanced monitoring, review of radiographic studies and interpretation of complex data.   Critical Care Time devoted to patient care services, exclusive of separately billable procedures, described in this note is 39 minutes.  Tim Lair, PA-C  Pulmonary & Critical Care 10/03/23 7:52 PM  Please see [email or web address] for pager details.  From 7A-7P if no response, please call [phone] After hours, please call ELink [phone]

## 2023-10-03 NOTE — Progress Notes (Signed)
eLink Physician-Brief Progress Note Patient Name: Ray Jackson DOB: June 11, 1956 MRN: [id number]   Date of Service  10/03/2023  HPI/Events of Note  68 year old man who presented to [facility] ED 2/13 via EMS for cardiac arrest. PMHx significant for HTN, COPD, chronic anemia 2/2 vitamin B12 deficiency, CKD stage 3a, BPH, schizophrenia (c/b lithium toxicity 10/2022), FTT/malnutrition.  Initially presents to the hospital with septic shock requiring mechanical ventilation secondary to aspiration pneumonia  Patient is hypothermic, bradycardic and hypotensive requiring norepinephrine infusion.  Saturating 93% on 60% FiO2.  Results consistent with acidemia with mixed respiratory and metabolic acidosis.  Hypokalemia, elevated creatinine, transaminitis and troponin elevation was noted.  Lactic acid was elevated blood and macrocytic anemia noted.  Chest radiograph and CT head reviewed.   eICU Interventions  Maintain peripheral vasopressors.  If high pressor requirements as needed, will request ground team evaluation for potential Central line.  Status post 3 L crystalloid.  Ongoing crystalloid infusion.  Trend procalcitonin.  Maintain broad-spectrum antibiotics with vancomycin and ceftriaxone.  DVT prophylaxis with enoxaparin GI prophylaxis with famotidine   0431 -patient has had urinary retention with multiple attempts at Foley catheter and coud catheter placement with no success.  Bladder scan shows greater than a liter in the bladder.  Previously had a UroLift procedure in 2019 but no other urological history.  Discussed the case with urology on-call-will attempt Foley catheter placement.  Requesting urojelly and foley cart at bedside.   Intervention Category Evaluation Type: New Patient Evaluation  K[place]mir Lysaght 10/03/2023, 9:37 PM

## 2023-10-03 NOTE — Progress Notes (Signed)
ED Pharmacy Antibiotic Sign Off An antibiotic consult was received from an ED provider for cefepime and vancomycin per pharmacy dosing for sepsis. A chart review was completed to assess appropriateness.   The following one time order(s) were placed per pharmacy consult:  vancomycin 1500 mg x 1 dose cefepime 2 g x 1 dose  Further antibiotic and/or antibiotic pharmacy consults should be ordered by the admitting provider if indicated.   Thank you for allowing pharmacy to be a part of this patient's care.   Ernestene Kiel, PharmD PGY1 Pharmacy Resident  Please check AMION for all [facility] Pharm[facility] phone numbers After 10:00 PM, call Main Pharmacy [phone]

## 2023-10-03 NOTE — Progress Notes (Signed)
Pt transported from Trauma C to CT and then 2H23 w/o complications

## 2023-10-03 NOTE — ED Provider Notes (Signed)
 [place] [facility]GENCY DE[facility]MENT AT [facility] Provider Note   CSN: [id number] Arrival date & time: 10/03/23  1842     History  Chief Complaint  Patient presents with   Cardiac Arrest    Ray Jackson is a 68 y.o. male.  Level 5 caveat due to CPR in progress.  Patient is coming from group home.  Patient had just sat down to eat dinner and staff came back to check on him a couple minutes after and found him unresponsive.  CPR was initiated by staff.  EMS got there he had CPR started by them and they had return of spontaneous circulation after 10 minutes of CPR following 2 doses of epinephrine.  He lost pulses shortly afterwards.  Did not have shockable rhythm.  He has had about CPR now for 2025 minutes with no return of spontaneous circulation with further epinephrine doses and no shockable rhythms.  He has a history of hypertension schizophrenia COPD.  The history is provided by the EMS personnel.       Home Medications Prior to Admission medications   Medication Sig Start Date End Date Taking? Authorizing Provider  acetaminophen (TYLENOL) 325 MG tablet Take 2 tablets (650 mg total) by mouth every 6 (six) hours as needed for mild pain (or Fever >/= 101). 05/10/18   Emokpae, Courage, MD  albuterol (PROVENTIL) (2.5 MG/3ML) 0.083% nebulizer solution Take 2.5 mg by nebulization every 6 (six) hours as needed for wheezing or shortness of breath.    [provider]  amLODipine (NORVASC) 5 MG tablet Take 1 tablet (5 mg total) by mouth daily. 05/11/18   Shon Hale, MD  cetirizine (ZYRTEC) 10 MG tablet Take 10 mg by mouth daily.     [provider]  Cholecalciferol (VITAMIN D3) 2000 units capsule Take 2,000 Units by mouth daily.    [provider]  clonazePAM (KLONOPIN) 1 MG tablet Take 1 mg by mouth at bedtime.     [provider]  diclofenac (VOLTAREN) 50 MG EC tablet Take 50 mg by mouth daily as needed for mild pain. 10/26/20   [provider]  donepezil (ARICEPT) 10 MG tablet Take 10 mg by mouth daily. 09/30/23   [provider]  feeding supplement (ENSURE ENLIVE / ENSURE PLUS) LIQD Take 237 mLs by mouth 3 (three) times daily between meals. 10/23/22   Shahmehdi, Gemma Payor, MD  finasteride (PROSCAR) 5 MG tablet Take 5 mg by mouth daily.    [provider]  FLUoxetine (PROZAC) 20 MG capsule Take 20 mg by mouth daily. 12/19/20   [provider]  fluticasone (FLONASE) 50 MCG/ACT nasal spray Place 2 sprays into both nostrils daily.    [provider]  hydrALAZINE (APRESOLINE) 50 MG tablet Take 1 tablet (50 mg total) by mouth 3 (three) times daily. 05/10/18   Shon Hale, MD  isosorbide mononitrate (IMDUR) 30 MG 24 hr tablet Take 1 tablet (30 mg total) by mouth daily. 05/11/18   Shon Hale, MD  lithium 300 MG tablet Take 300 mg by mouth. 09/24/23   [provider]  mirtazapine (REMERON) 30 MG tablet Take 30 mg by mouth at bedtime.    [provider]  OLANZapine (ZYPREXA) 5 MG tablet Take 5 mg by mouth at bedtime. 12/19/20   [provider]  oxybutynin (DITROPAN) 5 MG tablet Take 5 mg by mouth 2 (two) times daily.    [provider]  Paliperidone Palmitate ER (INVEGA TRINZA) 546 MG/1.75ML SUSY Inject  546 mg into the muscle See admin instructions. Every 3 months    [provider]  senna-docusate (SENOKOT-S) 8.6-50 MG tablet Take 1 tablet by mouth daily.    [provider]  tamsulosin (FLOMAX) 0.4 MG CAPS capsule Take 0.4 mg by mouth daily.    [provider]  tiotropium (SPIRIVA) 18 MCG inhalation capsule Place 18 mcg into inhaler and inhale daily.    [provider]  traZODone (DESYREL) 50 MG tablet Take 1 tablet (50 mg total) by mouth at bedtime. 05/10/18   Shon Hale, MD  vitamin B-12 (CYANOCOBALAMIN) 1000 MCG tablet Take 1,000 mcg by mouth daily.    [provider]      Allergies    Patient has no known  allergies.    Review of Systems   Review of Systems  Physical Exam Updated Vital Signs BP 100/66   Pulse 88   Resp 15   SpO2 99%  Physical Exam Vitals and nursing note reviewed.  Constitutional:      General: He is in acute distress.     Appearance: He is well-developed.  HENT:     Head: Normocephalic and atraumatic.     Comments: While trying to intubate patient had will look to be a BB food bolus/a lot of secretions undigested food in the posterior oropharynx that needed to be suctioned aggressively Eyes:     Conjunctiva/sclera: Conjunctivae normal.     Comments: Pupils are nonreactive  Cardiovascular:     Comments: No palpable pulses Pulmonary:     Effort: Pulmonary effort is normal. No respiratory distress.     Breath sounds: Normal breath sounds.  Abdominal:     Palpations: Abdomen is soft.  Musculoskeletal:     Cervical back: Neck supple.  Skin:    General: Skin is warm and dry.  Neurological:     Comments: GCS 3     ED Results / Procedures / Treatments   Labs (all labs ordered are listed, but only abnormal results are displayed) Labs Reviewed  CBC WITH DIFFERENTIAL/PLATELET - Abnormal; Notable for the following components:      Result Value   WBC 11.7 (*)    RBC 3.36 (*)    Hemoglobin 10.8 (*)    HCT 36.7 (*)    MCV 109.2 (*)    MCHC 29.4 (*)    nRBC 0.3 (*)    Lymphs Abs 4.6 (*)    Abs Immature Granulocytes 0.77 (*)    All other components within normal limits  COMPREHENSIVE METABOLIC PANEL - Abnormal; Notable for the following components:   Potassium 3.4 (*)    CO2 20 (*)    Glucose, Bld 238 (*)    Creatinine, Ser 2.23 (*)    Total Protein 4.8 (*)    Albumin 2.5 (*)    AST 136 (*)    ALT 98 (*)    GFR, Estimated 31 (*)    All other components within normal limits  PROTIME-INR - Abnormal; Notable for the following components:   Prothrombin Time 15.5 (*)    All other components within normal limits  I-STAT CHEM 8, ED - Abnormal; Notable for  the following components:   Potassium 3.2 (*)    Creatinine, Ser 2.20 (*)    Glucose, Bld 228 (*)    Hemoglobin 11.2 (*)    HCT 33.0 (*)    All other components within normal limits  I-STAT VENOUS BLOOD GAS, ED - Abnormal; Notable for the following components:  pH, Ven 7.119 (*)    pCO2, Ven 65.8 (*)    pO2, Ven 57 (*)    Acid-base deficit 8.0 (*)    Potassium 3.3 (*)    HCT 32.0 (*)    Hemoglobin 10.9 (*)    All other components within normal limits  I-STAT CG4 LACTIC ACID, ED - Abnormal; Notable for the following components:   Lactic Acid, Venous 7.1 (*)    All other components within normal limits  CBG MONITORING, ED - Abnormal; Notable for the following components:   Glucose-Capillary 187 (*)    All other components within normal limits  CULTURE, BLOOD (ROUTINE X 2)  CULTURE, BLOOD (ROUTINE X 2)  CULTURE, RESPIRATORY W GRAM STAIN  LIPASE, BLOOD  BLOOD GAS, ARTERIAL  CBC  BASIC METABOLIC PANEL  BLOOD GAS, ARTERIAL  MAGNESIUM  PHOSPHORUS  HEMOGLOBIN A1C  APTT  PHOSPHORUS  RAPID URINE DRUG SCREEN, HOSP PERFORMED  PROCALCITONIN  MAGNESIUM  I-STAT CG4 LACTIC ACID, ED  TYPE AND SCREEN  TROPONIN I (HIGH SENSITIVITY)    EKG None  Radiology DG Chest Portable 1 View Result Date: 10/03/2023 CLINICAL DATA:  Posterior arrest EXAM: PORTABLE CHEST 1 VIEW COMPARISON:  10/22/2022 FINDINGS: Endotracheal tube tip about 5.7 cm superior to the carina. Enteric tube tip below the diaphragm but incompletely visualized. No consolidation or effusion. Stable cardiomediastinal silhouette. No pneumothorax. Emphysema. Mild diffuse increased interstitial opacity in the upper lobes. Old right-sided rib fracture IMPRESSION: 1. Endotracheal tube tip about 5.7 cm superior to the carina. Enteric tube tip below the diaphragm but incompletely visualized. 2. Emphysema. Mild diffuse increased interstitial opacity in the upper lobes, question mild noncardiogenic edema or infection/inflammation.  Electronically Signed   By: Jasmine Pang M.D.   On: 10/03/2023 20:04    Procedures Procedure Name: Intubation Date/Time: 10/03/2023 8:13 PM  Performed by: Virgina Norfolk, DOPre-anesthesia Checklist: Patient identified, Patient being monitored, Emergency Drugs available and Timeout performed Oxygen Delivery Method: Ambu bag Ventilation: Mask ventilation with difficulty Grade View: Grade I Tube size: 7.5 mm Number of attempts: 3 Airway Equipment and Method: Rigid stylet Placement Confirmation: ETT inserted through vocal cords under direct vision, CO2 detector and Breath sounds checked- equal and bilateral Difficulty Due To: Difficulty was anticipated Future Recommendations: Recommend- induction with short-acting agent, and alternative techniques readily available    .Critical Care  Performed by: Virgina Norfolk, DO Authorized by: Virgina Norfolk, DO   Critical care provider statement:    Critical care time (minutes):  40   Critical care was necessary to treat or prevent imminent or life-threatening deterioration of the following conditions:  Respiratory failure and cardiac failure   Critical care was time spent personally by me on the following activities:  Blood draw for specimens, development of treatment plan with patient or surrogate, discussions with primary provider, evaluation of patient's response to treatment, examination of patient, interpretation of cardiac output measurements, obtaining history from patient or surrogate, ordering and performing treatments and interventions, ordering and review of laboratory studies, ordering and review of radiographic studies, pulse oximetry, re-evaluation of patient's condition and review of old charts   Care discussed with: admitting provider       Medications Ordered in ED Medications  norepinephrine (LEVOPHED) 4mg  in (0.016 mg/mL) premix infusion (10 mcg/min Intravenous Rate/Dose Change 10/03/23 1943)  lactated ringers infusion (  Intravenous New Bag/Given 10/03/23 2000)  ceFEPIme (MAXIPIME) 2 g in sodium chloride 0.9 % 100 mL IVPB (2 g Intravenous New Bag/Given 10/03/23 2000)  vancomycin (VANCOREADY)  IVPB 1500 mg/300 mL (has no administration in time range)  docusate sodium (COLACE) capsule 100 mg (has no administration in time range)  polyethylene glycol (MIRALAX / GLYCOLAX) packet 17 g (has no administration in time range)  enoxaparin (LOVENOX) injection 40 mg (has no administration in time range)  insulin aspart (novoLOG) injection 0-9 Units (has no administration in time range)  Chlorhexidine Gluconate Cloth 2 % PADS 6 each (has no administration in time range)  EPINEPHrine (ADRENALIN) 1 MG/10ML injection (1 mg Intravenous Given 10/03/23 1902)  calcium chloride injection (1 g Intravenous Given 10/03/23 1847)  sodium bicarbonate injection (50 mEq Intravenous Given 10/03/23 1849)  sodium chloride 0.9 % bolus 1,000 mL (0 mLs Intravenous Stopped 10/03/23 2001)  lactated ringers bolus 2,000 mL (2,000 mLs Intravenous New Bag/Given 10/03/23 1933)    ED Course/ Medical Decision Making/ A&P                                 Medical Decision Making Amount and/or Complexity of Data Reviewed Labs: ordered. Radiology: ordered.  Risk Prescription drug management. Decision regarding hospitalization.   Ray Jackson is here with CPR in progress.  History of schizophrenia hypertension.  Patient arrives with no pulse.  Pulse ox is 50%.  He was at group home had just been given dinner does not have any teeth.  He was found unresponsive shortly afterwards.  CPR initiated hopefully sounds like minutes after he was last seen normal.  Narcan was given.  Epinephrine given but no shockable rhythms.  He had return of spontaneous circulation about 10 minutes into CPR.  But lost pulses shortly after with bradycardic rhythm was given atropine.  Pulse ox has not been good and route there is a Scientist, clinical (histocompatibility and immunogenetics) airway in place.  On arrival he has no pulses CPR is  in progress.  When we went to go intubate patient had a ton of secretions undigested food looking material and had to aggressively suction.  After we did this for some time we able to successfully intubate the patient with glide scope with a grade 1 view.  He continued to have PEA rhythm with doses of epinephrine bicarb and calcium being given.  Shortly after intubation he did have return of spontaneous circulation.  Echocardiogram showed good cardiac function.  He had palpable pulses.  He was hypotensive bradycardic started on Levophed given 3 L of IV fluids and blood pressure has improved now with 90 systolic heart rate in the 80s 100% on ventilator.  Total downtime probably around 40 minutes with a significant amount of that time likely with hypoxia.  Hypoxia did improve while we were suctioning him and bagging him and prior to intubation but I do have fear for anoxic brain injury.  Will do infectious workup start broad-spectrum IV antibiotics check troponins.  EKG shows sinus rhythm.  Overall I do think this is likely an aspiration event.  His sister has been notified.  He is still full code per her.  She understands that he is being admitted to the ICU.  Blood gas showed a pH of 7.11 CO2 of 65.  Electrolytes overall unremarkable.  No major leukocytosis.  I reviewed interpreted images.  Chest x-ray shows may be some aspiration.  Emphysema changes.  Overall hemodynamically improved while on Levophed IV fluids to be admitted to the ICU for further management.  This chart was dictated using voice recognition software.  Despite best efforts to proofread,  errors can occur which can change the documentation meaning.         Final Clinical Impression(s) / ED Diagnoses Final diagnoses:  Cardiac arrest [facility])  Respiratory arrest [facility])    Rx / DC Orders ED Discharge Orders     None         Virgina Norfolk, DO 10/03/23 2017

## 2023-10-03 NOTE — Progress Notes (Signed)
  Subjective:  Patient ID: Ray Jackson, male    DOB: 12-25-1955,  MRN: [id number]  Chief Complaint  Patient presents with   RFC    " I just need a nail trim as my nails are awful"     68 y.o. male presents with the above complaint. History confirmed with patient. Patient presenting with pain related to dystrophic thickened elongated nails. Patient is unable to trim own nails related to nail dystrophy and/or mobility issues. Patient does not have a history of T2DM.   Objective:  Physical Exam: warm, good capillary refill nail exam onychomycosis of the toenails, onycholysis, and dystrophic nails DP pulses palpable, PT pulses palpable, and protective sensation intact Left Foot:  Pain with palpation of nails due to elongation and dystrophic growth.  Right Foot: Pain with palpation of nails due to elongation and dystrophic growth.   Assessment:   1. Pain due to onychomycosis of toenails of both feet        Plan:  Patient was evaluated and treated and all questions answered.    #Onychomycosis with pain  -Nails palliatively debrided as below. -Educated on self-care  Procedure: Nail Debridement Rationale: Pain Type of Debridement: manual, sharp debridement. Instrumentation: Nail nipper, rotary burr. Number of Nails: 10  Return in about 3 months (around 12/31/2023) for Routine Foot Care.         Barbaraann Share, DPM [facility]

## 2023-10-03 NOTE — ED Notes (Signed)
Pulses noted on doppler and with ultrasound by EDP at bedside

## 2023-10-04 ENCOUNTER — Inpatient Hospital Stay (HOSPITAL_COMMUNITY): Payer: Medicare Other

## 2023-10-04 DIAGNOSIS — I428 Other cardiomyopathies: Secondary | ICD-10-CM

## 2023-10-04 DIAGNOSIS — E43 Unspecified severe protein-calorie malnutrition: Secondary | ICD-10-CM | POA: Diagnosis not present

## 2023-10-04 DIAGNOSIS — E876 Hypokalemia: Secondary | ICD-10-CM

## 2023-10-04 DIAGNOSIS — N179 Acute kidney failure, unspecified: Secondary | ICD-10-CM

## 2023-10-04 DIAGNOSIS — J9601 Acute respiratory failure with hypoxia: Secondary | ICD-10-CM | POA: Diagnosis not present

## 2023-10-04 DIAGNOSIS — J690 Pneumonitis due to inhalation of food and vomit: Secondary | ICD-10-CM | POA: Diagnosis not present

## 2023-10-04 DIAGNOSIS — I469 Cardiac arrest, cause unspecified: Secondary | ICD-10-CM | POA: Diagnosis not present

## 2023-10-04 DIAGNOSIS — G9340 Encephalopathy, unspecified: Secondary | ICD-10-CM

## 2023-10-04 LAB — RAPID URINE DRUG SCREEN, HOSP PERFORMED
Amphetamines: NOT DETECTED
Barbiturates: NOT DETECTED
Benzodiazepines: NOT DETECTED
Cocaine: NOT DETECTED
Opiates: NOT DETECTED
Tetrahydrocannabinol: NOT DETECTED

## 2023-10-04 LAB — POCT I-STAT 7, (LYTES, BLD GAS, ICA,H+H)
Acid-Base Excess: 0 mmol/L (ref 0.0–2.0)
Bicarbonate: 24.3 mmol/L (ref 20.0–28.0)
Calcium, Ion: 1.25 mmol/L (ref 1.15–1.40)
HCT: 34 % — ABNORMAL LOW (ref 39.0–52.0)
Hemoglobin: 11.6 g/dL — ABNORMAL LOW (ref 13.0–17.0)
O2 Saturation: 98 %
Patient temperature: 36.9
Potassium: 3.6 mmol/L (ref 3.5–5.1)
Sodium: 144 mmol/L (ref 135–145)
TCO2: 25 mmol/L (ref 22–32)
pCO2 arterial: 37.9 mm[Hg] (ref 32–48)
pH, Arterial: 7.414 (ref 7.35–7.45)
pO2, Arterial: 99 mm[Hg] (ref 83–108)

## 2023-10-04 LAB — VITAMIN B12: Vitamin B-12: 1129 pg/mL — ABNORMAL HIGH (ref 180–914)

## 2023-10-04 LAB — ECHOCARDIOGRAM COMPLETE
Area-P 1/2: 2.91 cm2
Height: 69 in
S' Lateral: 2.9 cm
Single Plane A4C EF: 62.5 %
Weight: 2299.84 [oz_av]

## 2023-10-04 LAB — CBC
HCT: 36.6 % — ABNORMAL LOW (ref 39.0–52.0)
Hemoglobin: 11.9 g/dL — ABNORMAL LOW (ref 13.0–17.0)
MCH: 32 pg (ref 26.0–34.0)
MCHC: 32.5 g/dL (ref 30.0–36.0)
MCV: 98.4 fL (ref 80.0–100.0)
Platelets: 232 10*3/uL (ref 150–400)
RBC: 3.72 MIL/uL — ABNORMAL LOW (ref 4.22–5.81)
RDW: 13.3 % (ref 11.5–15.5)
WBC: 11.1 10*3/uL — ABNORMAL HIGH (ref 4.0–10.5)
nRBC: 0 % (ref 0.0–0.2)

## 2023-10-04 LAB — BASIC METABOLIC PANEL
Anion gap: 9 (ref 5–15)
BUN: 25 mg/dL — ABNORMAL HIGH (ref 8–23)
CO2: 24 mmol/L (ref 22–32)
Calcium: 9.2 mg/dL (ref 8.9–10.3)
Chloride: 112 mmol/L — ABNORMAL HIGH (ref 98–111)
Creatinine, Ser: 2.47 mg/dL — ABNORMAL HIGH (ref 0.61–1.24)
GFR, Estimated: 28 mL/min — ABNORMAL LOW (ref >60)
Glucose, Bld: 110 mg/dL — ABNORMAL HIGH (ref 70–99)
Potassium: 4.8 mmol/L (ref 3.5–5.1)
Sodium: 145 mmol/L (ref 135–145)

## 2023-10-04 LAB — MAGNESIUM
Magnesium: 1.8 mg/dL (ref 1.7–2.4)
Magnesium: 2.4 mg/dL (ref 1.7–2.4)

## 2023-10-04 LAB — GLUCOSE, CAPILLARY
Glucose-Capillary: 147 mg/dL — ABNORMAL HIGH (ref 70–99)
Glucose-Capillary: 164 mg/dL — ABNORMAL HIGH (ref 70–99)
Glucose-Capillary: 88 mg/dL (ref 70–99)
Glucose-Capillary: 100 mg/dL — ABNORMAL HIGH (ref 70–99)

## 2023-10-04 LAB — PHOSPHORUS
Phosphorus: 1.2 mg/dL — ABNORMAL LOW (ref 2.5–4.6)
Phosphorus: 1.8 mg/dL — ABNORMAL LOW (ref 2.5–4.6)

## 2023-10-04 LAB — LITHIUM LEVEL: Lithium Lvl: 0.25 mmol/L — ABNORMAL LOW (ref 0.60–1.20)

## 2023-10-04 MED ORDER — LIDOCAINE HCL URETHRAL/MUCOSAL 2 % EX GEL
1.0000 | Freq: Once | CUTANEOUS | Status: DC
  Filled 2023-10-04: qty 6

## 2023-10-04 MED ORDER — MAGNESIUM SULFATE 2 GM/50ML IV SOLN
2.0000 g | Freq: Once | INTRAVENOUS | Status: AC
Start: 2023-10-04 — End: 2023-10-04
  Administered 2023-10-04: 2 g via INTRAVENOUS
  Filled 2023-10-04: qty 50

## 2023-10-04 MED ORDER — DOCUSATE SODIUM 50 MG/5ML PO LIQD
100.0000 mg | Freq: Two times a day (BID) | ORAL | Status: DC
  Administered 2023-10-04 – 2023-10-05 (×3): 100 mg
  Filled 2023-10-04 (×3): qty 10

## 2023-10-04 MED ORDER — CHLORHEXIDINE GLUCONATE CLOTH 2 % EX PADS
6.0000 | MEDICATED_PAD | Freq: Every day | CUTANEOUS | Status: DC
  Administered 2023-10-05: 6 via TOPICAL

## 2023-10-04 MED ORDER — LABETALOL HCL 5 MG/ML IV SOLN: 10.0000 mg | Freq: Four times a day (QID) | INTRAVENOUS | Status: DC | PRN

## 2023-10-04 MED ORDER — ENOXAPARIN SODIUM 30 MG/0.3ML IJ SOSY
30.0000 mg | PREFILLED_SYRINGE | INTRAMUSCULAR | Status: DC
  Administered 2023-10-04: 30 mg via SUBCUTANEOUS
  Filled 2023-10-04: qty 0.3

## 2023-10-04 MED ORDER — LITHIUM CITRATE 300 MG/5 ML PO SYRP
125.0000 mg | Freq: Two times a day (BID) | ORAL | Status: DC
  Filled 2023-10-04: qty 2.1

## 2023-10-04 MED ORDER — THIAMINE MONONITRATE 100 MG PO TABS
100.0000 mg | ORAL_TABLET | Freq: Every day | ORAL | Status: DC
  Administered 2023-10-04 – 2023-10-05 (×2): 100 mg
  Filled 2023-10-04 (×2): qty 1

## 2023-10-04 MED ORDER — FAMOTIDINE 20 MG PO TABS
20.0000 mg | ORAL_TABLET | ORAL | Status: DC
Start: 2023-10-05 — End: 2023-10-05
  Administered 2023-10-05: 20 mg
  Filled 2023-10-04: qty 1

## 2023-10-04 MED ORDER — POLYETHYLENE GLYCOL 3350 17 G PO PACK
17.0000 g | PACK | Freq: Every day | ORAL | Status: DC
  Administered 2023-10-05: 17 g
  Filled 2023-10-04: qty 1

## 2023-10-04 MED ORDER — VITAL 1.5 CAL PO LIQD
1000.0000 mL | ORAL | Status: DC
  Administered 2023-10-04: 1000 mL

## 2023-10-04 MED ORDER — POTASSIUM CHLORIDE 10 MEQ/100ML IV SOLN
10.0000 meq | INTRAVENOUS | Status: AC
  Administered 2023-10-04 (×3): 10 meq via INTRAVENOUS
  Filled 2023-10-04 (×3): qty 100

## 2023-10-04 NOTE — H&P (Signed)
NAME:  Ray Jackson, MRN:  [id number], DOB:  1956-04-03, LOS: 1 ADMISSION DATE:  10/03/2023 CONSULTATION DATE:  10/03/2023 REFERRING MD:  Lockie Mola - EDP, CHIEF COMPLAINT:  Cardiac arrest   History of Present Illness:  68 year old man who presented to [facility] ED 2/13 via EMS for cardiac arrest. PMHx significant for HTN, COPD, chronic anemia 2/2 vitamin B12 deficiency, CKD stage 3a, BPH, schizophrenia (c/b lithium toxicity 10/2022), FTT/malnutrition.  History is obtained primarily from chart review and from EMS report. Patient is a resident of a group home and was found unresponsive by staff a few minutes after sitting down to eat dinner. CPR was initiated x 10 minutes with Epi administration x 2 and ROSC. Patient reportedly had pulses for ~25 minutes prior to arresting again en route with EMS. CPR reinitiated and Epi x 2 given by EMS. Patient was intubated in ED with ROSC. Of note, copious food debris in patient's trachea was noted by EDP on intubation and by RN on OGT placement. Labs were notable for WBC 11.7, Hgb 10.8 (baseline ~12), Plt 233. INR 1.2. Chem-8 with Na 142, K 3.2, CO2 23, iCal 1.29, Cr 2.20 (baseline 1.6-1.8). LA 7.1. Imaging pending.  PCCM consulted for ICU admission.  Pertinent Medical History:   Past Medical History:  Diagnosis Date   ARF (acute renal failure) (HCC) 10/02/2017   BPH (benign prostatic hyperplasia)    Constipation    COPD (chronic obstructive pulmonary disease) (HCC)    Dyspnea    Hypertension    Hypokalemia    Hyponatremia 10/02/2017   Noncompliance 11/05/2017   Schizophrenia (HCC)    Significant Hospital Events: Including procedures, antibiotic start and stop dates in addition to other pertinent events   2/13 - Presented to [facility] ED via EMS as a CPR in progress. CPR x 10 minutes (Epi x 2) in the field with ROSC. Pulses x 25 minutes then lost again en route to ED. Additional CPR + Epi x 2 by EMS. Intubated on ED arrival.  Interim History / Subjective:  Patient  came off of vasopressor support Remain afebrile  Objective:  Blood pressure 126/72, pulse 73, temperature 98.4 F (36.9 C), resp. rate (!) 28, height 5\' 9"  (1.753 m), weight 65.2 kg, SpO2 96%.    Vent Mode: PRVC FiO2 (%):  [50 %-100 %] 50 % Set Rate:  [18 bmp-28 bmp] 28 bmp Vt Set:  [500 mL-560 mL] 560 mL PEEP:  [5 cmH20] 5 cmH20 Plateau Pressure:  [14 cmH20] 14 cmH20   Intake/Output Summary (Last 24 hours) at 10/04/2023 8416 Last data filed at 10/04/2023 0700 Gross per 24 hour  Intake 2180.25 ml  Output 1725 ml  Net 455.25 ml   Filed Weights   10/03/23 2042 10/04/23 0259  Weight: 65.2 kg 65.2 kg    Physical Examination:   Physical exam: General: Crtitically ill-appearing elderly male, orally intubated HEENT: [place]/AT, eyes anicteric.  ETT and OGT in place Neuro: Eyes closed, does not open, not following commands.  Pupils dilated, nonreactive to light.  Positive cough and gag Chest: Reduced air entry at the bases bilaterally, no wheezes or rhonchi Heart: Regular rate and rhythm, no murmurs or gallops Abdomen: Soft, nondistended, bowel sounds present Skin: No rash  Labs and images reviewed  Resolved Hospital Problem List:    Assessment & Plan:  Status post PEA cardiac arrest likely in the setting of aspiration leading to hypoxia Continue TTM protocol Continue supportive care Continue telemetry monitoring Follow-up echocardiogram Will get EEG Initial head CT  is negative for acute findings  Acute encephalopathy, likely anoxic Patient is off sedation, he has no pupillary reflex Though he does have cough reflex Continue to monitor MRI brain in next few days  HTN Patient is off Levophed Continue as needed labetalol  Acute hypoxemic and hypercarbic respiratory failure Aspiration PNA COPD Continue lung protective ventilation VAP prevention bundle in place PAD protocol, he is off sedation Hypercapnia has cleared FiO2 was titrated down to 50% Continue IV  ceftriaxone Follow-up respiratory culture  AKI on CKD stage 3a BPH Hypokalemia Patient baseline serum creatinine is around 1.6 He presented with serum creatinine of 2.2 Monitor intake and output Avoid nephrotoxic agent Continue IV fluid Serum potassium is back 3.6, will supplement more, closely monitor electrolytes  Chronic anemia in the setting of vitamin B12 deficiency Monitor H&H and transfuse if less than 7  Schizophrenia Resident of a group home; patient has a legal guardian Advice worker). Will restart lithium, hold other antipsychotic meds  Demand cardiac ischemia due to CPR Echocardiogram is pending Serum troponin elevated to 683   Best Practice: (right click and "Reselect all SmartList Selections" daily)   Diet/type: NPO,  DVT prophylaxis: Subcu heparin GI prophylaxis: PPI Lines: N/A,  Foley:  Yes, and it is still needed Code Status:  full code Last date of multidisciplinary goals of care discussion [Pending - of note, patient has legal guardian [name] Witcher]  Labs:  CBC: Recent Labs  Lab 10/03/23 1913 10/03/23 1923 10/03/23 2014 10/04/23 0255 10/04/23 0333  WBC 11.7*  --   --  11.1*  --   NEUTROABS 5.8  --   --   --   --   HGB 10.8* 10.9*  11.2* 9.9* 11.9* 11.6*  HCT 36.7* 32.0*  33.0* 29.0* 36.6* 34.0*  MCV 109.2*  --   --  98.4  --   PLT 233  --   --  232  --    Basic Metabolic Panel: Recent Labs  Lab 10/03/23 1913 10/03/23 1923 10/03/23 1939 10/03/23 2014 10/04/23 0333  NA 142 143  142  --  143 144  K 3.4* 3.3*  3.2*  --  3.2* 3.6  CL 108 107  --   --   --   CO2 20*  --   --   --   --   GLUCOSE 238* 228*  --   --   --   BUN 15 19  --   --   --   CREATININE 2.23* 2.20*  --   --   --   CALCIUM 9.9  --   --   --   --   MG  --   --  2.2  --   --   PHOS  --   --  6.5*  --   --    GFR: Estimated Creatinine Clearance: 29.6 mL/min (A) (by C-G formula based on SCr of 2.2 mg/dL (H)). Recent Labs  Lab 10/03/23 1913 10/03/23 1923  10/03/23 1939 10/04/23 0255  PROCALCITON  --   --  0.10  --   WBC 11.7*  --   --  11.1*  LATICACIDVEN  --  7.1*  --   --    Liver Function Tests: Recent Labs  Lab 10/03/23 1913  AST 136*  ALT 98*  ALKPHOS 105  BILITOT 0.7  PROT 4.8*  ALBUMIN 2.5*   Recent Labs  Lab 10/03/23 1913  LIPASE 32   No results for input(s): "AMMONIA" in the last 168  hours.  ABG:    Component Value Date/Time   PHART 7.414 10/04/2023 0333   PCO2ART 37.9 10/04/2023 0333   PO2ART 99 10/04/2023 0333   HCO3 24.3 10/04/2023 0333   TCO2 25 10/04/2023 0333   ACIDBASEDEF 4.0 (H) 10/03/2023 2014   O2SAT 98 10/04/2023 0333    Coagulation Profile: Recent Labs  Lab 10/03/23 1913  INR 1.2   Cardiac Enzymes: No results for input(s): "CKTOTAL", "CKMB", "CKMBINDEX", "TROPONINI" in the last 168 hours.  HbA1C: Hemoglobin A1C  Date/Time Value Ref Range Status  04/06/2014 06:36 AM 5.1 4.2 - 6.3 % Final    Comment:    The American Diabetes Association recommends that a primary goal of therapy should be <7% and that physicians should reevaluate the treatment regimen in patients with HbA1c values consistently >8%.    Hgb A1c MFr Bld  Date/Time Value Ref Range Status  10/03/2023 07:52 PM 4.9 4.8 - 5.6 % Final    Comment:    (NOTE) Pre diabetes:          5.7%-6.4%  Diabetes:              >6.4%  Glycemic control for   <7.0% adults with diabetes   11/06/2017 07:20 AM 4.5 (L) 4.8 - 5.6 % Final    Comment:    (NOTE) Pre diabetes:          5.7%-6.4% Diabetes:              >6.4% Glycemic control for   <7.0% adults with diabetes    CBG: Recent Labs  Lab 10/03/23 1956 10/03/23 2101 10/03/23 2316 10/04/23 0321 10/04/23 0805  GLUCAP 187* 189* 236* 147* 164*    The patient is critically ill due to status post PEA cardiac arrest.  Critical care was necessary to treat or prevent imminent or life-threatening deterioration.  Critical care was time spent personally by me on the following  activities: development of treatment plan with patient and/or surrogate as well as nursing, discussions with consultants, evaluation of patient's response to treatment, examination of patient, obtaining history from patient or surrogate, ordering and performing treatments and interventions, ordering and review of laboratory studies, ordering and review of radiographic studies, pulse oximetry, re-evaluation of patient's condition and participation in multidisciplinary rounds.   During this encounter critical care time was devoted to patient care services described in this note for .     Cheri Fowler, MD [facility] See Amion for pager If no response to pager, please call [phone] until 7pm After 7pm, Please call E-link [phone]

## 2023-10-04 NOTE — Procedures (Signed)
Foley Catheter Placement Note  Indications: 68 y.o. male with urinary retention  Pre-operative Diagnosis: Urinary retention  Post-operative Diagnosis: Same  Surgeon: Zettie Pho, MD  Assistants: None  Procedure Details  Patient was placed in the supine position, prepped with Betadine and draped in the usual sterile fashion.  I gently inserted the flexible cystoscope and surveyed the urethra.  At the base of the penis, there was noted to be a pinpoint stricture that was approximately 5 [place] in size.  I placed a sensor wire through the opening, removed my cystoscope while keeping the sensor wire in place.  I then placed a 5 [place] open-ended ureteral catheter over the sensor wire, and remove the sensor wire.  This confirmed correct placement in the bladder with urine output from the 5 [place] open-ended ureteral stent.  I replaced a sensor wire, remove the 5 [place] open-ended ureteral stent.  I then took urethral dilators and subsequently dilated the stricture, starting with 8 [place] and working my way up to 24 [place].  I then utilized an 52 French catheter, and placed this over my sensor wire into the bladder.  I removed the sensor wire.  The balloon port had 10 cc of sterile water put into it, and the catheter was attached to drainage bag.  There is immediate return of approximately 1 L of urine with a tinge of pink.               Complications: None; patient tolerated the procedure well.  Plan:   1.  Continue Foley catheter to drainage for minimum of 7 days days due to greater than 1000 mL of urine returned with catheter placement and mild bladder stretch injury 2.  Pending ultimate disposition, patient can either follow-up outpatient or undergo trial of void while in the hospital.       Attending Attestation: Dr. Linard Millers was available.   Roby Lofts, MD Resident Physician [facility]

## 2023-10-04 NOTE — Progress Notes (Signed)
   10/04/23 1440  Spiritual Encounters  Type of Visit Initial  Conversation partners present during encounter Nurse  Reason for visit Urgent spiritual support  OnCall Visit No   Request to support family as they needed to make a decision on patient's care. Decision was made. Family left for after decision.

## 2023-10-04 NOTE — Consult Note (Signed)
Urology Consult Note   Requesting Attending Physician:  Patrici Ranks, MD Service Providing Consult: Urology  Consulting Attending: Brendolyn Patty  Reason for Consult:  urinary retention  HPI: Ray Jackson is seen in consultation for reasons noted above at the request of Albustami, Flonnie Hailstone, MD for evaluation of urinary retention.  This is a 68 year old male with past medical history significant for BPH status post UroLift (2019), CKD, COPD who presented after cardiac arrest with ROSC in the field.  Currently intubated and on mechanical ventilation.  Urology was consulted after inability to place Foley catheter with bladder scan showing greater than 1 L in the bladder.   Past Medical History: Past Medical History:  Diagnosis Date   ARF (acute renal failure) (HCC) 10/02/2017   BPH (benign prostatic hyperplasia)    Constipation    COPD (chronic obstructive pulmonary disease) (HCC)    Dyspnea    Hypertension    Hypokalemia    Hyponatremia 10/02/2017   Noncompliance 11/05/2017   Schizophrenia (HCC)     Past Surgical History:  Past Surgical History:  Procedure Laterality Date   CYSTOSCOPY WITH INSERTION OF UROLIFT N/A 03/24/2018   Procedure: CYSTOSCOPY WITH INSERTION OF UROLIFT;  Surgeon: Malen Gauze, MD;  Location: [facility];  Service: Urology;  Laterality: N/A;   TONSILLECTOMY      Medication: Current Facility-Administered Medications  Medication Dose Route Frequency Provider Last Rate Last Admin   cefTRIAXone (ROCEPHIN) 1 g in sodium chloride 0.9 % 100 mL IVPB  1 g Intravenous Q24H Albustami, Flonnie Hailstone, MD       Chlorhexidine Gluconate Cloth 2 % PADS 6 each  6 each Topical Daily Patrici Ranks, MD   6 each at 10/03/23 2100   docusate sodium (COLACE) capsule 100 mg  100 mg Oral BID Patrici Ranks, MD   100 mg at 10/03/23 2224   enoxaparin (LOVENOX) injection 40 mg  40 mg Subcutaneous Q24H Patrici Ranks, MD   40 mg at 10/03/23 2223   famotidine (PEPCID) tablet  20 mg  20 mg Per Tube QHS Patrici Ranks, MD   20 mg at 10/03/23 2224   insulin aspart (novoLOG) injection 0-9 Units  0-9 Units Subcutaneous Q4H Patrici Ranks, MD   1 Units at 10/04/23 0324   ipratropium-albuterol (DUONEB) 0.5-2.5 (3) MG/3ML nebulizer solution 3 mL  3 mL Nebulization QID Patrici Ranks, MD   3 mL at 10/03/23 2054   lactated ringers infusion   Intravenous Continuous Curatolo, Adam, DO 125 mL/hr at 10/04/23 0513 New Bag at 10/04/23 0513   lidocaine (XYLOCAINE) 2 % jelly 1 Application  1 Application Urethral Once Paliwal, Aditya, MD       norepinephrine (LEVOPHED) 4mg  in (0.016 mg/mL) premix infusion  0-40 mcg/min Intravenous Continuous Curatolo, Adam, DO   Stopped at 10/04/23 0132   Oral care mouth rinse  15 mL Mouth Rinse Q2H Albustami, Flonnie Hailstone, MD   15 mL at 10/04/23 0322   Oral care mouth rinse  15 mL Mouth Rinse PRN Albustami, Flonnie Hailstone, MD       polyethylene glycol (MIRALAX / GLYCOLAX) packet 17 g  17 g Oral Daily Patrici Ranks, MD   17 g at 10/03/23 2224    Allergies: No Known Allergies  Social History: Social History   Tobacco Use   Smoking status: Every Day    Current packs/day: 0.50    Types: Cigarettes   Smokeless tobacco: Never  Vaping Use  Vaping status: Never Used  Substance Use Topics   Alcohol use: No   Drug use: No    Family History History reviewed. No pertinent family history.  Review of Systems 10 systems were reviewed and are negative except as noted specifically in the HPI.  Objective   Vital signs in last 24 hours: BP 117/76   Pulse 68   Temp 98.2 F (36.8 C)   Resp (!) 28   Ht 5\' 9"  (1.753 m)   Wt 65.2 kg   SpO2 97%   BMI 21.23 kg/m   Physical Exam General: lying in stretcher HEENT: [place]/AT, EOMI, MMM Pulmonary: mechanically ventilated Abdomen: Soft, NTTP GU: Foley catheter in place with yellow urine, no CVA tenderness Extremities: warm and well perfused Neuro: Appropriate, no focal neurological  deficits  Most Recent Labs: Lab Results  Component Value Date   WBC 11.1 (H) 10/04/2023   HGB 11.6 (L) 10/04/2023   HCT 34.0 (L) 10/04/2023   PLT 232 10/04/2023    Lab Results  Component Value Date   NA 144 10/04/2023   K 3.6 10/04/2023   CL 107 10/03/2023   CO2 20 (L) 10/03/2023   BUN 19 10/03/2023   CREATININE 2.20 (H) 10/03/2023   CALCIUM 9.9 10/03/2023   MG 2.2 10/03/2023   PHOS 6.5 (H) 10/03/2023    Lab Results  Component Value Date   INR 1.2 10/03/2023   APTT 33 10/03/2023     Urine Culture: @[id number] (laburin,org,r9620,r9621)@   IMAGING: CT HEAD WO CONTRAST Result Date: 10/03/2023 CLINICAL DATA:  Mental status change cardiac arrest EXAM: CT HEAD WITHOUT CONTRAST TECHNIQUE: Contiguous axial images were obtained from the base of the skull through the vertex without intravenous contrast. RADIATION DOSE REDUCTION: This exam was performed according to the departmental dose-optimization program which includes automated exposure control, adjustment of the mA and/or kV according to patient size and/or use of iterative reconstruction technique. COMPARISON:  None Available. FINDINGS: Brain: No acute territorial infarction, hemorrhage, or intracranial mass. Mild chronic small vessel ischemic changes of the white matter. Nonenlarged ventricles Vascular: No hyperdense vessels.  Carotid vascular calcification Skull: Normal. Negative for fracture or focal lesion. Sinuses/Orbits: Mucosal thickening in the sinuses. Moderate fluid and debris in the posterior nasal pharynx and the oropharynx. Other: None IMPRESSION: 1. No CT evidence for acute intracranial abnormality. 2. Mild chronic small vessel ischemic changes of the white matter. 3. Moderate fluid and debris in the posterior nasal pharynx and oropharynx. Electronically Signed   By: Jasmine Pang M.D.   On: 10/03/2023 20:42   DG Abd Portable 1V Result Date: 10/03/2023 CLINICAL DATA:  Enteric catheter placement EXAM: PORTABLE ABDOMEN -  1 VIEW COMPARISON:  10/03/2023 FINDINGS: Supine frontal view of the abdomen and pelvis excludes the right hemidiaphragm by collimation. Enteric catheter passes below diaphragm, tip overlying gastric body with side port projecting in the region of the gastroesophageal junction. Moderate gaseous distention of the stomach. No bowel obstruction or ileus. No masses or abnormal calcifications. IMPRESSION: 1. Enteric catheter tip projecting over gastric body, with side port projecting at the gastroesophageal junction. 2. Moderate gaseous distention of the stomach, likely due to recent intubation and resuscitation efforts. 3. No bowel obstruction or ileus. Electronically Signed   By: Sharlet Salina M.D.   On: 10/03/2023 20:28   DG Chest Portable 1 View Result Date: 10/03/2023 CLINICAL DATA:  Posterior arrest EXAM: PORTABLE CHEST 1 VIEW COMPARISON:  10/22/2022 FINDINGS: Endotracheal tube tip about 5.7 cm superior to the carina. Enteric tube  tip below the diaphragm but incompletely visualized. No consolidation or effusion. Stable cardiomediastinal silhouette. No pneumothorax. Emphysema. Mild diffuse increased interstitial opacity in the upper lobes. Old right-sided rib fracture IMPRESSION: 1. Endotracheal tube tip about 5.7 cm superior to the carina. Enteric tube tip below the diaphragm but incompletely visualized. 2. Emphysema. Mild diffuse increased interstitial opacity in the upper lobes, question mild noncardiogenic edema or infection/inflammation. Electronically Signed   By: Jasmine Pang M.D.   On: 10/03/2023 20:04    ------  Assessment:  68 y.o. male with BPH status post UroLift in 2019.  Patient currently in the cardiac ICU after cardiac arrest, found to have urinary retention.  Please see procedure note for full details.  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  Recommendations: 1.  Continue Foley catheter to drainage for minimum of 7 days days due to  greater than 1000 mL of urine returned with catheter placement and mild bladder stretch injury 2.  Pending ultimate disposition, patient can either follow-up outpatient or undergo trial of void while in the hospital. 3.  Patient does not need any periprocedural antibiotics given that patient is currently on broad-spectrum antibiotics for rule out sepsis   Roby Lofts, MD Resident [facility]ician [facility]    Thank you for this consult. Please contact the urology consult pager with any further questions/concerns.

## 2023-10-04 NOTE — Progress Notes (Signed)
X2 Coude Foley catheter attempts with lots of resistance,no urine return.   Dominica Severin  RN

## 2023-10-04 NOTE — Progress Notes (Addendum)
Initial Nutrition Assessment  DOCUMENTATION CODES:   Severe malnutrition in context of chronic illness  INTERVENTION:   Initiate enteral nutrition via OG tube: - Start Vital 1.5 @ 20 mL/hr and advance rate by 10 mL every 12 hours to goal rate of 50 mL/hr (1200 mL/day)  Tube feeding regimen at goal rate provides 1800 kcal, 81 grams of protein, and 917 ml of H2O.  Monitor magnesium, potassium, and phosphorus every 12 hours for at least 6 occurrences. MD to replete as needed as pt is at risk for refeeding syndrome given severe malnutrition.  - Thiamine 100 mg daily per tube x 5 days due to refeeding risk  NUTRITION DIAGNOSIS:   Severe Malnutrition related to chronic illness (COPD, CKD) as evidenced by severe fat depletion, severe muscle depletion.  GOAL:   Patient will meet greater than or equal to 90% of their needs  MONITOR:   Vent status, Labs, Weight trends, TF tolerance, I & O's  REASON FOR ASSESSMENT:   Ventilator, Consult Enteral/tube feeding initiation and management  ASSESSMENT:   68 year old male who presented to the ED on 2/13 after being found unresponsive. PMH of HTN, COPD, chronic anemia 2/2 vitamin B12 deficiency, CKD stage IIIa, BPH s/p UroLift (2019), schizophrenia, FTT/malnutrition. Pt admitted with shock post cardiac arrest.  Pt admitted s/p PEA arrest likely in the setting of aspiration leading to hypoxia.  Consult received for enteral nutrition initiation and management. Pt with OG tube tip projecting over the gastric body with side port projecting in the region of the GE junction per abdominal x-ray yesterday. Per notes, copious food debris in pt's trachea noted by EDP on intubation and by RN on OG tube placement.  Discussed pt with RN at bedside. Pt is off sedation with no pupillary reflex but does have a cough reflex. RN reports family en route to hospital. Pt is severely malnourished and at high risk for refeeding. Will start tube feeds at trickle rate  and slowly advance to goal.  Unable to obtain diet and weight history at this time. Per weight history in chart, weight up 3.5 kg compared to weights on 06/27/23 and 10/23/22.  Patient is currently intubated on ventilator support. Temp (24hrs), Avg:95.9 F (35.5 C), Min:88.5 F (31.4 C), Max:99.5 F (37.5 C)  Drips: LR: 125 mL/hr  Medications reviewed and include: colace, pepcid, SSI every 4 hours, miralax, IV abx, IV KCl 10 mEq x 3  Labs reviewed: creatinine 2.20, phosphorus 6.5, elevated LFTs, lactic acid 7.1, vitamin B12 1129, WBC 11.1  UOP: 1500 mL x 24 hours OGT: 225 mL x 24 hours I/O's: +455 mL since admit  NUTRITION - FOCUSED PHYSICAL EXAM:  Flowsheet Row Most Recent Value  Orbital Region Severe depletion  Upper Arm Region Moderate depletion  Thoracic and Lumbar Region Severe depletion  Buccal Region Severe depletion  Temple Region Severe depletion  Clavicle Bone Region Severe depletion  Clavicle and Acromion Bone Region Severe depletion  Scapular Bone Region Unable to assess  Dorsal Hand Moderate depletion  Patellar Region Severe depletion  Anterior Thigh Region Severe depletion  Posterior Calf Region Severe depletion  Edema (RD Assessment) None  Hair Reviewed  Eyes Reviewed  Mouth Reviewed  Skin Reviewed  Nails Reviewed    Diet Order:   Diet Order             Diet NPO time specified Except for: Sips with Meds  Diet effective now  EDUCATION NEEDS:   Not appropriate for education at this time  Skin:  Skin Assessment: Reviewed RN Assessment (skin tear R thigh)  Last BM:  no documented BM  Height:   Ht Readings from Last 1 Encounters:  10/03/23 5\' 9"  (1.753 m)    Weight:   Wt Readings from Last 1 Encounters:  10/04/23 65.2 kg    BMI:  Body mass index is 21.23 kg/m.  Estimated Nutritional Needs:   Kcal:  1750-1950  Protein:  80-95 grams  Fluid:  1.7-1.9 L    Mertie Clause, MS, RD, LDN Registered Dietitian  II Please see AMiON for contact information.

## 2023-10-05 ENCOUNTER — Inpatient Hospital Stay (HOSPITAL_COMMUNITY): Payer: Medicare Other

## 2023-10-05 DIAGNOSIS — A419 Sepsis, unspecified organism: Secondary | ICD-10-CM

## 2023-10-05 DIAGNOSIS — I469 Cardiac arrest, cause unspecified: Secondary | ICD-10-CM | POA: Diagnosis not present

## 2023-10-05 DIAGNOSIS — E43 Unspecified severe protein-calorie malnutrition: Secondary | ICD-10-CM | POA: Diagnosis not present

## 2023-10-05 DIAGNOSIS — R6521 Severe sepsis with septic shock: Secondary | ICD-10-CM

## 2023-10-05 DIAGNOSIS — J690 Pneumonitis due to inhalation of food and vomit: Secondary | ICD-10-CM | POA: Diagnosis not present

## 2023-10-05 DIAGNOSIS — J9601 Acute respiratory failure with hypoxia: Secondary | ICD-10-CM | POA: Diagnosis not present

## 2023-10-05 DIAGNOSIS — I5032 Chronic diastolic (congestive) heart failure: Secondary | ICD-10-CM

## 2023-10-05 LAB — POCT I-STAT 7, (LYTES, BLD GAS, ICA,H+H)
Acid-Base Excess: 1 mmol/L (ref 0.0–2.0)
Acid-base deficit: 1 mmol/L (ref 0.0–2.0)
Acid-base deficit: 1 mmol/L (ref 0.0–2.0)
Bicarbonate: 27.9 mmol/L (ref 20.0–28.0)
Bicarbonate: 26.7 mmol/L (ref 20.0–28.0)
Bicarbonate: 27.3 mmol/L (ref 20.0–28.0)
Calcium, Ion: 1.46 mmol/L — ABNORMAL HIGH (ref 1.15–1.40)
Calcium, Ion: 1.37 mmol/L (ref 1.15–1.40)
Calcium, Ion: 1.34 mmol/L (ref 1.15–1.40)
HCT: 31 % — ABNORMAL LOW (ref 39.0–52.0)
HCT: 36 % — ABNORMAL LOW (ref 39.0–52.0)
HCT: 36 % — ABNORMAL LOW (ref 39.0–52.0)
Hemoglobin: 10.5 g/dL — ABNORMAL LOW (ref 13.0–17.0)
Hemoglobin: 12.2 g/dL — ABNORMAL LOW (ref 13.0–17.0)
Hemoglobin: 12.2 g/dL — ABNORMAL LOW (ref 13.0–17.0)
O2 Saturation: 86 %
O2 Saturation: 87 %
O2 Saturation: 82 %
Patient temperature: 36
Patient temperature: 36.5
Patient temperature: 36.5
Potassium: 3 mmol/L — ABNORMAL LOW (ref 3.5–5.1)
Potassium: 3.8 mmol/L (ref 3.5–5.1)
Potassium: 4.7 mmol/L (ref 3.5–5.1)
Sodium: 157 mmol/L — ABNORMAL HIGH (ref 135–145)
Sodium: 153 mmol/L — ABNORMAL HIGH (ref 135–145)
Sodium: 153 mmol/L — ABNORMAL HIGH (ref 135–145)
TCO2: 30 mmol/L (ref 22–32)
TCO2: 29 mmol/L (ref 22–32)
TCO2: 29 mmol/L (ref 22–32)
pCO2 arterial: 51.7 mm[Hg] — ABNORMAL HIGH (ref 32–48)
pCO2 arterial: 59 mm[Hg] — ABNORMAL HIGH (ref 32–48)
pCO2 arterial: 61.7 mm[Hg] — ABNORMAL HIGH (ref 32–48)
pH, Arterial: 7.335 — ABNORMAL LOW (ref 7.35–7.45)
pH, Arterial: 7.261 — ABNORMAL LOW (ref 7.35–7.45)
pH, Arterial: 7.252 — ABNORMAL LOW (ref 7.35–7.45)
pO2, Arterial: 53 mm[Hg] — ABNORMAL LOW (ref 83–108)
pO2, Arterial: 60 mm[Hg] — ABNORMAL LOW (ref 83–108)
pO2, Arterial: 53 mm[Hg] — ABNORMAL LOW (ref 83–108)

## 2023-10-05 LAB — CBC WITH DIFFERENTIAL/PLATELET
Abs Immature Granulocytes: 0.02 10*3/uL (ref 0.00–0.07)
Abs Immature Granulocytes: 0 10*3/uL (ref 0.00–0.07)
Basophils Absolute: 0 10*3/uL (ref 0.0–0.1)
Basophils Absolute: 0 10*3/uL (ref 0.0–0.1)
Basophils Relative: 1 %
Basophils Relative: 0 %
Eosinophils Absolute: 0.1 10*3/uL (ref 0.0–0.5)
Eosinophils Absolute: 0.1 10*3/uL (ref 0.0–0.5)
Eosinophils Relative: 1 %
Eosinophils Relative: 1 %
HCT: 33 % — ABNORMAL LOW (ref 39.0–52.0)
HCT: 40.5 % (ref 39.0–52.0)
Hemoglobin: 10.8 g/dL — ABNORMAL LOW (ref 13.0–17.0)
Hemoglobin: 12.5 g/dL — ABNORMAL LOW (ref 13.0–17.0)
Immature Granulocytes: 0 %
Lymphocytes Relative: 18 %
Lymphocytes Relative: 8 %
Lymphs Abs: 1.2 10*3/uL (ref 0.7–4.0)
Lymphs Abs: 0.8 10*3/uL (ref 0.7–4.0)
MCH: 32.3 pg (ref 26.0–34.0)
MCH: 32 pg (ref 26.0–34.0)
MCHC: 32.7 g/dL (ref 30.0–36.0)
MCHC: 30.9 g/dL (ref 30.0–36.0)
MCV: 98.8 fL (ref 80.0–100.0)
MCV: 103.6 fL — ABNORMAL HIGH (ref 80.0–100.0)
Monocytes Absolute: 0.4 10*3/uL (ref 0.1–1.0)
Monocytes Absolute: 0.6 10*3/uL (ref 0.1–1.0)
Monocytes Relative: 7 %
Monocytes Relative: 6 %
Neutro Abs: 4.7 10*3/uL (ref 1.7–7.7)
Neutro Abs: 8.6 10*3/uL — ABNORMAL HIGH (ref 1.7–7.7)
Neutrophils Relative %: 73 %
Neutrophils Relative %: 85 %
Platelets: 206 10*3/uL (ref 150–400)
Platelets: 232 10*3/uL (ref 150–400)
RBC: 3.34 MIL/uL — ABNORMAL LOW (ref 4.22–5.81)
RBC: 3.91 MIL/uL — ABNORMAL LOW (ref 4.22–5.81)
RDW: 14.3 % (ref 11.5–15.5)
RDW: 14.9 % (ref 11.5–15.5)
WBC: 6.4 10*3/uL (ref 4.0–10.5)
WBC: 10.1 10*3/uL (ref 4.0–10.5)
nRBC: 0 % (ref 0.0–0.2)
nRBC: 0 % (ref 0.0–0.2)
nRBC: 0 /100{WBCs}

## 2023-10-05 LAB — COMPREHENSIVE METABOLIC PANEL
ALT: 76 U/L — ABNORMAL HIGH (ref 0–44)
AST: 49 U/L — ABNORMAL HIGH (ref 15–41)
Albumin: 2 g/dL — ABNORMAL LOW (ref 3.5–5.0)
Alkaline Phosphatase: 77 U/L (ref 38–126)
Anion gap: 13 (ref 5–15)
BUN: 27 mg/dL — ABNORMAL HIGH (ref 8–23)
CO2: 25 mmol/L (ref 22–32)
Calcium: 9.2 mg/dL (ref 8.9–10.3)
Chloride: 116 mmol/L — ABNORMAL HIGH (ref 98–111)
Creatinine, Ser: 2.6 mg/dL — ABNORMAL HIGH (ref 0.61–1.24)
GFR, Estimated: 26 mL/min — ABNORMAL LOW (ref >60)
Glucose, Bld: 105 mg/dL — ABNORMAL HIGH (ref 70–99)
Potassium: 4 mmol/L (ref 3.5–5.1)
Sodium: 154 mmol/L — ABNORMAL HIGH (ref 135–145)
Total Bilirubin: 0.6 mg/dL (ref 0.0–1.2)
Total Protein: 5 g/dL — ABNORMAL LOW (ref 6.5–8.1)

## 2023-10-05 LAB — FIBRINOGEN: Fibrinogen: 627 mg/dL — ABNORMAL HIGH (ref 210–475)

## 2023-10-05 LAB — URINALYSIS, ROUTINE W REFLEX MICROSCOPIC
Bilirubin Urine: NEGATIVE
Glucose, UA: NEGATIVE mg/dL
Ketones, ur: NEGATIVE mg/dL
Nitrite: NEGATIVE
Protein, ur: 30 mg/dL — AB
RBC / HPF: >50 RBC/hpf (ref 0–5)
Specific Gravity, Urine: 1.01 (ref 1.005–1.030)
pH: 7 (ref 5.0–8.0)

## 2023-10-05 LAB — BASIC METABOLIC PANEL
Anion gap: 7 (ref 5–15)
BUN: 27 mg/dL — ABNORMAL HIGH (ref 8–23)
CO2: 26 mmol/L (ref 22–32)
Calcium: 9.2 mg/dL (ref 8.9–10.3)
Chloride: 119 mmol/L — ABNORMAL HIGH (ref 98–111)
Creatinine, Ser: 2.47 mg/dL — ABNORMAL HIGH (ref 0.61–1.24)
GFR, Estimated: 28 mL/min — ABNORMAL LOW (ref >60)
Glucose, Bld: 101 mg/dL — ABNORMAL HIGH (ref 70–99)
Potassium: 3.7 mmol/L (ref 3.5–5.1)
Sodium: 152 mmol/L — ABNORMAL HIGH (ref 135–145)

## 2023-10-05 LAB — PHOSPHORUS
Phosphorus: 2.4 mg/dL — ABNORMAL LOW (ref 2.5–4.6)
Phosphorus: 4.4 mg/dL (ref 2.5–4.6)

## 2023-10-05 LAB — GLUCOSE, CAPILLARY
Glucose-Capillary: 105 mg/dL — ABNORMAL HIGH (ref 70–99)
Glucose-Capillary: 96 mg/dL (ref 70–99)
Glucose-Capillary: 106 mg/dL — ABNORMAL HIGH (ref 70–99)
Glucose-Capillary: 107 mg/dL — ABNORMAL HIGH (ref 70–99)
Glucose-Capillary: 95 mg/dL (ref 70–99)

## 2023-10-05 LAB — PROTIME-INR
INR: 1.3 — ABNORMAL HIGH (ref 0.8–1.2)
Prothrombin Time: 16.4 s — ABNORMAL HIGH (ref 11.4–15.2)

## 2023-10-05 LAB — MAGNESIUM
Magnesium: 2.5 mg/dL — ABNORMAL HIGH (ref 1.7–2.4)
Magnesium: 2 mg/dL (ref 1.7–2.4)

## 2023-10-05 LAB — BILIRUBIN, DIRECT: Bilirubin, Direct: 0.2 mg/dL (ref 0.0–0.2)

## 2023-10-05 LAB — APTT: aPTT: 32 s (ref 24–36)

## 2023-10-05 LAB — SARS CORONAVIRUS 2 BY RT PCR: SARS Coronavirus 2 by RT PCR: NEGATIVE

## 2023-10-05 MED ORDER — ORAL CARE MOUTH RINSE: 15.0000 mL | OROMUCOSAL | Status: DC | PRN

## 2023-10-05 MED ORDER — ORAL CARE MOUTH RINSE
15.0000 mL | OROMUCOSAL | Status: DC
  Administered 2023-10-05 (×2): 15 mL via OROMUCOSAL

## 2023-10-05 MED ORDER — ACETAMINOPHEN 650 MG RE SUPP: 650.0000 mg | Freq: Four times a day (QID) | RECTAL | Status: DC | PRN

## 2023-10-05 MED ORDER — LACTATED RINGERS IV BOLUS
1000.0000 mL | Freq: Once | INTRAVENOUS | Status: AC
  Administered 2023-10-05: 1000 mL via INTRAVENOUS

## 2023-10-05 MED ORDER — NOREPINEPHRINE 4 MG/250ML-% IV SOLN
0.0000 ug/min | INTRAVENOUS | Status: DC
  Administered 2023-10-05: 17 ug/min via INTRAVENOUS
  Administered 2023-10-05: 33 ug/min via INTRAVENOUS
  Filled 2023-10-05 (×2): qty 250

## 2023-10-05 MED ORDER — MIDAZOLAM HCL 2 MG/2ML IJ SOLN
2.0000 mg | INTRAMUSCULAR | Status: DC | PRN
  Administered 2023-10-05 (×2): 4 mg via INTRAVENOUS
  Filled 2023-10-05 (×2): qty 4

## 2023-10-05 MED ORDER — SODIUM CHLORIDE 0.9 % IV SOLN: INTRAVENOUS | Status: DC | PRN

## 2023-10-05 MED ORDER — SODIUM CHLORIDE 0.9 % IV SOLN: INTRAVENOUS | Status: DC

## 2023-10-05 MED ORDER — SODIUM CHLORIDE 0.9 % IV SOLN
3.0000 g | Freq: Two times a day (BID) | INTRAVENOUS | Status: DC
  Administered 2023-10-05: 3 g via INTRAVENOUS
  Filled 2023-10-05: qty 8

## 2023-10-05 MED ORDER — MORPHINE SULFATE (PF) 2 MG/ML IV SOLN
2.0000 mg | INTRAVENOUS | Status: DC | PRN
  Administered 2023-10-05 (×2): 4 mg via INTRAVENOUS
  Filled 2023-10-05 (×2): qty 2

## 2023-10-05 MED ORDER — PIPERACILLIN-TAZOBACTAM 3.375 G IVPB: 3.3750 g | Freq: Three times a day (TID) | INTRAVENOUS | Status: DC

## 2023-10-05 MED ORDER — PIPERACILLIN-TAZOBACTAM 3.375 G IVPB 30 MIN
3.3750 g | Freq: Once | INTRAVENOUS | Status: AC
  Administered 2023-10-05: 3.375 g via INTRAVENOUS

## 2023-10-05 MED ORDER — VASOPRESSIN 20 UNITS/100 ML INFUSION FOR SHOCK
0.0000 [IU]/min | INTRAVENOUS | Status: DC
  Administered 2023-10-05: 0.03 [IU]/min via INTRAVENOUS
  Filled 2023-10-05: qty 100

## 2023-10-05 MED ORDER — GLYCOPYRROLATE 1 MG PO TABS: 1.0000 mg | ORAL_TABLET | ORAL | Status: DC | PRN

## 2023-10-05 MED ORDER — LACTATED RINGERS IV BOLUS
500.0000 mL | Freq: Once | INTRAVENOUS | Status: AC
  Administered 2023-10-05: 500 mL via INTRAVENOUS

## 2023-10-05 MED ORDER — NOREPINEPHRINE 4 MG/250ML-% IV SOLN
0.0000 ug/min | INTRAVENOUS | Status: DC
Start: 2023-10-05 — End: 2023-10-05
  Administered 2023-10-05: 2 ug/min via INTRAVENOUS

## 2023-10-05 MED ORDER — VASOPRESSIN 20 UNITS/100 ML INFUSION FOR SHOCK
INTRAVENOUS | Status: DC
Start: 2023-10-05 — End: 2023-10-05
  Filled 2023-10-05: qty 100

## 2023-10-05 MED ORDER — NOREPINEPHRINE 4 MG/250ML-% IV SOLN
INTRAVENOUS | Status: AC
  Filled 2023-10-05: qty 250

## 2023-10-05 MED ORDER — POTASSIUM PHOSPHATES 15 MMOLE/5ML IV SOLN
30.0000 mmol | Freq: Once | INTRAVENOUS | Status: DC
  Administered 2023-10-05: 30 mmol via INTRAVENOUS
  Filled 2023-10-05: qty 10

## 2023-10-05 MED ORDER — VANCOMYCIN HCL 1250 MG/250ML IV SOLN
1250.0000 mg | INTRAVENOUS | Status: DC
  Administered 2023-10-05: 1250 mg via INTRAVENOUS
  Filled 2023-10-05: qty 250

## 2023-10-05 MED ORDER — NOREPINEPHRINE 4 MG/250ML-% IV SOLN
INTRAVENOUS | Status: AC
Start: 2023-10-05 — End: 2023-10-05
  Filled 2023-10-05: qty 250

## 2023-10-05 MED ORDER — SODIUM CHLORIDE 0.9 % IV SOLN
250.0000 mL | INTRAVENOUS | Status: DC
  Administered 2023-10-05: 250 mL via INTRAVENOUS

## 2023-10-05 MED ORDER — POLYVINYL ALCOHOL 1.4 % OP SOLN: 1.0000 [drp] | Freq: Four times a day (QID) | OPHTHALMIC | Status: DC | PRN

## 2023-10-05 MED ORDER — INSULIN ASPART 100 UNIT/ML IJ SOLN: 0.0000 [IU] | INTRAMUSCULAR | Status: DC

## 2023-10-05 MED ORDER — VASOPRESSIN 20 UNITS/100 ML INFUSION FOR SHOCK
INTRAVENOUS | Status: AC
  Filled 2023-10-05: qty 100

## 2023-10-05 MED ORDER — PIPERACILLIN-TAZOBACTAM 3.375 G IVPB
3.3750 g | Freq: Three times a day (TID) | INTRAVENOUS | Status: DC
  Filled 2023-10-05: qty 50

## 2023-10-05 MED ORDER — NOREPINEPHRINE 4 MG/250ML-% IV SOLN
0.0000 ug/min | INTRAVENOUS | Status: DC
  Administered 2023-10-05: 40 ug/min via INTRAVENOUS

## 2023-10-05 MED ORDER — GLYCOPYRROLATE 0.2 MG/ML IJ SOLN: 0.2000 mg | INTRAMUSCULAR | Status: DC | PRN

## 2023-10-05 MED ORDER — ACETAMINOPHEN 325 MG PO TABS: 650.0000 mg | ORAL_TABLET | Freq: Four times a day (QID) | ORAL | Status: DC | PRN

## 2023-10-05 MED ORDER — NOREPINEPHRINE 16 MG/250ML-% IV SOLN
0.0000 ug/min | INTRAVENOUS | Status: DC
  Administered 2023-10-05: 36 ug/min via INTRAVENOUS
  Filled 2023-10-05: qty 250

## 2023-10-05 MED ORDER — HYDROCORTISONE SOD SUC (PF) 100 MG IJ SOLR
100.0000 mg | Freq: Two times a day (BID) | INTRAMUSCULAR | Status: DC
  Administered 2023-10-05: 100 mg via INTRAVENOUS
  Filled 2023-10-05: qty 2

## 2023-10-05 MED ORDER — FREE WATER
300.0000 mL | Status: DC
  Administered 2023-10-05: 300 mL

## 2023-10-06 NOTE — Progress Notes (Signed)
 Late entry, Patient time of death Oct 17, 2333, 2023-10-09, Confirmed by 2 RN's(Najat Olazabal,Ben Hermonat),  no heart rate and no respirations with asystole on cardiac monitor

## 2023-10-07 LAB — URINE CULTURE: Culture: NO GROWTH

## 2023-10-08 LAB — CULTURE, BLOOD (ROUTINE X 2)
Culture: NO GROWTH
Culture: NO GROWTH

## 2023-10-08 LAB — CULTURE, RESPIRATORY W GRAM STAIN

## 2023-10-08 LAB — CALCIUM, IONIZED: Calcium, Ionized, Serum: 5.3 mg/dL (ref 4.5–5.6)

## 2023-10-19 NOTE — Procedures (Signed)
 Extubation Procedure Note  Patient Details:   Name: [name] DOB: [birth year] MRN: [id number]   Airway Documentation:    Vent end date: 09/26/2023 Vent end time: 2307   Evaluation  O2 sats: currently acceptable Complications: No apparent complications Patient did tolerate procedure well. Bilateral Breath Sounds: Clear, Diminished   No Pt compassionately extubated. RT performed breathing trial of PS CPAP 5/5 maneuver with no patient effort (apnea) RN at bedside.    Paschal Dopp 10/14/2023, 11:12 PM

## 2023-10-19 NOTE — Procedures (Signed)
 Arterial Catheter Insertion Procedure Note  Ido Wollman  [id number]  22-Nov-1955  Date:09/24/2023  Time:9:12 PM    Provider Performing: Patrici Ranks    Procedure: Insertion of Arterial Line (40981) with US guidance (19147)   Indication(s) Blood pressure monitoring and/or need for frequent ABGs  Consent Unable to obtain consent due to emergent nature of procedure.  Anesthesia None   Time Out Verified patient identification, verified procedure, site/side was marked, verified correct patient position, special equipment/implants available, medications/allergies/relevant history reviewed, required imaging and test results available.   Sterile Technique Maximal sterile technique including full sterile barrier drape, hand hygiene, sterile gown, sterile gloves, mask, hair covering, sterile ultrasound probe cover (if used).   Procedure Description Area of catheter insertion was cleaned with chlorhexidine and draped in sterile fashion. With real-time ultrasound guidance an arterial catheter was placed into the left radial artery.  Appropriate arterial tracings confirmed on monitor.     Complications/Tolerance None; patient tolerated the procedure well.   EBL Minimal   Specimen(s) None

## 2023-10-19 NOTE — Progress Notes (Signed)
 eLink Physician-Brief Progress Note Patient Name: Ray Jackson DOB: 06-26-1956 MRN: [id number]   Date of Service  10/14/2023  HPI/Events of Note  I contacted Patient's sister Ray Jackson to inform of plans to proceed with comfort measures.  This was agreed upon earlier today.  Patient was being considered for organ donation which is no longer possible.  Thus we are now proceeding with focus on comfort.  Morphine and versed will be used as needed for comfort.  Vasopressors will be discontinued followed by extubation.  Sister stated she understands and does not plan to be at bedside tonight.  Family will be notified when patient pronounced deceased.  eICU Interventions       Intervention Category Major Interventions: End of life / care limitation discussion  Henry Russel, Demetrius Charity 10/18/2023, 10:28 PM

## 2023-10-19 NOTE — Progress Notes (Signed)
 RT x 2 attempted to place radial arterial line without success. RN notified.

## 2023-10-19 NOTE — Progress Notes (Signed)
 Pt was transported to CT via ventilator with no apparent complications. Pt is now back in ICU.

## 2023-10-19 NOTE — Procedures (Signed)
 Intraosseous Needle Insertion Procedure Note    Date:10/10/2023  Time:8:58 AM   Provider Performing:Chidera Dearcos   Procedure: Insertion Intraosseous (96295)  Indication(s) Medication administration  Consent Unable to obtain consent due to emergent nature of procedure.  Anesthesia Topical only with 1% lidocaine   Timeout Verified patient identification, verified procedure, site/side was marked, verified correct patient position, special equipment/implants available, medications/allergies/relevant history reviewed, required imaging and test results available.  Procedure Description Area of needle insertion was cleaned with chlorhexidine. Intraosseous needle was placed into the left tibia. Bone marrow was aspirated and site easily flushed. The needle was secured in place and dressing applied.  Complications/Tolerance None; patient tolerated the procedure well.  EBL Minimal

## 2023-10-19 NOTE — IPAL (Signed)
 Interdisciplinary Goals of Care Family Meeting   Date carried out:: 10/04/2023  Location of the meeting: Phone conference  Member's involved: Physician, Bedside Registered Nurse, and Family Member or next of kin  Durable Power of Attorney or acting medical decision maker: Marine scientist  Discussion: We discussed goals of care for [place] .    The Clinical status was relayed to patient's sister (legal guardian) over the phone in detail.   Updated and notified of patients medical condition.     Patient remains unresponsive and will not open eyes to command.   He has no cough or gag reflexes, which is a probable sign of severe anoxic brain injury postcardiac arrest He is developing severe septic shock, on multiple vasopressors but is still blood pressure is dropping Evidence of kidney failure Recommend follow up NEUROLOGY RECS   Patient with Progressive multiorgan failure with a very high probablity of a very minimal chance of meaningful recovery despite all aggressive and optimal medical therapy.  Code status: DNR  Disposition: In-patient comfort care    Family are satisfied with Plan of action and management. All questions answered   Cheri Fowler MD [facility] See Amion for pager If no response to pager, please call [phone] until 7pm After 7pm, Please call E-link [phone]

## 2023-10-19 NOTE — Death Summary Note (Signed)
 DEATH SUMMARY   Patient Details  Name: Ray Jackson MRN: [id number] DOB: 1955/12/23  Admission/Discharge Information   Admit Date:  10-24-23  Date of Death: Date of Death: 26-Oct-2023  Time of Death: Time of Death: 2333-11-03  Length of Stay: 3  Referring Physician: Anselm Jungling, NP   Reason(s) for Hospitalization  Status post PEA cardiac arrest likely in the setting of aspiration leading to hypoxia Acute encephalopathy, likely anoxic Acute hypoxemic and hypercarbic respiratory failure Severe sepsis with septic shock aspiration PNA, POA COPD, not in exacerbation Chronic HFpEF AKI on CKD stage 3a BPH Hypokalemia, resolved Hypophosphatemia Hyponatremia 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 Chronic anemia in the setting of vitamin B12 deficiency Schizophrenia Demand cardiac ischemia due to CPR Severe protein calorie malnutrition  Diagnoses  Preliminary cause of death: Withdrawal of care in the setting of multisystem organ failure and probable severe anoxic brain injury Secondary Diagnoses (including complications and co-morbidities):  Principal Problem:   Cardiac arrest Ray Jackson) Active Problems:   Protein-calorie malnutrition, severe   Brief Jackson Course (including significant findings, care, treatment, and services provided and events leading to death)  Ray Jackson is a 68 y.o. year old male who  presented to [facility] ED 10-24-23 via EMS for cardiac arrest. PMHx significant for HTN, COPD, chronic anemia 2/2 vitamin B12 deficiency, CKD stage 3a, BPH, schizophrenia (c/b lithium toxicity 10/2022), FTT/malnutrition.   History is obtained primarily from chart review and from EMS report. Patient is a resident of a group home and was found unresponsive by staff a few minutes after sitting down to eat dinner. CPR was initiated x 10 minutes with Epi administration x 2 and ROSC. Patient reportedly had pulses for ~25 minutes prior to arresting again en route with EMS. CPR reinitiated and Epi x 2 given by EMS. Patient was  intubated in ED with ROSC. Of note, copious food debris in patient's trachea was noted by EDP on intubation and by RN on OGT placement. Labs were notable for WBC 11.7, Hgb 10.8 (baseline ~12), Plt 233. INR 1.2. Chem-8 with Na 142, K 3.2, CO2 23, iCal 1.29, Cr 2.20 (baseline 1.6-1.8). LA 7.1 Initial head CT was negative.  Patient was admitted to ICU he was continued on broad-spectrum antibiotics, TPN protocol was continued.  Initially he had cough and gag but no pupillary or corneal reflexes but later on cough and gag reflexes went away.  He started getting hypotensive, requiring multiple vasopressor support despite being on antibiotic therapy.  Her electrolytes were supplemented.  Patient started having large volume of urine output, likely related to central DI from severe anoxic brain injury.  MRI brain was requested but considering patient with hemodynamic instability patient's family decided to proceed with comfort care and palliative extubation. [facility] evaluated the patient, recommend not a candidate for organ donation.  Patient was palliatively extubated and he passed on 2023/10/26 at 11:35 PM.  Patient's family was called and notified   Pertinent Labs and Studies  Significant Diagnostic Studies CT CHEST ABDOMEN PELVIS WO CONTRAST Result Date: 10/26/2023 CLINICAL DATA:  Evaluate for organ donation. EXAM: CT CHEST, ABDOMEN AND PELVIS WITHOUT CONTRAST TECHNIQUE: Multidetector CT imaging of the chest, abdomen and pelvis was performed following the standard protocol without IV contrast. RADIATION DOSE REDUCTION: This exam was performed according to the departmental dose-optimization program which includes automated exposure control, adjustment of the mA and/or kV according to patient size and/or use of iterative reconstruction technique. COMPARISON:  Radiograph earlier today FINDINGS: CT CHEST FINDINGS Cardiovascular: The heart is normal in  size. There are coronary artery calcifications. Moderate aortic  atherosclerosis without aneurysm. No significant pericardial effusion. Mediastinum/Nodes: Enteric tube decompresses the esophagus. The endotracheal tube tip is in the midtrachea. Shotty mediastinal lymph nodes, including 12 mm anterior paratracheal node, series 3, image 33. No thyroid nodule. Lungs/Pleura: Mild to moderate apical predominant emphysema. Small to moderate left and small right pleural effusion. Subtotal collapse/consolidation throughout the left lower lobe. Nodular airspace disease in the dependent left upper lobe, dependent right upper lobe, and dependent right lower lobe. Bull[place] in the lateral right lower hemithorax. No definite endobronchial debris. Musculoskeletal: Buckle fractures of multiple bilateral anterior ribs typical of CPR. There is also a displaced fracture of the right lateral fourth rib. Mild degenerative change in the thoracic spine. No suspicious bone lesion. CT ABDOMEN PELVIS FINDINGS Hepatobiliary: The liver measures 16.7 cm in length 1 measuring the right hepatic lobe. There is no evidence of focal liver lesion on this unenhanced exam. Mild periportal edema. A layering sludge in the gallbladder. No calcified gallstone. Pancreas: Mild parenchymal atrophy. No evidence of focal lesion. Not well assessed in the absence of IV contrast. Spleen: Normal in size without focal abnormality. Adrenals/Urinary Tract: No adrenal nodule. Subjective bilateral renal atrophy. The right kidney measures 9.6 x 3.7 x 3.7 cm. The left kidney measures 9.7 x 4.1 x 3.8 cm. Simple appearing cyst in the lower right kidney. No evidence of solid renal lesion. Foley catheter decompresses the urinary bladder. There is mild diffuse bladder wall thickening. Stomach/Bowel: Enteric tube tip in the stomach. The stomach is prominently distended with fluid. Majority of small bowel is decompressed. Small to moderate volume of stool in the colon. No evident colonic wall thickening. Sigmoid diverticulosis without  diverticulitis. Vascular/Lymphatic: Moderate aortic atherosclerosis, no aneurysm. No bulky abdominopelvic adenopathy. Reproductive: Prostate is unremarkable. Other: Small volume abdominopelvic ascites. Generalized edema of the intra-abdominal and subcutaneous fat, with confluent edema in the flanks. Small bilateral fat containing inguinal hernias. Musculoskeletal: There are no acute or suspicious osseous abnormalities. Multilevel spondylosis in the thoracic spine. IMPRESSION: 1. Mild-moderate emphysema. 2. Small to moderate left and small right pleural effusion. 3. Subtotal collapse/consolidation throughout the left lower lobe. Nodular airspace disease throughout the remaining lungs. 4. Bilateral renal parenchymal atrophy. Pancreatic parenchymal atrophy. 5. Small volume abdominopelvic ascites. Generalized body wall edema typical of anasarca. 6. Thoracoabdominal aortic atherosclerosis. Coronary artery calcifications. 7. Electronically Signed   By: Narda Rutherford M.D.   On: 09/30/2023 19:15   DG CHEST PORT 1 VIEW Result Date: 09/24/2023 CLINICAL DATA:  [id number] Acute hypercapnic respiratory failure (HCC) [id number] EXAM: PORTABLE CHEST 1 VIEW COMPARISON:  October 03, 2023 FINDINGS: The cardiomediastinal silhouette is unchanged in contour.ETT tip terminates 4.2 cm above the carina. Enteric tube side port projects over stomach. Similar LEFT pleural blunting. No pneumothorax. Mildly increased bibasilar interstitial prominence. IMPRESSION: 1. ETT tip terminates 4.2 cm above the carina. 2. Increased bibasilar interstitial prominence, favored to reflect pulmonary edema. Electronically Signed   By: Meda Klinefelter M.D.   On: 10/09/2023 10:47   ECHOCARDIOGRAM COMPLETE Result Date: 10/04/2023    ECHOCARDIOGRAM REPORT   Patient Name:   AQUARIUS LATOUCHE Date of Exam: 10/04/2023 Medical Rec #:  [id number]  Height:       69.0 in Accession #:    [id number] Weight:       143.7 lb Date of Birth:  1956/06/08  BSA:          1.795 m  Patient Age:    68 years   BP:  132/86 mmHg Patient Gender: M          HR:           91 bpm. Exam Location:  Inpatient Procedure: 2D Echo, Color Doppler and Cardiac Doppler (Both Spectral and Color            Flow Doppler were utilized during procedure). Indications:    Cardiomyopathy-Unspecified I42.9  History:        Patient has no prior history of Echocardiogram examinations.                 COPD; Risk Factors:Hypertension.  Sonographer:    Webb Laws Referring Phys: [id number] OMAR M ALBUSTAMI  Sonographer Comments: Only Subcostal window IMPRESSIONS  1. Biventricular hypertrophy. Findings concerning for infiltrative cardiomyopathy. Consider cardiac MRI.  2. Left ventricular ejection fraction, by estimation, is 60 to 65%. The left ventricle has normal function. The left ventricle has no regional wall motion abnormalities. There is moderate concentric left ventricular hypertrophy. Left ventricular diastolic parameters are consistent with Grade II diastolic dysfunction (pseudonormalization).  3. Right ventricular systolic function is normal. The right ventricular size is normal. Moderately increased right ventricular wall thickness. There is mildly elevated pulmonary artery systolic pressure.  4. A small pericardial effusion is present. The pericardial effusion is localized near the right ventricle.  5. The mitral valve is normal in structure. Trivial mitral valve regurgitation. No evidence of mitral stenosis.  6. The aortic valve is tricuspid. Aortic valve regurgitation is not visualized. No aortic stenosis is present.  7. The inferior vena cava is dilated in size with >50% respiratory variability, suggesting right atrial pressure of 8 mmHg. FINDINGS  Left Ventricle: Left ventricular ejection fraction, by estimation, is 60 to 65%. The left ventricle has normal function. The left ventricle has no regional wall motion abnormalities. Strain imaging was not performed. The left ventricular internal cavity   size was normal in size. There is moderate concentric left ventricular hypertrophy. Left ventricular diastolic parameters are consistent with Grade II diastolic dysfunction (pseudonormalization). Indeterminate filling pressures. Right Ventricle: The right ventricular size is normal. Moderately increased right ventricular wall thickness. Right ventricular systolic function is normal. There is mildly elevated pulmonary artery systolic pressure. The tricuspid regurgitant velocity is 2.96 m/s, and with an assumed right atrial pressure of 8 mmHg, the estimated right ventricular systolic pressure is 43.0 mmHg. Left Atrium: Left atrial size was normal in size. Right Atrium: Right atrial size was normal in size. Pericardium: A small pericardial effusion is present. The pericardial effusion is localized near the right ventricle. Mitral Valve: The mitral valve is normal in structure. Trivial mitral valve regurgitation. No evidence of mitral valve stenosis. Tricuspid Valve: The tricuspid valve is normal in structure. Tricuspid valve regurgitation is mild . No evidence of tricuspid stenosis. Aortic Valve: The aortic valve is tricuspid. Aortic valve regurgitation is not visualized. No aortic stenosis is present. Pulmonic Valve: The pulmonic valve was normal in structure. Pulmonic valve regurgitation is not visualized. No evidence of pulmonic stenosis. Aorta: The aortic root is normal in size and structure. Venous: The inferior vena cava is dilated in size with greater than 50% respiratory variability, suggesting right atrial pressure of 8 mmHg. IAS/Shunts: No atrial level shunt detected by color flow Doppler. Additional Comments: 3D imaging was not performed.  LEFT VENTRICLE PLAX 2D LVIDd:         4.30 cm     Diastology LVIDs:         2.90 cm  LV e' medial:    9.17 cm/s LV PW:         1.05 cm     LV E/e' medial:  8.4 LV IVS:        1.15 cm     LV e' lateral:   9.17 cm/s LVOT diam:     1.95 cm     LV E/e' lateral: 8.4 LV SV:          52 LV SV Index:   29 LVOT Area:     2.99 cm  LV Volumes (MOD) LV vol d, MOD A4C: 95.3 ml LV vol s, MOD A4C: 35.7 ml LV SV MOD A4C:     95.3 ml RIGHT VENTRICLE             IVC RV Basal diam:  3.40 cm     IVC diam: 2.50 cm RV S prime:     23.20 cm/s LEFT ATRIUM             Index        RIGHT ATRIUM           Index LA diam:        2.40 cm 1.34 cm/m   RA Area:     18.00 cm LA Vol (A2C):   13.5 ml 7.52 ml/m   RA Volume:   49.30 ml  27.46 ml/m LA Vol (A4C):   51.0 ml 28.41 ml/m LA Biplane Vol: 28.4 ml 15.82 ml/m  AORTIC VALVE LVOT Vmax:   132.00 cm/s LVOT Vmean:  82.200 cm/s LVOT VTI:    0.173 m  AORTA Ao Root diam: 3.30 cm MITRAL VALVE               TRICUSPID VALVE MV Area (PHT): 2.91 cm    TR Peak grad:   35.0 mmHg MV Decel Time: 261 msec    TR Vmax:        296.00 cm/s MV E velocity: 76.70 cm/s MV A velocity: 66.00 cm/s  SHUNTS MV E/A ratio:  1.16        Systemic VTI:  0.17 m                            Systemic Diam: 1.95 cm Chilton Si MD Electronically signed by Chilton Si MD Signature Date/Time: 10/04/2023/11:24:28 AM    Final    CT HEAD WO CONTRAST Result Date: 10/03/2023 CLINICAL DATA:  Mental status change cardiac arrest EXAM: CT HEAD WITHOUT CONTRAST TECHNIQUE: Contiguous axial images were obtained from the base of the skull through the vertex without intravenous contrast. RADIATION DOSE REDUCTION: This exam was performed according to the departmental dose-optimization program which includes automated exposure control, adjustment of the mA and/or kV according to patient size and/or use of iterative reconstruction technique. COMPARISON:  None Available. FINDINGS: Brain: No acute territorial infarction, hemorrhage, or intracranial mass. Mild chronic small vessel ischemic changes of the white matter. Nonenlarged ventricles Vascular: No hyperdense vessels.  Carotid vascular calcification Skull: Normal. Negative for fracture or focal lesion. Sinuses/Orbits: Mucosal thickening in the sinuses.  Moderate fluid and debris in the posterior nasal pharynx and the oropharynx. Other: None IMPRESSION: 1. No CT evidence for acute intracranial abnormality. 2. Mild chronic small vessel ischemic changes of the white matter. 3. Moderate fluid and debris in the posterior nasal pharynx and oropharynx. Electronically Signed   By: Jasmine Pang M.D.   On: 10/03/2023 20:42   DG Abd Portable 1V  Result Date: 10/03/2023 CLINICAL DATA:  Enteric catheter placement EXAM: PORTABLE ABDOMEN - 1 VIEW COMPARISON:  10/03/2023 FINDINGS: Supine frontal view of the abdomen and pelvis excludes the right hemidiaphragm by collimation. Enteric catheter passes below diaphragm, tip overlying gastric body with side port projecting in the region of the gastroesophageal junction. Moderate gaseous distention of the stomach. No bowel obstruction or ileus. No masses or abnormal calcifications. IMPRESSION: 1. Enteric catheter tip projecting over gastric body, with side port projecting at the gastroesophageal junction. 2. Moderate gaseous distention of the stomach, likely due to recent intubation and resuscitation efforts. 3. No bowel obstruction or ileus. Electronically Signed   By: Sharlet Salina M.D.   On: 10/03/2023 20:28   DG Chest Portable 1 View Result Date: 10/03/2023 CLINICAL DATA:  Posterior arrest EXAM: PORTABLE CHEST 1 VIEW COMPARISON:  10/22/2022 FINDINGS: Endotracheal tube tip about 5.7 cm superior to the carina. Enteric tube tip below the diaphragm but incompletely visualized. No consolidation or effusion. Stable cardiomediastinal silhouette. No pneumothorax. Emphysema. Mild diffuse increased interstitial opacity in the upper lobes. Old right-sided rib fracture IMPRESSION: 1. Endotracheal tube tip about 5.7 cm superior to the carina. Enteric tube tip below the diaphragm but incompletely visualized. 2. Emphysema. Mild diffuse increased interstitial opacity in the upper lobes, question mild noncardiogenic edema or  infection/inflammation. Electronically Signed   By: Jasmine Pang M.D.   On: 10/03/2023 20:04    Microbiology Recent Results (from the past 240 hours)  Blood culture (routine x 2)     Status: None (Preliminary result)   Collection Time: 10/03/23  7:48 PM   Specimen: BLOOD  Result Value Ref Range Status   Specimen Description BLOOD BLOOD LEFT FOREARM  Final   Special Requests   Final    BOTTLES DRAWN AEROBIC AND ANAEROBIC Blood Culture results may not be optimal due to an inadequate volume of blood received in culture bottles   Culture   Final    NO GROWTH 3 DAYS Performed at [facility], [address], [place], [place] [address]    Report Status PENDING  Incomplete  Blood culture (routine x 2)     Status: None (Preliminary result)   Collection Time: 10/03/23  7:54 PM   Specimen: BLOOD LEFT WRIST  Result Value Ref Range Status   Specimen Description BLOOD LEFT WRIST  Final   Special Requests   Final    BOTTLES DRAWN AEROBIC AND ANAEROBIC Blood Culture results may not be optimal due to an inadequate volume of blood received in culture bottles   Culture   Final    NO GROWTH 3 DAYS Performed at [facility], [address] [place], [place] [address]    Report Status PENDING  Incomplete  MRSA Next Gen by PCR, Nasal     Status: None   Collection Time: 10/03/23  9:01 PM   Specimen: Nasal Mucosa; Nasal Swab  Result Value Ref Range Status   MRSA by PCR Next Gen NOT DETECTED NOT DETECTED Final    Comment: (NOTE) The GeneXpert MRSA Assay (FDA approved for NASAL specimens only), is one component of a comprehensive MRSA colonization surveillance program. It is not intended to diagnose MRSA infection nor to guide or monitor treatment for MRSA infections. Test performance is not FDA approved in patients less than 23 years old. Performed at [facility], [address] [place], [place] [address]   Culture, Respiratory w Gram Stain     Status: None (Preliminary result)  Collection Time: 10/09/2023  8:50 AM   Specimen: Tracheal Aspirate; Respiratory  Result Value Ref Range Status   Specimen Description TRACHEAL ASPIRATE  Final   Special Requests NONE  Final   Gram Stain   Final    RARE SQUAMOUS EPITHELIAL CELLS PRESENT ABUNDANT GRAM POSITIVE COCCI MODERATE GRAM VARIABLE ROD WBC PRESENT, PREDOMINANTLY PMN    Culture   Final    ABUNDANT STAPHYLOCOCCUS AUREUS SUSCEPTIBILITIES TO FOLLOW Performed at [facility], [address] [place], [place] [address]    Report Status PENDING  Incomplete  SARS Coronavirus 2 by RT PCR ([facility] order, performed in [facility] lab) *cepheid single result test* Anterior Nasal Swab     Status: None   Collection Time: 09/28/2023  8:10 PM   Specimen: Anterior Nasal Swab  Result Value Ref Range Status   SARS Coronavirus 2 by RT PCR NEGATIVE NEGATIVE Final    Comment: Performed at [facility], [address], [place], [place] [address]    Lab Basic Metabolic Panel: Recent Labs  Lab 10/03/23 1913 10/03/23 1923 10/03/23 1939 10/03/23 2014 10/04/23 1039 10/04/23 1654 10/10/2023 0441 10/14/2023 0914 10/17/2023 1747 09/26/2023 1754 10/12/2023 2124  NA 142 143  142  --    < >  --  145 152* 157* 153* 154* 153*  K 3.4* 3.3*  3.2*  --    < >  --  4.8 3.7 3.0* 3.8 4.0 4.7  CL 108 107  --   --   --  112* 119*  --   --  116*  --   CO2 20*  --   --   --   --  24 26  --   --  25  --   GLUCOSE 238* 228*  --   --   --  110* 101*  --   --  105*  --   BUN 15 19  --   --   --  25* 27*  --   --  27*  --   CREATININE 2.23* 2.20*  --   --   --  2.47* 2.47*  --   --  2.60*  --   CALCIUM 9.9  --   --   --   --  9.2 9.2  --   --  9.2  --   MG  --   --  2.2  --  1.8 2.4 2.5*  --   --  2.0  --   PHOS  --   --  6.5*  --  1.2* 1.8* 2.4*  --   --  4.4  --    < > = values in this interval not displayed.   Liver Function Tests: Recent Labs  Lab 10/03/23 1913 09/21/2023 1754  AST 136* 49*  ALT 98* 76*  ALKPHOS 105 77   BILITOT 0.7 0.6  PROT 4.8* 5.0*  ALBUMIN 2.5* 2.0*   Recent Labs  Lab 10/03/23 1913  LIPASE 32   No results for input(s): "AMMONIA" in the last 168 hours. CBC: Recent Labs  Lab 10/03/23 1913 10/03/23 1923 10/04/23 0255 10/04/23 0333 10/10/2023 0441 10/14/2023 0914 10/16/2023 1747 09/21/2023 1754 09/23/2023 2124  WBC 11.7*  --  11.1*  --  6.4  --   --  10.1  --   NEUTROABS 5.8  --   --   --  4.7  --   --  8.6*  --   HGB 10.8*   < >  11.9*   < > 10.8* 10.5* 12.2* 12.5* 12.2*  HCT 36.7*   < > 36.6*   < > 33.0* 31.0* 36.0* 40.5 36.0*  MCV 109.2*  --  98.4  --  98.8  --   --  103.6*  --   PLT 233  --  232  --  206  --   --  232  --    < > = values in this interval not displayed.   Cardiac Enzymes: No results for input(s): "CKTOTAL", "CKMB", "CKMBINDEX", "TROPONINI" in the last 168 hours. Sepsis Labs: Recent Labs  Lab 10/03/23 1913 10/03/23 1923 10/03/23 1939 10/04/23 0255 10/01/2023 0441 09/23/2023 1754  PROCALCITON  --   --  0.10  --   --   --   WBC 11.7*  --   --  11.1* 6.4 10.1  LATICACIDVEN  --  7.1*  --   --   --   --     Procedures/Operations     Harjot Dibello 10/06/2023, 1:29 PM

## 2023-10-19 NOTE — Progress Notes (Addendum)
 eLink Physician-Brief Progress Note Patient Name: Ray Jackson DOB: Jun 05, 1956 MRN: [id number]   Date of Service  10/07/2023  HPI/Events of Note  Levo weaned off 2/14 and BP now 73/57 (63  eICU Interventions  LR bolus, if ineffective, start NE   0519 -refractory hypotension, norepinephrine reaching maximal peripheral dosing.  Will extend the peripheral dosing range.  Additionally, attempt additional 500 cc LR.  If nor epi requirement exceeds 15 mcg, initiate vasopressin.  Anticipate he will need central line this morning.  Intervention Category Intermediate Interventions: Hypotension - evaluation and management  [name] 10/12/2023, 2:23 AM

## 2023-10-19 NOTE — Progress Notes (Addendum)
 NAME:  Ray Jackson, MRN:  [id number], DOB:  September 06, 1955, LOS: 2 ADMISSION DATE:  10/03/2023 CONSULTATION DATE:  10/03/2023 REFERRING MD:  Lockie Mola - EDP, CHIEF COMPLAINT:  Cardiac arrest   History of Present Illness:  68 year old man who presented to [facility] ED 2/13 via EMS for cardiac arrest. PMHx significant for HTN, COPD, chronic anemia 2/2 vitamin B12 deficiency, CKD stage 3a, BPH, schizophrenia (c/b lithium toxicity 10/2022), FTT/malnutrition.  History is obtained primarily from chart review and from EMS report. Patient is a resident of a group home and was found unresponsive by staff a few minutes after sitting down to eat dinner. CPR was initiated x 10 minutes with Epi administration x 2 and ROSC. Patient reportedly had pulses for ~25 minutes prior to arresting again en route with EMS. CPR reinitiated and Epi x 2 given by EMS. Patient was intubated in ED with ROSC. Of note, copious food debris in patient's trachea was noted by EDP on intubation and by RN on OGT placement. Labs were notable for WBC 11.7, Hgb 10.8 (baseline ~12), Plt 233. INR 1.2. Chem-8 with Na 142, K 3.2, CO2 23, iCal 1.29, Cr 2.20 (baseline 1.6-1.8). LA 7.1. Imaging pending.  PCCM consulted for ICU admission.  Pertinent Medical History:   Past Medical History:  Diagnosis Date   ARF (acute renal failure) (HCC) 10/02/2017   BPH (benign prostatic hyperplasia)    Constipation    COPD (chronic obstructive pulmonary disease) (HCC)    Dyspnea    Hypertension    Hypokalemia    Hyponatremia 10/02/2017   Noncompliance 11/05/2017   Schizophrenia (HCC)    Significant Hospital Events: Including procedures, antibiotic start and stop dates in addition to other pertinent events   2/13 - Presented to [facility] ED via EMS as a CPR in progress. CPR x 10 minutes (Epi x 2) in the field with ROSC. Pulses x 25 minutes then lost again en route to ED. Additional CPR + Epi x 2 by EMS. Intubated on ED arrival.  Interim History / Subjective:  Patient  started getting hypotensive overnight, requiring increasing vasopressor support, currently on Levophed at 16 He is afebrile, white count is trending down Continue to have thick respiratory secretions  Objective:  Blood pressure (!) 81/56, pulse 72, temperature (!) 95.4 F (35.2 C), resp. rate (!) 28, height 5\' 9"  (1.753 m), weight 65.2 kg, SpO2 92%.    Vent Mode: PRVC FiO2 (%):  [50 %-60 %] 60 % Set Rate:  [28 bmp] 28 bmp Vt Set:  [560 mL] 560 mL PEEP:  [5 cmH20] 5 cmH20 Plateau Pressure:  [14 cmH20-16 cmH20] 14 cmH20   Intake/Output Summary (Last 24 hours) at 10/09/2023 0848 Last data filed at 10/01/2023 0800 Gross per 24 hour  Intake 4606.19 ml  Output 4110 ml  Net 496.19 ml   Filed Weights   10/03/23 2042 10/04/23 0259  Weight: 65.2 kg 65.2 kg    Physical Examination: General: Crtitically ill-appearing elderly male, orally intubated HEENT: [place]/AT, eyes anicteric.  ETT and OGT in place Neuro: Eyes closed, does not open, not following commands, pupils mid dilated, nonreactive to light, absent corneal, cough and gag reflexes.  Not breathing over the vent Chest: Bilateral coarse crackles, no wheezes or rhonchi Heart: Regular rate and rhythm, no murmurs or gallops Abdomen: Soft, nondistended, bowel sounds present Skin: No rash   Labs and images reviewed  Resolved Hospital Problem List:    Assessment & Plan:  Status post PEA cardiac arrest likely in the setting of  aspiration leading to hypoxia Continue TTM protocol Continue supportive care Continue telemetry monitoring Unfortunately patient has lost all brainstem reflexes Echocardiogram showed EF 60 to 65% with normal motion abnormalities Initial head CT is negative for acute findings Will get MRI brain  Acute encephalopathy, likely anoxic Patient is off sedation, with absent brainstem reflexes Yesterday he had cough reflex which is gone now Will get MRI brain today   Acute hypoxemic and hypercarbic respiratory  failure Severe sepsis with septic shock aspiration PNA, POA COPD Continue lung protective ventilation VAP prevention bundle in place PAD protocol, he is off sedation Hypercapnia has cleared FiO2 has increased to 60% He is still requiring vasopressor support due to hypotension, currently on 16 mics of Levophed Switch antibiotic to Unasyn Follow-up respiratory culture Start vasopressin Continue inhalers  Chronic HFpEF Echocardiogram showed EF 60 to 65% with grade 2 diastolic dysfunction He looks hypovolemic Will give him 1 L IV IV fluid Monitor intake and output  AKI on CKD stage 3a BPH Hypokalemia, resolved Hypophosphatemia Patient baseline serum creatinine is around 1.6 His serum creatinine remained elevated Monitor intake and output Avoid nephrotoxic agent Bolus IV fluid  Chronic anemia in the setting of vitamin B12 deficiency Monitor H&H and transfuse if less than 7  Schizophrenia Resident of a group home; patient has a legal guardian Advice worker). Will restart lithium, hold other antipsychotic meds  Demand cardiac ischemia due to CPR Echocardiogram showed no wall motion abnormalities  Severe protein calorie malnutrition Continue tube feeds Continue dietary supplements Dietitian is following   Best Practice: (right click and "Reselect all SmartList Selections" daily)   Diet/type: Tube feeds DVT prophylaxis: Subcu heparin GI prophylaxis: PPI Lines: N/A,  Foley:  Yes, and it is still needed Code Status: DNR Last date of multidisciplinary goals of care discussion [2/14: Patient has legal guardian Marine scientist, updated at bedside, decision was to keep him DNR and continue full scope of care at this time]  Labs:  CBC: Recent Labs  Lab 10/03/23 1913 10/03/23 1923 10/03/23 2014 10/04/23 0255 10/04/23 0333 10/01/2023 0441  WBC 11.7*  --   --  11.1*  --  6.4  NEUTROABS 5.8  --   --   --   --  4.7  HGB 10.8* 10.9*  11.2* 9.9* 11.9* 11.6* 10.8*  HCT 36.7*  32.0*  33.0* 29.0* 36.6* 34.0* 33.0*  MCV 109.2*  --   --  98.4  --  98.8  PLT 233  --   --  232  --  206   Basic Metabolic Panel: Recent Labs  Lab 10/03/23 1913 10/03/23 1923 10/03/23 1939 10/03/23 2014 10/04/23 0333 10/04/23 1039 10/04/23 1654 10/04/2023 0441  NA 142 143  142  --  143 144  --  145 152*  K 3.4* 3.3*  3.2*  --  3.2* 3.6  --  4.8 3.7  CL 108 107  --   --   --   --  112* 119*  CO2 20*  --   --   --   --   --  24 26  GLUCOSE 238* 228*  --   --   --   --  110* 101*  BUN 15 19  --   --   --   --  25* 27*  CREATININE 2.23* 2.20*  --   --   --   --  2.47* 2.47*  CALCIUM 9.9  --   --   --   --   --  9.2 9.2  MG  --   --  2.2  --   --  1.8 2.4 2.5*  PHOS  --   --  6.5*  --   --  1.2* 1.8* 2.4*   GFR: Estimated Creatinine Clearance: 26.4 mL/min (A) (by C-G formula based on SCr of 2.47 mg/dL (H)). Recent Labs  Lab 10/03/23 1913 10/03/23 1923 10/03/23 1939 10/04/23 0255 10/17/2023 0441  PROCALCITON  --   --  0.10  --   --   WBC 11.7*  --   --  11.1* 6.4  LATICACIDVEN  --  7.1*  --   --   --    Liver Function Tests: Recent Labs  Lab 10/03/23 1913  AST 136*  ALT 98*  ALKPHOS 105  BILITOT 0.7  PROT 4.8*  ALBUMIN 2.5*   Recent Labs  Lab 10/03/23 1913  LIPASE 32   No results for input(s): "AMMONIA" in the last 168 hours.  ABG:    Component Value Date/Time   PHART 7.414 10/04/2023 0333   PCO2ART 37.9 10/04/2023 0333   PO2ART 99 10/04/2023 0333   HCO3 24.3 10/04/2023 0333   TCO2 25 10/04/2023 0333   ACIDBASEDEF 4.0 (H) 10/03/2023 2014   O2SAT 98 10/04/2023 0333    Coagulation Profile: Recent Labs  Lab 10/03/23 1913  INR 1.2   Cardiac Enzymes: No results for input(s): "CKTOTAL", "CKMB", "CKMBINDEX", "TROPONINI" in the last 168 hours.  HbA1C: Hemoglobin A1C  Date/Time Value Ref Range Status  04/06/2014 06:36 AM 5.1 4.2 - 6.3 % Final    Comment:    The American Diabetes Association recommends that a primary goal of therapy should be <7% and  that physicians should reevaluate the treatment regimen in patients with HbA1c values consistently >8%.    Hgb A1c MFr Bld  Date/Time Value Ref Range Status  10/03/2023 07:52 PM 4.9 4.8 - 5.6 % Final    Comment:    (NOTE) Pre diabetes:          5.7%-6.4%  Diabetes:              >6.4%  Glycemic control for   <7.0% adults with diabetes   11/06/2017 07:20 AM 4.5 (L) 4.8 - 5.6 % Final    Comment:    (NOTE) Pre diabetes:          5.7%-6.4% Diabetes:              >6.4% Glycemic control for   <7.0% adults with diabetes    CBG: Recent Labs  Lab 10/04/23 0805 10/04/23 1146 10/04/23 1634 10/04/23 2013 09/22/2023 0323  GLUCAP 164* 88 100* 105* 96    The patient is critically ill due to status post PEA cardiac arrest.  Critical care was necessary to treat or prevent imminent or life-threatening deterioration.  Critical care was time spent personally by me on the following activities: development of treatment plan with patient and/or surrogate as well as nursing, discussions with consultants, evaluation of patient's response to treatment, examination of patient, obtaining history from patient or surrogate, ordering and performing treatments and interventions, ordering and review of laboratory studies, ordering and review of radiographic studies, pulse oximetry, re-evaluation of patient's condition and participation in multidisciplinary rounds.   During this encounter critical care time was devoted to patient care services described in this note for .     Cheri Fowler, MD [facility] See Amion for pager If no response to pager, please call [phone] until 7pm After 7pm,  Please call E-link [phone]

## 2023-10-19 NOTE — Progress Notes (Addendum)
 Pharmacy Antibiotic Note  Ray Jackson is a 68 y.o. male admitted on 10/03/2023 with PEA arrest. Pharmacy has been consulted for vancomycin and Zosyn dosing. Cr up from baseline.  Plan: Zosyn 3.375g IV EI q8h Vancomycin 1250mg  IV q48h - est AUC 479 If Cr worsens will need vancomycin random prior to redosing  Height: 5\' 9"  (175.3 cm) Weight: 65.2 kg (143 lb 11.8 oz) IBW/kg (Calculated) : 70.7  Temp (24hrs), Avg:97.8 F (36.6 C), Min:94.3 F (34.6 C), Max:98.8 F (37.1 C)  Recent Labs  Lab 10/03/23 1913 10/03/23 1923 10/04/23 0255 10/04/23 1654 09/23/2023 0441  WBC 11.7*  --  11.1*  --  6.4  CREATININE 2.23* 2.20*  --  2.47* 2.47*  LATICACIDVEN  --  7.1*  --   --   --     Estimated Creatinine Clearance: 26.4 mL/min (A) (by C-G formula based on SCr of 2.47 mg/dL (H)).    No Known Allergies  Fredonia Highland, PharmD, BCPS, [facility] Clinical Pharmacist [phone] Please check AMION for all [facility] Pharmacy numbers 10/15/2023

## 2023-10-19 DEATH — deceased

## 2024-01-02 ENCOUNTER — Ambulatory Visit: Payer: Medicare Other | Admitting: Podiatry
